# Patient Record
Sex: Male | Born: 1980 | Race: White | Hispanic: No | Marital: Single | Smoking: Never smoker
Health system: Southern US, Community
[De-identification: ages and names within clinical notes are randomized; demographics above are authoritative.]

## PROBLEM LIST (undated history)

## (undated) DIAGNOSIS — Z87442 Personal history of urinary calculi: Secondary | ICD-10-CM

## (undated) DIAGNOSIS — D649 Anemia, unspecified: Secondary | ICD-10-CM

## (undated) DIAGNOSIS — Z8719 Personal history of other diseases of the digestive system: Secondary | ICD-10-CM

## (undated) DIAGNOSIS — Z803 Family history of malignant neoplasm of breast: Secondary | ICD-10-CM

## (undated) DIAGNOSIS — T7840XA Allergy, unspecified, initial encounter: Secondary | ICD-10-CM

## (undated) DIAGNOSIS — Z800 Family history of malignant neoplasm of digestive organs: Secondary | ICD-10-CM

## (undated) DIAGNOSIS — N200 Calculus of kidney: Secondary | ICD-10-CM

## (undated) DIAGNOSIS — Z8049 Family history of malignant neoplasm of other genital organs: Secondary | ICD-10-CM

## (undated) DIAGNOSIS — I10 Essential (primary) hypertension: Secondary | ICD-10-CM

## (undated) DIAGNOSIS — M109 Gout, unspecified: Secondary | ICD-10-CM

## (undated) DIAGNOSIS — D509 Iron deficiency anemia, unspecified: Secondary | ICD-10-CM

## (undated) DIAGNOSIS — Z808 Family history of malignant neoplasm of other organs or systems: Secondary | ICD-10-CM

## (undated) DIAGNOSIS — K219 Gastro-esophageal reflux disease without esophagitis: Secondary | ICD-10-CM

## (undated) DIAGNOSIS — K2270 Barrett's esophagus without dysplasia: Secondary | ICD-10-CM

## (undated) DIAGNOSIS — F419 Anxiety disorder, unspecified: Secondary | ICD-10-CM

## (undated) DIAGNOSIS — E669 Obesity, unspecified: Secondary | ICD-10-CM

## (undated) DIAGNOSIS — K5790 Diverticulosis of intestine, part unspecified, without perforation or abscess without bleeding: Secondary | ICD-10-CM

## (undated) HISTORY — DX: Calculus of kidney: N20.0

## (undated) HISTORY — DX: Iron deficiency anemia, unspecified: D50.9

## (undated) HISTORY — PX: CERVICAL FUSION: SHX112

## (undated) HISTORY — PX: BARIATRIC SURGERY: SHX1103

## (undated) HISTORY — DX: Family history of malignant neoplasm of other genital organs: Z80.49

## (undated) HISTORY — PX: UPPER GASTROINTESTINAL ENDOSCOPY: SHX188

## (undated) HISTORY — DX: Family history of malignant neoplasm of breast: Z80.3

## (undated) HISTORY — DX: Family history of malignant neoplasm of other organs or systems: Z80.8

## (undated) HISTORY — DX: Anemia, unspecified: D64.9

## (undated) HISTORY — DX: Barrett's esophagus without dysplasia: K22.70

## (undated) HISTORY — DX: Allergy, unspecified, initial encounter: T78.40XA

## (undated) HISTORY — DX: Family history of malignant neoplasm of digestive organs: Z80.0

## (undated) HISTORY — PX: CHOLECYSTECTOMY: SHX55

## (undated) HISTORY — PX: LASIK: SHX215

---
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
## 2005-06-02 ENCOUNTER — Encounter (HOSPITAL_COMMUNITY): Admission: RE | Admit: 2005-06-02 | Discharge: 2005-07-02 | Payer: Self-pay | Admitting: Internal Medicine

## 2006-06-15 ENCOUNTER — Ambulatory Visit (HOSPITAL_COMMUNITY): Admission: RE | Admit: 2006-06-15 | Discharge: 2006-06-15 | Payer: Self-pay | Admitting: Internal Medicine

## 2007-08-29 ENCOUNTER — Emergency Department: Payer: Self-pay | Admitting: Emergency Medicine

## 2007-12-27 ENCOUNTER — Emergency Department: Payer: Self-pay | Admitting: Emergency Medicine

## 2008-01-07 ENCOUNTER — Ambulatory Visit: Payer: Self-pay | Admitting: Urology

## 2008-01-13 ENCOUNTER — Ambulatory Visit: Payer: Self-pay | Admitting: Urology

## 2008-01-25 ENCOUNTER — Ambulatory Visit: Payer: Self-pay | Admitting: Urology

## 2008-03-20 ENCOUNTER — Ambulatory Visit: Payer: Self-pay | Admitting: Internal Medicine

## 2008-08-14 ENCOUNTER — Inpatient Hospital Stay: Payer: Self-pay | Admitting: Surgery

## 2009-01-30 ENCOUNTER — Ambulatory Visit: Payer: Self-pay | Admitting: Urology

## 2009-02-03 ENCOUNTER — Emergency Department: Payer: Self-pay | Admitting: Emergency Medicine

## 2009-02-11 ENCOUNTER — Observation Stay: Payer: Self-pay | Admitting: Internal Medicine

## 2009-02-27 ENCOUNTER — Ambulatory Visit: Payer: Self-pay | Admitting: Cardiology

## 2009-06-14 ENCOUNTER — Ambulatory Visit: Payer: Self-pay | Admitting: Urology

## 2009-10-20 DIAGNOSIS — Z22322 Carrier or suspected carrier of Methicillin resistant Staphylococcus aureus: Secondary | ICD-10-CM

## 2009-10-20 HISTORY — DX: Carrier or suspected carrier of methicillin resistant Staphylococcus aureus: Z22.322

## 2010-03-26 ENCOUNTER — Ambulatory Visit: Payer: Self-pay | Admitting: Internal Medicine

## 2010-04-19 ENCOUNTER — Ambulatory Visit: Payer: Self-pay | Admitting: Internal Medicine

## 2010-05-07 ENCOUNTER — Ambulatory Visit: Payer: Self-pay | Admitting: General Practice

## 2010-05-14 ENCOUNTER — Ambulatory Visit (HOSPITAL_COMMUNITY): Admission: RE | Admit: 2010-05-14 | Discharge: 2010-05-15 | Payer: Self-pay | Admitting: Neurosurgery

## 2010-05-20 ENCOUNTER — Ambulatory Visit: Payer: Self-pay | Admitting: Internal Medicine

## 2011-01-04 LAB — URINALYSIS, ROUTINE W REFLEX MICROSCOPIC
Bilirubin Urine: NEGATIVE
Hgb urine dipstick: NEGATIVE
Urobilinogen, UA: 0.2 mg/dL (ref 0.0–1.0)
pH: 8 (ref 5.0–8.0)

## 2011-01-04 LAB — URINE MICROSCOPIC-ADD ON

## 2011-01-04 LAB — BASIC METABOLIC PANEL
BUN: 7 mg/dL (ref 6–23)
CO2: 25 mEq/L (ref 19–32)
Creatinine, Ser: 0.74 mg/dL (ref 0.4–1.5)
GFR calc non Af Amer: >60 mL/min (ref >60)
Potassium: 4.1 mEq/L (ref 3.5–5.1)
Sodium: 140 mEq/L (ref 135–145)

## 2011-01-04 LAB — PROTIME-INR
INR: 1.03 (ref 0.00–1.49)
Prothrombin Time: 13.4 seconds (ref 11.6–15.2)

## 2011-01-04 LAB — CBC
MCHC: 33.5 g/dL (ref 30.0–36.0)
RBC: 5.2 MIL/uL (ref 4.22–5.81)

## 2011-09-01 ENCOUNTER — Emergency Department: Payer: Self-pay | Admitting: Emergency Medicine

## 2011-12-15 ENCOUNTER — Ambulatory Visit: Payer: Self-pay

## 2011-12-24 ENCOUNTER — Ambulatory Visit: Payer: Self-pay

## 2012-12-09 ENCOUNTER — Ambulatory Visit: Payer: Self-pay | Admitting: Specialist

## 2012-12-09 LAB — CBC WITH DIFFERENTIAL/PLATELET
HCT: 46 % (ref 40.0–52.0)
HGB: 15 g/dL (ref 13.0–18.0)
Lymphocyte #: 3.8 10*3/uL — ABNORMAL HIGH (ref 1.0–3.6)
Lymphocyte %: 28.6 %
MCH: 27.5 pg (ref 26.0–34.0)
MCHC: 32.6 g/dL (ref 32.0–36.0)

## 2012-12-09 LAB — COMPREHENSIVE METABOLIC PANEL
Albumin: 4.2 g/dL (ref 3.4–5.0)
Calcium, Total: 9.4 mg/dL (ref 8.5–10.1)
Co2: 25 mmol/L (ref 21–32)
Creatinine: 0.93 mg/dL (ref 0.60–1.30)
EGFR (African American): >60
EGFR (Non-African Amer.): >60
Osmolality: 280 (ref 275–301)
Potassium: 4.2 mmol/L (ref 3.5–5.1)
SGPT (ALT): 50 U/L (ref 12–78)
Total Protein: 8.8 g/dL — ABNORMAL HIGH (ref 6.4–8.2)

## 2012-12-09 LAB — HEMOGLOBIN A1C: Hemoglobin A1C: 5.8 % (ref 4.2–6.3)

## 2012-12-09 LAB — TSH: Thyroid Stimulating Horm: 3.13 u[IU]/mL

## 2012-12-09 LAB — FOLATE: Folic Acid: 10.7 ng/mL (ref 3.1–100.0)

## 2012-12-09 LAB — MAGNESIUM: Magnesium: 1.9 mg/dL

## 2012-12-09 LAB — BILIRUBIN, DIRECT: Bilirubin, Direct: 0.1 mg/dL (ref 0.00–0.20)

## 2012-12-09 LAB — IRON AND TIBC
Iron Bind.Cap.(Total): 473 ug/dL — ABNORMAL HIGH (ref 250–450)
Iron: 59 ug/dL — ABNORMAL LOW (ref 65–175)

## 2012-12-09 LAB — FERRITIN: Ferritin (ARMC): 51 ng/mL (ref 8–388)

## 2012-12-09 LAB — LIPASE, BLOOD: Lipase: 96 U/L (ref 73–393)

## 2012-12-09 LAB — PROTIME-INR: INR: 1

## 2012-12-18 ENCOUNTER — Ambulatory Visit: Payer: Self-pay | Admitting: Specialist

## 2013-01-18 ENCOUNTER — Ambulatory Visit: Payer: Self-pay | Admitting: Specialist

## 2013-01-31 ENCOUNTER — Ambulatory Visit: Payer: Self-pay | Admitting: Gastroenterology

## 2013-02-02 LAB — PATHOLOGY REPORT

## 2013-02-17 ENCOUNTER — Ambulatory Visit: Payer: Self-pay | Admitting: Specialist

## 2013-03-23 ENCOUNTER — Ambulatory Visit: Payer: Self-pay | Admitting: Gastroenterology

## 2013-05-02 DIAGNOSIS — I10 Essential (primary) hypertension: Secondary | ICD-10-CM | POA: Insufficient documentation

## 2013-08-10 ENCOUNTER — Other Ambulatory Visit: Payer: Self-pay | Admitting: Specialist

## 2013-09-29 ENCOUNTER — Ambulatory Visit: Payer: Self-pay | Admitting: Urology

## 2013-10-18 ENCOUNTER — Other Ambulatory Visit: Payer: Self-pay | Admitting: Specialist

## 2013-10-18 ENCOUNTER — Ambulatory Visit: Payer: Self-pay | Admitting: General Practice

## 2013-10-18 LAB — COMPREHENSIVE METABOLIC PANEL
Albumin: 3.9 g/dL (ref 3.4–5.0)
EGFR (African American): >60
EGFR (Non-African Amer.): >60
Glucose: 96 mg/dL (ref 65–99)
Osmolality: 282 (ref 275–301)
Potassium: 3.9 mmol/L (ref 3.5–5.1)
SGOT(AST): 20 U/L (ref 15–37)
Sodium: 141 mmol/L (ref 136–145)

## 2013-10-18 LAB — MAGNESIUM: Magnesium: 2 mg/dL

## 2013-10-18 LAB — CBC WITH DIFFERENTIAL/PLATELET
Basophil #: 0.1 10*3/uL (ref 0.0–0.1)
Lymphocyte #: 3.6 10*3/uL (ref 1.0–3.6)
Monocyte #: 0.9 x10 3/mm (ref 0.2–1.0)
Neutrophil %: 61.9 %
RDW: 13.2 % (ref 11.5–14.5)

## 2013-10-18 LAB — FOLATE: Folic Acid: 14.7 ng/mL (ref 3.1–100.0)

## 2013-10-18 LAB — TSH: Thyroid Stimulating Horm: 3.37 u[IU]/mL

## 2013-10-18 LAB — IRON: Iron: 67 ug/dL (ref 65–175)

## 2013-10-18 LAB — PHOSPHORUS: Phosphorus: 3.7 mg/dL (ref 2.5–4.9)

## 2013-12-26 ENCOUNTER — Emergency Department: Payer: Self-pay | Admitting: Emergency Medicine

## 2013-12-26 LAB — COMPREHENSIVE METABOLIC PANEL
ALBUMIN: 3.7 g/dL (ref 3.4–5.0)
ALT: 34 U/L (ref 12–78)
AST: 37 U/L (ref 15–37)
Alkaline Phosphatase: 62 U/L
Anion Gap: 4 — ABNORMAL LOW (ref 7–16)
BUN: 8 mg/dL (ref 7–18)
Bilirubin,Total: 0.9 mg/dL (ref 0.2–1.0)
CREATININE: 1 mg/dL (ref 0.60–1.30)
Calcium, Total: 8.6 mg/dL (ref 8.5–10.1)
Chloride: 109 mmol/L — ABNORMAL HIGH (ref 98–107)
Co2: 24 mmol/L (ref 21–32)
EGFR (African American): >60
Glucose: 91 mg/dL (ref 65–99)
Osmolality: 272 (ref 275–301)
Potassium: 4.1 mmol/L (ref 3.5–5.1)
Sodium: 137 mmol/L (ref 136–145)
Total Protein: 7.5 g/dL (ref 6.4–8.2)

## 2013-12-26 LAB — CBC
HCT: 48.7 % (ref 40.0–52.0)
HGB: 16.1 g/dL (ref 13.0–18.0)
MCH: 28.1 pg (ref 26.0–34.0)
MCHC: 33 g/dL (ref 32.0–36.0)
MCV: 85 fL (ref 80–100)
Platelet: 203 10*3/uL (ref 150–440)
RBC: 5.72 10*6/uL (ref 4.40–5.90)
RDW: 13.5 % (ref 11.5–14.5)
WBC: 13.3 10*3/uL — ABNORMAL HIGH (ref 3.8–10.6)

## 2013-12-26 LAB — CLOSTRIDIUM DIFFICILE(ARMC)

## 2014-01-04 ENCOUNTER — Ambulatory Visit: Payer: Self-pay | Admitting: Gastroenterology

## 2014-01-04 LAB — CREATININE, SERUM
Creatinine: 1.06 mg/dL (ref 0.60–1.30)
EGFR (Non-African Amer.): >60

## 2014-02-08 DIAGNOSIS — N200 Calculus of kidney: Secondary | ICD-10-CM

## 2014-02-08 HISTORY — DX: Calculus of kidney: N20.0

## 2014-05-02 ENCOUNTER — Other Ambulatory Visit: Payer: Self-pay | Admitting: Specialist

## 2014-05-02 LAB — PHOSPHORUS: Phosphorus: 3.4 mg/dL (ref 2.5–4.9)

## 2014-05-02 LAB — IRON AND TIBC
IRON BIND. CAP.(TOTAL): 531 ug/dL — AB (ref 250–450)
IRON SATURATION: 11 %
Iron: 61 ug/dL — ABNORMAL LOW (ref 65–175)
Unbound Iron-Bind.Cap.: 470 ug/dL

## 2014-05-02 LAB — COMPREHENSIVE METABOLIC PANEL
ANION GAP: 8 (ref 7–16)
Albumin: 3.9 g/dL (ref 3.4–5.0)
Alkaline Phosphatase: 61 U/L
BUN: 14 mg/dL (ref 7–18)
Bilirubin,Total: 0.6 mg/dL (ref 0.2–1.0)
CALCIUM: 9.1 mg/dL (ref 8.5–10.1)
CHLORIDE: 103 mmol/L (ref 98–107)
CO2: 27 mmol/L (ref 21–32)
Creatinine: 0.86 mg/dL (ref 0.60–1.30)
EGFR (African American): >60
Glucose: 97 mg/dL (ref 65–99)
Osmolality: 276 (ref 275–301)
Potassium: 3.7 mmol/L (ref 3.5–5.1)
SGOT(AST): 29 U/L (ref 15–37)
SGPT (ALT): 40 U/L (ref 12–78)
Sodium: 138 mmol/L (ref 136–145)
TOTAL PROTEIN: 8.2 g/dL (ref 6.4–8.2)

## 2014-05-02 LAB — CBC WITH DIFFERENTIAL/PLATELET
BASOS ABS: 0.1 10*3/uL (ref 0.0–0.1)
BASOS PCT: 0.7 %
EOS PCT: 0.9 %
Eosinophil #: 0.1 10*3/uL (ref 0.0–0.7)
HCT: 48.3 % (ref 40.0–52.0)
HGB: 15.7 g/dL (ref 13.0–18.0)
LYMPHS ABS: 4.3 10*3/uL — AB (ref 1.0–3.6)
LYMPHS PCT: 30.9 %
MCH: 26.7 pg (ref 26.0–34.0)
MCHC: 32.5 g/dL (ref 32.0–36.0)
MCV: 82 fL (ref 80–100)
MONO ABS: 0.9 x10 3/mm (ref 0.2–1.0)
MONOS PCT: 6.7 %
NEUTROS ABS: 8.5 10*3/uL — AB (ref 1.4–6.5)
Neutrophil %: 60.8 %
Platelet: 339 10*3/uL (ref 150–440)
RBC: 5.86 10*6/uL (ref 4.40–5.90)
RDW: 13.5 % (ref 11.5–14.5)
WBC: 14 10*3/uL — AB (ref 3.8–10.6)

## 2014-05-02 LAB — AMYLASE: AMYLASE: 63 U/L (ref 25–115)

## 2014-05-02 LAB — MAGNESIUM: Magnesium: 1.9 mg/dL

## 2014-05-02 LAB — FOLATE: Folic Acid: 15.6 ng/mL (ref 3.1–100.0)

## 2014-05-02 LAB — FERRITIN: Ferritin (ARMC): 10 ng/mL (ref 8–388)

## 2014-09-18 ENCOUNTER — Ambulatory Visit: Payer: Self-pay | Admitting: Specialist

## 2015-01-23 ENCOUNTER — Encounter: Payer: Self-pay | Admitting: *Deleted

## 2015-03-29 ENCOUNTER — Other Ambulatory Visit
Admission: RE | Admit: 2015-03-29 | Discharge: 2015-03-29 | Disposition: A | Discharge status: Home / Self Care | Payer: 59 | Source: Ambulatory Visit | Attending: Urology | Admitting: Urology

## 2015-03-29 ENCOUNTER — Other Ambulatory Visit: Payer: Self-pay | Admitting: Urology

## 2015-03-29 ENCOUNTER — Ambulatory Visit
Admission: RE | Admit: 2015-03-29 | Discharge: 2015-03-29 | Disposition: A | Discharge status: Home / Self Care | Payer: 59 | Source: Ambulatory Visit | Attending: Urology | Admitting: Urology

## 2015-03-29 DIAGNOSIS — N200 Calculus of kidney: Secondary | ICD-10-CM

## 2015-03-29 LAB — BASIC METABOLIC PANEL
ANION GAP: 9 (ref 5–15)
BUN: 13 mg/dL (ref 6–20)
CALCIUM: 9.3 mg/dL (ref 8.9–10.3)
CO2: 28 mmol/L (ref 22–32)
Chloride: 103 mmol/L (ref 101–111)
Creatinine, Ser: 1.16 mg/dL (ref 0.61–1.24)
GFR calc non Af Amer: >60 mL/min (ref >60)
GLUCOSE: 108 mg/dL — AB (ref 65–99)
Potassium: 3.5 mmol/L (ref 3.5–5.1)
SODIUM: 140 mmol/L (ref 135–145)

## 2015-03-29 LAB — PSA: PSA: 0.76 ng/mL (ref 0.00–4.00)

## 2015-03-29 LAB — HEMATOCRIT: HCT: 43.7 % (ref 40.0–52.0)

## 2015-03-30 LAB — TESTOSTERONE: Testosterone: 863 ng/dL (ref 348–1197)

## 2015-08-30 ENCOUNTER — Other Ambulatory Visit
Admission: RE | Admit: 2015-08-30 | Discharge: 2015-08-30 | Disposition: A | Discharge status: Home / Self Care | Payer: 59 | Source: Ambulatory Visit | Attending: Family Medicine | Admitting: Family Medicine

## 2015-08-30 DIAGNOSIS — Z139 Encounter for screening, unspecified: Secondary | ICD-10-CM | POA: Diagnosis not present

## 2015-08-30 LAB — CBC
HCT: 36.5 % — ABNORMAL LOW (ref 40.0–52.0)
Hemoglobin: 11.5 g/dL — ABNORMAL LOW (ref 13.0–18.0)
MCH: 22.4 pg — AB (ref 26.0–34.0)
MCHC: 31.5 g/dL — ABNORMAL LOW (ref 32.0–36.0)
MCV: 71.1 fL — ABNORMAL LOW (ref 80.0–100.0)
Platelets: 411 10*3/uL (ref 150–440)
RBC: 5.12 MIL/uL (ref 4.40–5.90)
RDW: 17.6 % — ABNORMAL HIGH (ref 11.5–14.5)
WBC: 10.4 10*3/uL (ref 3.8–10.6)

## 2015-10-24 ENCOUNTER — Encounter: Payer: Self-pay | Admitting: Physician Assistant

## 2015-10-24 ENCOUNTER — Ambulatory Visit: Payer: Self-pay | Admitting: Physician Assistant

## 2015-10-24 VITALS — BP 180/100 | HR 74 | Temp 99.2°F

## 2015-10-24 DIAGNOSIS — K219 Gastro-esophageal reflux disease without esophagitis: Secondary | ICD-10-CM

## 2015-10-24 DIAGNOSIS — Z0000 Encounter for general adult medical examination without abnormal findings: Secondary | ICD-10-CM

## 2015-10-24 DIAGNOSIS — R300 Dysuria: Secondary | ICD-10-CM

## 2015-10-24 LAB — POCT URINALYSIS DIPSTICK
Bilirubin, UA: NEGATIVE
Glucose, UA: NEGATIVE
Ketones, UA: NEGATIVE
Leukocytes, UA: NEGATIVE
Nitrite, UA: NEGATIVE
PH UA: 6
PROTEIN UA: NEGATIVE
SPEC GRAV UA: 1.025
Urobilinogen, UA: 0.2

## 2015-10-24 MED ORDER — PANTOPRAZOLE SODIUM 40 MG PO TBEC: 40.0000 mg | DELAYED_RELEASE_TABLET | Freq: Every day | ORAL | Status: DC

## 2015-10-24 NOTE — Progress Notes (Signed)
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

## 2015-11-17 ENCOUNTER — Other Ambulatory Visit
Admission: AD | Admit: 2015-11-17 | Discharge: 2015-11-17 | Disposition: A | Discharge status: Home / Self Care | Payer: 59 | Source: Ambulatory Visit | Attending: Physician Assistant | Admitting: Physician Assistant

## 2015-11-17 DIAGNOSIS — E291 Testicular hypofunction: Secondary | ICD-10-CM | POA: Diagnosis not present

## 2015-11-17 DIAGNOSIS — Z0000 Encounter for general adult medical examination without abnormal findings: Secondary | ICD-10-CM | POA: Diagnosis not present

## 2015-11-17 LAB — CBC WITH DIFFERENTIAL/PLATELET
BASOS PCT: 1 %
Basophils Absolute: 0.1 10*3/uL (ref 0–0.1)
Eosinophils Absolute: 0.3 10*3/uL (ref 0–0.7)
Eosinophils Relative: 2 %
HCT: 36.9 % — ABNORMAL LOW (ref 40.0–52.0)
HEMOGLOBIN: 11.3 g/dL — AB (ref 13.0–18.0)
LYMPHS ABS: 3.3 10*3/uL (ref 1.0–3.6)
Lymphocytes Relative: 28 %
MCH: 21.2 pg — AB (ref 26.0–34.0)
MCHC: 30.8 g/dL — AB (ref 32.0–36.0)
MCV: 68.7 fL — AB (ref 80.0–100.0)
MONOS PCT: 9 %
Monocytes Absolute: 1 10*3/uL (ref 0.2–1.0)
NEUTROS ABS: 6.9 10*3/uL — AB (ref 1.4–6.5)
NEUTROS PCT: 60 %
Platelets: 424 10*3/uL (ref 150–440)
RBC: 5.36 MIL/uL (ref 4.40–5.90)
RDW: 16.6 % — AB (ref 11.5–14.5)
WBC: 11.6 10*3/uL — ABNORMAL HIGH (ref 3.8–10.6)

## 2015-11-21 DIAGNOSIS — K2270 Barrett's esophagus without dysplasia: Secondary | ICD-10-CM | POA: Diagnosis not present

## 2015-11-21 DIAGNOSIS — D509 Iron deficiency anemia, unspecified: Secondary | ICD-10-CM | POA: Diagnosis not present

## 2015-11-21 DIAGNOSIS — R1013 Epigastric pain: Secondary | ICD-10-CM | POA: Diagnosis not present

## 2016-01-10 ENCOUNTER — Encounter: Payer: Self-pay | Admitting: *Deleted

## 2016-01-11 ENCOUNTER — Ambulatory Visit
Admission: RE | Admit: 2016-01-11 | Discharge: 2016-01-11 | Disposition: A | Discharge status: Home / Self Care | Payer: 59 | Source: Ambulatory Visit | Attending: Gastroenterology | Admitting: Gastroenterology

## 2016-01-11 ENCOUNTER — Ambulatory Visit: Payer: 59 | Admitting: Anesthesiology

## 2016-01-11 ENCOUNTER — Encounter
Admission: RE | Disposition: A | Discharge status: Home / Self Care | Payer: Self-pay | Source: Ambulatory Visit | Attending: Gastroenterology

## 2016-01-11 ENCOUNTER — Other Ambulatory Visit: Payer: Self-pay | Admitting: Gastroenterology

## 2016-01-11 DIAGNOSIS — K5730 Diverticulosis of large intestine without perforation or abscess without bleeding: Secondary | ICD-10-CM | POA: Diagnosis not present

## 2016-01-11 DIAGNOSIS — Z9884 Bariatric surgery status: Secondary | ICD-10-CM | POA: Diagnosis not present

## 2016-01-11 DIAGNOSIS — K2210 Ulcer of esophagus without bleeding: Secondary | ICD-10-CM | POA: Diagnosis not present

## 2016-01-11 DIAGNOSIS — Z8719 Personal history of other diseases of the digestive system: Secondary | ICD-10-CM | POA: Insufficient documentation

## 2016-01-11 DIAGNOSIS — Z87442 Personal history of urinary calculi: Secondary | ICD-10-CM | POA: Diagnosis not present

## 2016-01-11 DIAGNOSIS — K5710 Diverticulosis of small intestine without perforation or abscess without bleeding: Secondary | ICD-10-CM

## 2016-01-11 DIAGNOSIS — K2950 Unspecified chronic gastritis without bleeding: Secondary | ICD-10-CM | POA: Diagnosis not present

## 2016-01-11 DIAGNOSIS — K2960 Other gastritis without bleeding: Secondary | ICD-10-CM | POA: Insufficient documentation

## 2016-01-11 DIAGNOSIS — E669 Obesity, unspecified: Secondary | ICD-10-CM | POA: Insufficient documentation

## 2016-01-11 DIAGNOSIS — Z79899 Other long term (current) drug therapy: Secondary | ICD-10-CM | POA: Diagnosis not present

## 2016-01-11 DIAGNOSIS — D509 Iron deficiency anemia, unspecified: Secondary | ICD-10-CM | POA: Insufficient documentation

## 2016-01-11 DIAGNOSIS — K210 Gastro-esophageal reflux disease with esophagitis: Secondary | ICD-10-CM | POA: Insufficient documentation

## 2016-01-11 DIAGNOSIS — M109 Gout, unspecified: Secondary | ICD-10-CM | POA: Diagnosis not present

## 2016-01-11 DIAGNOSIS — K224 Dyskinesia of esophagus: Secondary | ICD-10-CM | POA: Insufficient documentation

## 2016-01-11 DIAGNOSIS — Z9049 Acquired absence of other specified parts of digestive tract: Secondary | ICD-10-CM | POA: Diagnosis not present

## 2016-01-11 DIAGNOSIS — K5790 Diverticulosis of intestine, part unspecified, without perforation or abscess without bleeding: Secondary | ICD-10-CM

## 2016-01-11 DIAGNOSIS — K449 Diaphragmatic hernia without obstruction or gangrene: Secondary | ICD-10-CM | POA: Diagnosis not present

## 2016-01-11 DIAGNOSIS — Z01818 Encounter for other preprocedural examination: Secondary | ICD-10-CM | POA: Diagnosis not present

## 2016-01-11 DIAGNOSIS — Z6841 Body Mass Index (BMI) 40.0 and over, adult: Secondary | ICD-10-CM | POA: Insufficient documentation

## 2016-01-11 DIAGNOSIS — K209 Esophagitis, unspecified: Secondary | ICD-10-CM | POA: Diagnosis not present

## 2016-01-11 DIAGNOSIS — K2900 Acute gastritis without bleeding: Secondary | ICD-10-CM | POA: Diagnosis not present

## 2016-01-11 DIAGNOSIS — K2270 Barrett's esophagus without dysplasia: Secondary | ICD-10-CM | POA: Diagnosis not present

## 2016-01-11 DIAGNOSIS — K2211 Ulcer of esophagus with bleeding: Secondary | ICD-10-CM | POA: Diagnosis not present

## 2016-01-11 DIAGNOSIS — K2970 Gastritis, unspecified, without bleeding: Secondary | ICD-10-CM | POA: Diagnosis not present

## 2016-01-11 HISTORY — DX: Obesity, unspecified: E66.9

## 2016-01-11 HISTORY — DX: Gout, unspecified: M10.9

## 2016-01-11 HISTORY — PX: COLONOSCOPY WITH PROPOFOL: SHX5780

## 2016-01-11 HISTORY — PX: ESOPHAGOGASTRODUODENOSCOPY: SHX5428

## 2016-01-11 HISTORY — DX: Gastro-esophageal reflux disease without esophagitis: K21.9

## 2016-01-11 HISTORY — DX: Diverticulosis of intestine, part unspecified, without perforation or abscess without bleeding: K57.90

## 2016-01-11 SURGERY — COLONOSCOPY WITH PROPOFOL
Anesthesia: General

## 2016-01-11 MED ORDER — SODIUM CHLORIDE 0.9 % IV SOLN
INTRAVENOUS | Status: DC
Start: 2016-01-11 — End: 2016-01-11
  Administered 2016-01-11: 1000 mL via INTRAVENOUS

## 2016-01-11 MED ORDER — PROPOFOL 500 MG/50ML IV EMUL
INTRAVENOUS | Status: DC | PRN
  Administered 2016-01-11: 150 ug/kg/min via INTRAVENOUS

## 2016-01-11 MED ORDER — SODIUM CHLORIDE 0.9 % IV SOLN: INTRAVENOUS | Status: DC

## 2016-01-11 MED ORDER — EPHEDRINE SULFATE 50 MG/ML IJ SOLN
INTRAMUSCULAR | Status: DC | PRN
  Administered 2016-01-11 (×2): 10 mg via INTRAVENOUS

## 2016-01-11 MED ORDER — MIDAZOLAM HCL 2 MG/2ML IJ SOLN
INTRAMUSCULAR | Status: DC | PRN
  Administered 2016-01-11: 1 mg via INTRAVENOUS

## 2016-01-11 MED ORDER — FENTANYL CITRATE (PF) 100 MCG/2ML IJ SOLN
INTRAMUSCULAR | Status: DC | PRN
  Administered 2016-01-11 (×2): 50 ug via INTRAVENOUS

## 2016-01-11 MED ORDER — PROPOFOL 10 MG/ML IV BOLUS
INTRAVENOUS | Status: DC | PRN
  Administered 2016-01-11: 110 mg via INTRAVENOUS

## 2016-01-11 NOTE — Anesthesia Preprocedure Evaluation (Signed)
Anesthesia Evaluation  Patient identified by MRN, date of birth, ID band Patient awake    Reviewed: Allergy & Precautions, H&P , NPO status , Patient's Chart, lab work & pertinent test results, reviewed documented beta blocker date and time   Airway Mallampati: II   Neck ROM: full    Dental  (+) Teeth Intact   Pulmonary neg pulmonary ROS,    Pulmonary exam normal        Cardiovascular negative cardio ROS Normal cardiovascular exam     Neuro/Psych negative neurological ROS  negative psych ROS   GI/Hepatic negative GI ROS, Neg liver ROS, GERD  ,  Endo/Other  negative endocrine ROS  Renal/GU Renal diseasenegative Renal ROS  negative genitourinary   Musculoskeletal   Abdominal   Peds  Hematology negative hematology ROS (+)   Anesthesia Other Findings Past Medical History:   GERD (gastroesophageal reflux disease)                       Gonadotropin deficiency syndrome, male                       Gout                                                         Kidney stones                                                Obesity                                                    Past Surgical History:   COLONOSCOPY                                                   UPPER GASTROINTESTINAL ENDOSCOPY                              BARIATRIC SURGERY                                           BMI    Body Mass Index   43.34 kg/m 2     Reproductive/Obstetrics                             Anesthesia Physical Anesthesia Plan  ASA: III  Anesthesia Plan: General   Post-op Pain Management:    Induction:   Airway Management Planned:   Additional Equipment:   Intra-op Plan:   Post-operative Plan:   Informed Consent: I have reviewed the patients History and Physical, chart, labs and discussed the procedure including the risks, benefits and alternatives for the proposed  anesthesia with the patient or  authorized representative who has indicated his/her understanding and acceptance.   Dental Advisory Given  Plan Discussed with: CRNA  Anesthesia Plan Comments:         Anesthesia Quick Evaluation

## 2016-01-11 NOTE — Op Note (Signed)
[facility] Gastroenterology Patient Name: Nathaniel Curry Procedure Date: 01/11/2016 7:46 AM MRN: [id number] Account #: [id number] Date of Birth: 10-28-1980 Admit Type: Outpatient Age: 35 Room: [facility] ENDO ROOM 3 Gender: Male Note Status: Finalized Procedure:            Colonoscopy Indications:          Iron deficiency anemia Providers:            Lollie Sails, MD Referring MD:         Lavera Guise, MD (Referring MD) Medicines:            Monitored Anesthesia Care Complications:        No immediate complications. Procedure:            Pre-Anesthesia Assessment:                       - ASA Grade Assessment: III - A patient with severe                        systemic disease.                       After obtaining informed consent, the colonoscope was                        passed under direct vision. Throughout the procedure,                        the patient's blood pressure, pulse, and oxygen                        saturations were monitored continuously. The                        Colonoscope was introduced through the anus and                        advanced to the the cecum, identified by appendiceal                        orifice and ileocecal valve. The colonoscopy was                        performed without difficulty. The patient tolerated the                        procedure well. The quality of the bowel preparation                        was good. Findings:      A few small-mouthed diverticula were found in the sigmoid colon and       distal descending colon.      The exam was otherwise normal throughout the examined colon.      The retroflexed view of the distal rectum and anal verge was normal and       showed no anal or rectal abnormalities.      The digital rectal exam was normal. Impression:           - Diverticulosis in the sigmoid colon and in the distal  descending colon.                       - The distal rectum and  anal verge are normal on                        retroflexion view.                       - No specimens collected. Recommendation:       - Discharge patient to home.                       - Perform an upper GI series and small bowel follow                        through at appointment to be scheduled.                       - Return to GI clinic in 4 weeks. Procedure Code(s):    --- Professional ---                       [id number], Colonoscopy, flexible; diagnostic, including                        collection of specimen(s) by brushing or washing, when                        performed (separate procedure) Diagnosis Code(s):    --- Professional ---                       D50.9, Iron deficiency anemia, unspecified                       K57.30, Diverticulosis of large intestine without                        perforation or abscess without bleeding CPT copyright 2016 American Medical Association. All rights reserved. The codes documented in this report are preliminary and upon coder review may  be revised to meet current compliance requirements. Lollie Sails, MD 01/11/2016 8:47:18 AM This report has been signed electronically. Number of Addenda: 0 Note Initiated On: 01/11/2016 7:46 AM Scope Withdrawal Time: 0 hours 5 minutes 52 seconds  Total Procedure Duration: 0 hours 13 minutes 14 seconds       [facility]

## 2016-01-11 NOTE — Op Note (Signed)
[facility] Gastroenterology Patient Name: Nathaniel Curry Procedure Date: 01/11/2016 7:59 AM MRN: [id number] Account #: [id number] Date of Birth: 10-11-1981 Admit Type: Outpatient Age: 35 Room: [facility] ENDO ROOM 3 Gender: Male Note Status: Finalized Procedure:            Upper GI endoscopy Indications:          Gastro-esophageal reflux disease, Failure to respond to                        medical treatment Providers:            Lollie Sails, MD Referring MD:         Lavera Guise, MD (Referring MD) Medicines:            Monitored Anesthesia Care Complications:        No immediate complications. Procedure:            Pre-Anesthesia Assessment:                       - ASA Grade Assessment: III - A patient with severe                        systemic disease.                       After obtaining informed consent, the endoscope was                        passed under direct vision. Throughout the procedure,                        the patient's blood pressure, pulse, and oxygen                        saturations were monitored continuously. The Endoscope                        was introduced through the mouth, and advanced to the                        third part of duodenum. The upper GI endoscopy was                        accomplished without difficulty. The patient tolerated                        the procedure well. Findings:      LA Grade D (one or more mucosal breaks involving at least 75% of       esophageal circumference) esophagitis with stigmata of       bleeding/friability was found. Biopsies were taken with a cold forceps       for histology.      A small hiatal hernia was present.      Abnormal motility was noted at the gastroesophageal junction. The       cricopharyngeus was normal. The distal esophagus/lower esophageal       sphincter is open. Secondary peristaltic waves are noted.      Evidence of a sleeve gastrectomy was found in the gastric body.  This was       characterized by healthy appearing mucosa. Biopsies were  taken with a       cold forceps for histology.      Patchy/linear mild inflammation characterized by congestion (edema) and       erythema was found in the gastric antrum. Biopsies were taken with a       cold forceps for histology.      The examined duodenum was normal. Biopsies were taken with a cold       forceps for histology.      Retroflexion was not done due to the narrowing of the gastric vault. Impression:           - LA Grade D erosive esophagitis. Biopsied.                       - Small hiatal hernia.                       - Abnormal esophageal motility.                       - A sleeve gastrectomy was found, characterized by                        healthy appearing mucosa. Biopsied.                       - Erosive gastritis. Biopsied.                       - Normal examined duodenum. Biopsied. Recommendation:       - Use Aciphex (rabeprazole) 20 mg PO BID daily.                       - Use sucralfate tablets 1 gram PO QID daily.                       - Await pathology results.                       - Return to GI clinic in 4 weeks. Procedure Code(s):    --- Professional ---                       [phone], Esophagogastroduodenoscopy, flexible, transoral;                        with biopsy, single or multiple Diagnosis Code(s):    --- Professional ---                       K20.8, Other esophagitis                       K44.9, Diaphragmatic hernia without obstruction or                        gangrene                       K22.4, Dyskinesia of esophagus                       Z98.84, Bariatric surgery status  K29.60, Other gastritis without bleeding                       K21.9, Gastro-esophageal reflux disease without                        esophagitis CPT copyright 2016 American Medical Association. All rights reserved. The codes documented in this report are preliminary and upon coder  review may  be revised to meet current compliance requirements. Lollie Sails, MD 01/11/2016 8:27:58 AM This report has been signed electronically. Number of Addenda: 0 Note Initiated On: 01/11/2016 7:59 AM      [facility]

## 2016-01-11 NOTE — Transfer of Care (Signed)
Immediate Anesthesia Transfer of Care Note  Patient: Nathaniel Curry  Procedure(s) Performed: Procedure(s): COLONOSCOPY WITH PROPOFOL (N/A) ESOPHAGOGASTRODUODENOSCOPY (EGD)  Patient Location: PACU and Endoscopy Unit  Anesthesia Type:General  Level of Consciousness: awake, alert  and oriented  Airway & Oxygen Therapy: Patient Spontanous Breathing and Patient connected to nasal cannula oxygen  Post-op Assessment: Report given to RN and Post -op Vital signs reviewed and stable  Post vital signs: Reviewed and stable  Last Vitals:  Filed Vitals:   01/11/16 0850 01/11/16 0851  BP: 99/54 99/54  Pulse: 79 79  Temp: 35.8 C 36.5 C  Resp: 18 20    Complications: No apparent anesthesia complications

## 2016-01-11 NOTE — Anesthesia Postprocedure Evaluation (Signed)
Anesthesia Post Note  Patient: EDEN MANETTA  Procedure(s) Performed: Procedure(s) (LRB): COLONOSCOPY WITH PROPOFOL (N/A) ESOPHAGOGASTRODUODENOSCOPY (EGD)  Patient location during evaluation: PACU Anesthesia Type: General Level of consciousness: awake and alert Pain management: pain level controlled Vital Signs Assessment: post-procedure vital signs reviewed and stable Respiratory status: spontaneous breathing, nonlabored ventilation, respiratory function stable and patient connected to nasal cannula oxygen Cardiovascular status: blood pressure returned to baseline and stable Postop Assessment: no signs of nausea or vomiting Anesthetic complications: no    Last Vitals:  Filed Vitals:   01/11/16 0910 01/11/16 0920  BP: 136/79 139/81  Pulse: 76 73  Temp:    Resp: 16 15    Last Pain: There were no vitals filed for this visit.               Molli Barrows

## 2016-01-11 NOTE — H&P (Signed)
Outpatient short stay form Pre-procedure 01/11/2016 7:45 AM Nathaniel Sails MD  Primary Physician: Dr. Bernardo Heater  Reason for visit:  EGD and colonoscopy  History of present illness:  Patient is a 35 year old male presenting today for EGD and colonoscopy. He has a recent history of development of iron deficiency anemia. He does have a history of a gastric sleeve. He is also had a lot of increased reflux symptoms. He does have a personal history of cholecystectomy as well. He has undertaken measures such as elevating head of the bed etc. which have not been beneficial. He is currently taking combination PPI and H2 receptor antagonists. He takes no aspirin or blood thinning agents.  He tolerated his prep well.    Current facility-administered medications:  .  0.9 %  sodium chloride infusion, , Intravenous, Continuous, Nathaniel Sails, MD, Last Rate: 20 mL/hr at 01/11/16 0720, 1,000 mL at 01/11/16 0720 .  0.9 %  sodium chloride infusion, , Intravenous, Continuous, Nathaniel Sails, MD  Prescriptions prior to admission  Medication Sig Dispense Refill Last Dose  . allopurinol (ZYLOPRIM) 100 MG tablet Take 100 mg by mouth daily.   Past Week at Unknown time  . clomiPHENE (CLOMID) 50 MG tablet Take 25 mg by mouth daily.   Past Week at Unknown time  . cyanocobalamin 2000 MCG tablet Take 2,000 mcg by mouth daily.   Past Week at Unknown time  . dexlansoprazole (DEXILANT) 60 MG capsule Take 60 mg by mouth daily.   Past Week at Unknown time  . ferrous gluconate (FERGON) 324 MG tablet Take 324 mg by mouth daily with breakfast.   Past Week at Unknown time  . indapamide (LOZOL) 2.5 MG tablet Take 2.5 mg by mouth daily.   Past Week at Unknown time  . Multiple Vitamins-Minerals (MULTIVITAMIN ADULT PO) Take by mouth.   Past Week at Unknown time  . pantoprazole (PROTONIX) 40 MG tablet Take 1 tablet (40 mg total) by mouth daily. 30 tablet 3 Past Week at Unknown time     No Known Allergies   Past  Medical History  Diagnosis Date  . GERD (gastroesophageal reflux disease)   . Gonadotropin deficiency syndrome, male   . Gout   . Kidney stones   . Obesity     Review of systems:      Physical Exam    Heart and lungs: Regular rate and rhythm without rub or gallop, lungs are bilaterally clear.    HEENT: Normal cephalic atraumatic eyes are anicteric    Other:     Pertinant exam for procedure: Soft nontender nondistended bowel sounds positive normoactive    Planned proceedures: EGD and colonoscopy with indicated procedures. I have discussed the risks benefits and complications of procedures to include not limited to bleeding, infection, perforation and the risk of sedation and the patient wishes to proceed.    Nathaniel Sails, MD Gastroenterology 01/11/2016  7:45 AM

## 2016-01-12 ENCOUNTER — Encounter: Payer: Self-pay | Admitting: Gastroenterology

## 2016-01-14 LAB — SURGICAL PATHOLOGY

## 2016-01-16 ENCOUNTER — Ambulatory Visit
Admission: RE | Admit: 2016-01-16 | Discharge: 2016-01-16 | Disposition: A | Discharge status: Home / Self Care | Payer: 59 | Source: Ambulatory Visit | Attending: Gastroenterology | Admitting: Gastroenterology

## 2016-01-16 DIAGNOSIS — K449 Diaphragmatic hernia without obstruction or gangrene: Secondary | ICD-10-CM | POA: Diagnosis not present

## 2016-01-16 DIAGNOSIS — K2270 Barrett's esophagus without dysplasia: Secondary | ICD-10-CM | POA: Diagnosis not present

## 2016-01-16 DIAGNOSIS — K5710 Diverticulosis of small intestine without perforation or abscess without bleeding: Secondary | ICD-10-CM | POA: Diagnosis not present

## 2016-01-16 DIAGNOSIS — K219 Gastro-esophageal reflux disease without esophagitis: Secondary | ICD-10-CM | POA: Diagnosis not present

## 2016-02-07 DIAGNOSIS — D500 Iron deficiency anemia secondary to blood loss (chronic): Secondary | ICD-10-CM | POA: Diagnosis not present

## 2016-02-07 DIAGNOSIS — K210 Gastro-esophageal reflux disease with esophagitis: Secondary | ICD-10-CM | POA: Diagnosis not present

## 2016-02-07 DIAGNOSIS — R1013 Epigastric pain: Secondary | ICD-10-CM | POA: Diagnosis not present

## 2016-02-07 DIAGNOSIS — K224 Dyskinesia of esophagus: Secondary | ICD-10-CM | POA: Diagnosis not present

## 2016-03-13 DIAGNOSIS — D509 Iron deficiency anemia, unspecified: Secondary | ICD-10-CM | POA: Diagnosis not present

## 2016-07-09 DIAGNOSIS — J3501 Chronic tonsillitis: Secondary | ICD-10-CM | POA: Diagnosis not present

## 2016-07-09 DIAGNOSIS — J358 Other chronic diseases of tonsils and adenoids: Secondary | ICD-10-CM | POA: Diagnosis not present

## 2016-07-09 DIAGNOSIS — R070 Pain in throat: Secondary | ICD-10-CM | POA: Diagnosis not present

## 2016-07-31 ENCOUNTER — Encounter
Admission: RE | Admit: 2016-07-31 | Discharge: 2016-07-31 | Disposition: A | Discharge status: Home / Self Care | Payer: 59 | Source: Ambulatory Visit | Attending: Otolaryngology | Admitting: Otolaryngology

## 2016-07-31 HISTORY — DX: Personal history of urinary calculi: Z87.442

## 2016-07-31 HISTORY — DX: Essential (primary) hypertension: I10

## 2016-07-31 NOTE — Patient Instructions (Signed)
  Your procedure is scheduled on:08/06/16 Report to Day Surgery. [facility]OND FLOOR To find out your arrival time please call [phone] between 1PM - 3PM on 08/05/16  Remember: Instructions that are not followed completely may result in serious medical risk, up to and including death, or upon the discretion of your surgeon and anesthesiologist your surgery may need to be rescheduled.    _X___ 1. Do not eat food or drink liquids after midnight. No gum chewing or hard candies.     __X__ 2. No Alcohol for 24 hours before or after surgery.   __X__ 3. Do Not Smoke For 24 Hours Prior to Your Surgery.   ____ 4. Bring all medications with you on the day of surgery if instructed.    X____ 5. Notify your doctor if there is any change in your medical condition     (cold, fever, infections).       Do not wear jewelry, make-up, hairpins, clips or nail polish.  Do not wear lotions, powders, or perfumes. You may wear deodorant.  Do not shave 48 hours prior to surgery. Men may shave face and neck.  Do not bring valuables to the hospital.    [facility] is not responsible for any belongings or valuables.               Contacts, dentures or bridgework may not be worn into surgery.  Leave your suitcase in the car. After surgery it may be brought to your room.  For patients admitted to the hospital, discharge time is determined by your                treatment team.   Patients discharged the day of surgery will not be allowed to drive home.   ____ Take these medicines the morning of surgery with A SIP OF WATER:    1. NONE  2.   3.   4.  5.  6.  ____ Fleet Enema (as directed)   ____ Use CHG Soap as directed  ____ Use inhalers on the day of surgery  ____ Stop metformin 2 days prior to surgery    ____ Take 1/2 of usual insulin dose the night before surgery and none on the morning of surgery.   ____ Stop Coumadin/Plavix/aspirin on   ____ Stop Anti-inflammatories on    ____  Stop supplements until after surgery.    ____ Bring C-Pap to the hospital.

## 2016-08-06 ENCOUNTER — Ambulatory Visit
Admission: RE | Admit: 2016-08-06 | Discharge: 2016-08-06 | Disposition: A | Discharge status: Home / Self Care | Payer: 59 | Source: Ambulatory Visit | Attending: Otolaryngology | Admitting: Otolaryngology

## 2016-08-06 ENCOUNTER — Encounter: Payer: Self-pay | Admitting: *Deleted

## 2016-08-06 ENCOUNTER — Ambulatory Visit: Payer: 59 | Admitting: Anesthesiology

## 2016-08-06 ENCOUNTER — Encounter
Admission: RE | Disposition: A | Discharge status: Home / Self Care | Payer: Self-pay | Source: Ambulatory Visit | Attending: Otolaryngology

## 2016-08-06 DIAGNOSIS — Z9884 Bariatric surgery status: Secondary | ICD-10-CM | POA: Insufficient documentation

## 2016-08-06 DIAGNOSIS — Z9049 Acquired absence of other specified parts of digestive tract: Secondary | ICD-10-CM | POA: Diagnosis not present

## 2016-08-06 DIAGNOSIS — E669 Obesity, unspecified: Secondary | ICD-10-CM | POA: Insufficient documentation

## 2016-08-06 DIAGNOSIS — Z8249 Family history of ischemic heart disease and other diseases of the circulatory system: Secondary | ICD-10-CM | POA: Diagnosis not present

## 2016-08-06 DIAGNOSIS — I10 Essential (primary) hypertension: Secondary | ICD-10-CM | POA: Insufficient documentation

## 2016-08-06 DIAGNOSIS — J358 Other chronic diseases of tonsils and adenoids: Secondary | ICD-10-CM | POA: Insufficient documentation

## 2016-08-06 DIAGNOSIS — J3501 Chronic tonsillitis: Secondary | ICD-10-CM | POA: Diagnosis not present

## 2016-08-06 DIAGNOSIS — Z809 Family history of malignant neoplasm, unspecified: Secondary | ICD-10-CM | POA: Diagnosis not present

## 2016-08-06 DIAGNOSIS — K219 Gastro-esophageal reflux disease without esophagitis: Secondary | ICD-10-CM | POA: Diagnosis not present

## 2016-08-06 DIAGNOSIS — Z833 Family history of diabetes mellitus: Secondary | ICD-10-CM | POA: Diagnosis not present

## 2016-08-06 HISTORY — PX: TONSILLECTOMY: SHX5217

## 2016-08-06 SURGERY — TONSILLECTOMY
Anesthesia: General

## 2016-08-06 MED ORDER — ACETAMINOPHEN 10 MG/ML IV SOLN
INTRAVENOUS | Status: AC
  Filled 2016-08-06: qty 100

## 2016-08-06 MED ORDER — BUPIVACAINE-EPINEPHRINE 0.25% -1:200000 IJ SOLN
INTRAMUSCULAR | Status: DC | PRN
  Administered 2016-08-06: 5 mL

## 2016-08-06 MED ORDER — FENTANYL CITRATE (PF) 100 MCG/2ML IJ SOLN
INTRAMUSCULAR | Status: DC | PRN
  Administered 2016-08-06: 100 ug via INTRAVENOUS

## 2016-08-06 MED ORDER — DEXAMETHASONE SODIUM PHOSPHATE 10 MG/ML IJ SOLN
INTRAMUSCULAR | Status: DC | PRN
  Administered 2016-08-06: 10 mg via INTRAVENOUS

## 2016-08-06 MED ORDER — ONDANSETRON HCL 4 MG/2ML IJ SOLN: 4.0000 mg | Freq: Once | INTRAMUSCULAR | Status: DC | PRN

## 2016-08-06 MED ORDER — FENTANYL CITRATE (PF) 100 MCG/2ML IJ SOLN: 25.0000 ug | INTRAMUSCULAR | Status: DC | PRN

## 2016-08-06 MED ORDER — LACTATED RINGERS IV SOLN
INTRAVENOUS | Status: DC
  Administered 2016-08-06 (×2): via INTRAVENOUS

## 2016-08-06 MED ORDER — LIDOCAINE HCL (CARDIAC) 20 MG/ML IV SOLN
INTRAVENOUS | Status: DC | PRN
  Administered 2016-08-06: 100 mg via INTRAVENOUS

## 2016-08-06 MED ORDER — AMOXICILLIN 400 MG/5ML PO SUSR
ORAL | 0 refills | Status: DC
Start: 2016-08-06 — End: 2017-05-22

## 2016-08-06 MED ORDER — ONDANSETRON HCL 4 MG/2ML IJ SOLN
INTRAMUSCULAR | Status: DC | PRN
  Administered 2016-08-06: 4 mg via INTRAVENOUS

## 2016-08-06 MED ORDER — OXYMETAZOLINE HCL 0.05 % NA SOLN
NASAL | Status: AC
  Filled 2016-08-06: qty 15

## 2016-08-06 MED ORDER — ROCURONIUM BROMIDE 100 MG/10ML IV SOLN
INTRAVENOUS | Status: DC | PRN
  Administered 2016-08-06: 10 mg via INTRAVENOUS

## 2016-08-06 MED ORDER — GLYCOPYRROLATE 0.2 MG/ML IJ SOLN
INTRAMUSCULAR | Status: DC | PRN
  Administered 2016-08-06: 0.2 mg via INTRAVENOUS

## 2016-08-06 MED ORDER — BUPIVACAINE-EPINEPHRINE (PF) 0.25% -1:200000 IJ SOLN
INTRAMUSCULAR | Status: AC
  Filled 2016-08-06: qty 30

## 2016-08-06 MED ORDER — PREDNISOLONE SODIUM PHOSPHATE 15 MG/5ML PO SOLN: ORAL | 0 refills | Status: DC

## 2016-08-06 MED ORDER — PROPOFOL 10 MG/ML IV BOLUS
INTRAVENOUS | Status: DC | PRN
  Administered 2016-08-06: 200 mg via INTRAVENOUS

## 2016-08-06 MED ORDER — HYDRALAZINE HCL 20 MG/ML IJ SOLN
10.0000 mg | Freq: Once | INTRAMUSCULAR | Status: AC
  Administered 2016-08-06: 10 mg via INTRAVENOUS

## 2016-08-06 MED ORDER — LIDOCAINE HCL 2 % EX GEL
CUTANEOUS | Status: DC | PRN
  Administered 2016-08-06: 1 via TOPICAL

## 2016-08-06 MED ORDER — MIDAZOLAM HCL 2 MG/2ML IJ SOLN
INTRAMUSCULAR | Status: DC | PRN
  Administered 2016-08-06: 2 mg via INTRAVENOUS

## 2016-08-06 MED ORDER — HYDRALAZINE HCL 20 MG/ML IJ SOLN
INTRAMUSCULAR | Status: AC
  Administered 2016-08-06: 10 mg via INTRAVENOUS
  Filled 2016-08-06: qty 1

## 2016-08-06 MED ORDER — ACETAMINOPHEN 10 MG/ML IV SOLN
INTRAVENOUS | Status: DC | PRN
  Administered 2016-08-06: 1000 mg via INTRAVENOUS

## 2016-08-06 MED ORDER — HYDROCODONE-ACETAMINOPHEN 7.5-325 MG/15ML PO SOLN: ORAL | 0 refills | Status: DC

## 2016-08-06 MED ORDER — SUCCINYLCHOLINE CHLORIDE 20 MG/ML IJ SOLN
INTRAMUSCULAR | Status: DC | PRN
  Administered 2016-08-06: 140 mg via INTRAVENOUS

## 2016-08-06 SURGICAL SUPPLY — 16 items
CANISTER SUCT 1200ML W/VALVE (MISCELLANEOUS) ×2 IMPLANT
CATH ROBINSON RED A/P 10FR (CATHETERS) ×2 IMPLANT
COAG SUCT 10F 3.5MM HAND CTRL (MISCELLANEOUS) ×2 IMPLANT
ELECT REM PT RETURN 9FT ADLT (ELECTROSURGICAL) ×2
ELECTRODE REM PT RTRN 9FT ADLT (ELECTROSURGICAL) ×1 IMPLANT
GLOVE BIO SURGEON STRL SZ7.5 (GLOVE) ×2 IMPLANT
GOWN STRL REUS W/ TWL LRG LVL3 (GOWN DISPOSABLE) ×2 IMPLANT
GOWN STRL REUS W/TWL LRG LVL3 (GOWN DISPOSABLE) ×4
KIT RM TURNOVER STRD PROC AR (KITS) ×2 IMPLANT
LABEL OR SOLS (LABEL) ×1 IMPLANT
NS IRRIG 500ML POUR BTL (IV SOLUTION) ×2 IMPLANT
PACK HEAD/NECK (MISCELLANEOUS) ×2 IMPLANT
PENCIL ELECTRO HAND CTR (MISCELLANEOUS) ×2 IMPLANT
SOL ANTI-FOG 6CC FOG-OUT (MISCELLANEOUS) ×1 IMPLANT
SOL FOG-OUT ANTI-FOG 6CC (MISCELLANEOUS) ×1
SPONGE TONSIL 1 RF SGL (DISPOSABLE) ×2 IMPLANT

## 2016-08-06 NOTE — Discharge Instructions (Signed)

## 2016-08-06 NOTE — Op Note (Signed)
08/06/2016  8:02 AM    Nathaniel Curry  [id number]   Pre-Op Diagnosis:  Tonsillolithiasis, chronic tonsillitis  Post-op Diagnosis: tonsillolithiasis, chronic tonsillitis  Procedure: Tonsillectomy  Surgeon:  Riley Nearing., MD  Anesthesia:  General endotracheal  EBL:  Less than 25 cc  Complications:  None  Findings: 2+ cryptic tonsils  Procedure: The patient was taken to the Operating Room and placed in the supine position.  After induction of general endotracheal anesthesia, the table was turned 90 degrees and the patient was draped in the usual fashion  with the eyes protected.  A mouth gag was inserted into the oral cavity to open the mouth, and examination of the oropharynx showed the uvula was non-bifid. The palate was palpated, and there was no evidence of submucous cleft. Examination of the nasopharynx showed no obstructing adenoids. The right tonsil was grasped with an Allis clamp and resected from the tonsillar fossa in the usual fashion with the Bovie. The left tonsil was resected in the same fashion. The Bovie was used to obtain hemostasis. Each tonsillar fossa was then carefully injected with 0.25% marcaine with epinephrine, 1:200,000, avoiding intravascular injection. The nose and throat were irrigated and suctioned to remove any  blood clot. The mouth gag was  removed with no evidence of active bleeding.  The patient was then returned to the anesthesiologist for awakening, and was taken to the Recovery Room in stable condition.  Cultures:  None.  Specimens:  Tonsils.  Disposition:   PACU to home  Plan: Soft, bland diet and push fluids. Take pain medications and antibiotics as prescribed. No strenuous activity for 2 weeks. Follow-up in 3 weeks.  Riley Nearing 08/06/2016 8:02 AM

## 2016-08-06 NOTE — Anesthesia Postprocedure Evaluation (Signed)
Anesthesia Post Note  Patient: Nathaniel Curry  Procedure(s) Performed: Procedure(s) (LRB): TONSILLECTOMY (N/A)  Patient location during evaluation: PACU Anesthesia Type: General Level of consciousness: awake Pain management: pain level controlled Vital Signs Assessment: post-procedure vital signs reviewed and stable Respiratory status: nonlabored ventilation Cardiovascular status: stable Anesthetic complications: no    Last Vitals:  Vitals:   08/06/16 0632 08/06/16 0816  BP: (!) 173/102 (!) 149/92  Pulse: 66 80  Resp: (!) 22 16  Temp: 36.3 C 36.8 C    Last Pain:  Vitals:   08/06/16 0632  TempSrc: Oral                 VAN STAVEREN,Symone Cornman

## 2016-08-06 NOTE — Anesthesia Procedure Notes (Signed)
Procedure Name: Intubation Date/Time: 08/06/2016 7:31 AM Performed by: Doreen Salvage Pre-anesthesia Checklist: Patient identified, Patient being monitored, Timeout performed, Emergency Drugs available and Suction available Patient Re-evaluated:Patient Re-evaluated prior to inductionOxygen Delivery Method: Circle system utilized Preoxygenation: Pre-oxygenation with 100% oxygen Intubation Type: IV induction Ventilation: Mask ventilation without difficulty Laryngoscope Size: Mac and 3 Grade View: Grade I Tube type: Oral Rae Tube size: 7.0 mm Number of attempts: 1 Airway Equipment and Method: Stylet Placement Confirmation: ETT inserted through vocal cords under direct vision,  positive ETCO2 and breath sounds checked- equal and bilateral Secured at: 21 cm Tube secured with: Tape Dental Injury: Teeth and Oropharynx as per pre-operative assessment

## 2016-08-06 NOTE — Transfer of Care (Signed)
Immediate Anesthesia Transfer of Care Note  Patient: Nathaniel Curry  Procedure(s) Performed: Procedure(s): TONSILLECTOMY (N/A)  Patient Location: PACU  Anesthesia Type:General  Level of Consciousness: sedated  Airway & Oxygen Therapy: Patient Spontanous Breathing and Patient connected to face mask oxygen  Post-op Assessment: Report given to RN and Post -op Vital signs reviewed and stable  Post vital signs: Reviewed and stable  Last Vitals:  Vitals:   08/06/16 0632 08/06/16 0816  BP: (!) 173/102 (!) 149/92  Pulse: 66 80  Resp: (!) 22 16  Temp: 36.3 C [id number] C    Complications: No apparent anesthesia complications Immediate Anesthesia Transfer of Care Note  Patient: Nathaniel Curry  Procedure(s) Performed: Procedure(s): TONSILLECTOMY (N/A)  Patient Location: PACU  Anesthesia Type:General  Level of Consciousness: sedated  Airway & Oxygen Therapy: Patient Spontanous Breathing and Patient connected to face mask oxygen  Post-op Assessment: Report given to RN and Post -op Vital signs reviewed and stable  Post vital signs: Reviewed and stable  Last Vitals:  Vitals:   08/06/16 0632 08/06/16 0816  BP: (!) 173/102 (!) 149/92  Pulse: 66 80  Resp: (!) 22 16  Temp: 36.3 C [id number] C    Complications: No apparent anesthesia complications

## 2016-08-06 NOTE — H&P (Signed)
History and physical reviewed and will be scanned in later. No change in medical status reported by the patient or family, appears stable for surgery. All questions regarding the procedure answered, and patient (or family if a child) expressed understanding of the procedure.  Nathaniel Curry @TODAY@ 

## 2016-08-06 NOTE — Anesthesia Preprocedure Evaluation (Signed)
Anesthesia Evaluation  Patient identified by MRN, date of birth, ID band Patient awake    Reviewed: Allergy & Precautions, NPO status , Patient's Chart, lab work & pertinent test results  Airway Mallampati: II       Dental  (+) Teeth Intact   Pulmonary neg pulmonary ROS,    breath sounds clear to auscultation       Cardiovascular hypertension,  Rhythm:Regular     Neuro/Psych negative neurological ROS  negative psych ROS   GI/Hepatic Neg liver ROS, GERD  Medicated,  Endo/Other  negative endocrine ROS  Renal/GU negative Renal ROS     Musculoskeletal negative musculoskeletal ROS (+)   Abdominal (+) + obese,   Peds  Hematology negative hematology ROS (+)   Anesthesia Other Findings   Reproductive/Obstetrics                             Anesthesia Physical Anesthesia Plan  ASA: II  Anesthesia Plan: General   Post-op Pain Management:    Induction: Intravenous  Airway Management Planned: Oral ETT  Additional Equipment:   Intra-op Plan:   Post-operative Plan: Extubation in OR  Informed Consent: I have reviewed the patients History and Physical, chart, labs and discussed the procedure including the risks, benefits and alternatives for the proposed anesthesia with the patient or authorized representative who has indicated his/her understanding and acceptance.     Plan Discussed with: CRNA  Anesthesia Plan Comments:         Anesthesia Quick Evaluation

## 2016-08-07 LAB — SURGICAL PATHOLOGY

## 2016-11-19 DIAGNOSIS — L570 Actinic keratosis: Secondary | ICD-10-CM | POA: Diagnosis not present

## 2016-11-19 DIAGNOSIS — D492 Neoplasm of unspecified behavior of bone, soft tissue, and skin: Secondary | ICD-10-CM | POA: Diagnosis not present

## 2016-11-24 DIAGNOSIS — D509 Iron deficiency anemia, unspecified: Secondary | ICD-10-CM | POA: Diagnosis not present

## 2016-11-24 DIAGNOSIS — E039 Hypothyroidism, unspecified: Secondary | ICD-10-CM | POA: Diagnosis not present

## 2016-11-24 DIAGNOSIS — R7301 Impaired fasting glucose: Secondary | ICD-10-CM | POA: Diagnosis not present

## 2016-11-24 DIAGNOSIS — E291 Testicular hypofunction: Secondary | ICD-10-CM | POA: Diagnosis not present

## 2016-11-24 DIAGNOSIS — M064 Inflammatory polyarthropathy: Secondary | ICD-10-CM | POA: Diagnosis not present

## 2017-05-21 DIAGNOSIS — H1089 Other conjunctivitis: Secondary | ICD-10-CM | POA: Diagnosis not present

## 2017-05-21 DIAGNOSIS — H52223 Regular astigmatism, bilateral: Secondary | ICD-10-CM | POA: Diagnosis not present

## 2017-05-21 DIAGNOSIS — I10 Essential (primary) hypertension: Secondary | ICD-10-CM | POA: Diagnosis not present

## 2017-05-22 ENCOUNTER — Encounter: Payer: Self-pay | Admitting: Family Medicine

## 2017-05-22 ENCOUNTER — Ambulatory Visit (INDEPENDENT_AMBULATORY_CARE_PROVIDER_SITE_OTHER): Payer: 59 | Admitting: Family Medicine

## 2017-05-22 VITALS — BP 158/100 | HR 83 | Temp 99.1°F | Wt 340.0 lb

## 2017-05-22 DIAGNOSIS — E559 Vitamin D deficiency, unspecified: Secondary | ICD-10-CM | POA: Diagnosis not present

## 2017-05-22 DIAGNOSIS — R5382 Chronic fatigue, unspecified: Secondary | ICD-10-CM | POA: Diagnosis not present

## 2017-05-22 DIAGNOSIS — K2270 Barrett's esophagus without dysplasia: Secondary | ICD-10-CM | POA: Insufficient documentation

## 2017-05-22 DIAGNOSIS — Z6841 Body Mass Index (BMI) 40.0 and over, adult: Secondary | ICD-10-CM

## 2017-05-22 DIAGNOSIS — D649 Anemia, unspecified: Secondary | ICD-10-CM | POA: Insufficient documentation

## 2017-05-22 DIAGNOSIS — Z8739 Personal history of other diseases of the musculoskeletal system and connective tissue: Secondary | ICD-10-CM | POA: Insufficient documentation

## 2017-05-22 DIAGNOSIS — K219 Gastro-esophageal reflux disease without esophagitis: Secondary | ICD-10-CM | POA: Insufficient documentation

## 2017-05-22 DIAGNOSIS — D508 Other iron deficiency anemias: Secondary | ICD-10-CM

## 2017-05-22 DIAGNOSIS — I10 Essential (primary) hypertension: Secondary | ICD-10-CM | POA: Diagnosis not present

## 2017-05-22 LAB — COMPREHENSIVE METABOLIC PANEL
ALT: 17 U/L (ref 0–53)
AST: 18 U/L (ref 0–37)
Albumin: 4.2 g/dL (ref 3.5–5.2)
Alkaline Phosphatase: 54 U/L (ref 39–117)
BUN: 12 mg/dL (ref 6–23)
CHLORIDE: 105 meq/L (ref 96–112)
CO2: 28 mEq/L (ref 19–32)
CREATININE: 1.02 mg/dL (ref 0.40–1.50)
Calcium: 9.2 mg/dL (ref 8.4–10.5)
GFR: 88.25 mL/min (ref >60.00)
GLUCOSE: 105 mg/dL — AB (ref 70–99)
POTASSIUM: 4.1 meq/L (ref 3.5–5.1)
SODIUM: 140 meq/L (ref 135–145)
TOTAL PROTEIN: 7.1 g/dL (ref 6.0–8.3)
Total Bilirubin: 0.6 mg/dL (ref 0.2–1.2)

## 2017-05-22 LAB — TSH: TSH: 1.36 u[IU]/mL (ref 0.35–4.50)

## 2017-05-22 LAB — CBC
HCT: 38.8 % — ABNORMAL LOW (ref 39.0–52.0)
Hemoglobin: 12 g/dL — ABNORMAL LOW (ref 13.0–17.0)
MCHC: 31 g/dL (ref 30.0–36.0)
MCV: 71.9 fl — AB (ref 78.0–100.0)
PLATELETS: 410 10*3/uL — AB (ref 150.0–400.0)
RBC: 5.4 Mil/uL (ref 4.22–5.81)
RDW: 16.3 % — ABNORMAL HIGH (ref 11.5–15.5)
WBC: 7.2 10*3/uL (ref 4.0–10.5)

## 2017-05-22 LAB — FOLATE: FOLATE: 7.2 ng/mL (ref >5.9)

## 2017-05-22 LAB — HEMOGLOBIN A1C: HEMOGLOBIN A1C: 5.9 % (ref 4.6–6.5)

## 2017-05-22 LAB — VITAMIN B12: Vitamin B-12: 444 pg/mL (ref 211–911)

## 2017-05-22 LAB — LIPID PANEL
Cholesterol: 179 mg/dL (ref 0–200)
HDL: 48.2 mg/dL (ref >39.00)
LDL CALC: 112 mg/dL — AB (ref 0–99)
NONHDL: 130.39
Total CHOL/HDL Ratio: 4
Triglycerides: 90 mg/dL (ref 0.0–149.0)
VLDL: 18 mg/dL (ref 0.0–40.0)

## 2017-05-22 LAB — VITAMIN D 25 HYDROXY (VIT D DEFICIENCY, FRACTURES): VITD: 24.3 ng/mL — ABNORMAL LOW (ref 30.00–100.00)

## 2017-05-22 MED ORDER — INDAPAMIDE 2.5 MG PO TABS: 5.0000 mg | ORAL_TABLET | Freq: Every day | ORAL | 1 refills | Status: DC

## 2017-05-22 MED ORDER — FERROUS SULFATE 325 (65 FE) MG PO TABS: 325.0000 mg | ORAL_TABLET | Freq: Two times a day (BID) | ORAL | Status: DC

## 2017-05-22 MED ORDER — LISINOPRIL 20 MG PO TABS: 20.0000 mg | ORAL_TABLET | Freq: Every day | ORAL | 1 refills | Status: DC

## 2017-05-22 NOTE — Assessment & Plan Note (Signed)
Uncontrolled. Increasing indapamide and adding lisinopril. Of note, he has been taking some leftover lisinopril at home (20 mg daily).

## 2017-05-22 NOTE — Assessment & Plan Note (Signed)
New problem. Uncertain etiology but likely multifactorial: Morbid obesity, weight gain, anemia, sedentary lifestyle. Laboratory studies today for further eval.

## 2017-05-22 NOTE — Patient Instructions (Signed)
I have increased the indapamide. I have added lisinopril.  We will call with the lab results.  Follow up to be scheduled pending labs.  Take care  Dr. Lacinda Axon

## 2017-05-22 NOTE — Progress Notes (Signed)
Subjective:  Patient ID: Nathaniel Curry, male    DOB: June 03, 1981  Age: 36 y.o. MRN: [id number]  CC:  Fatigue, hypertension  HPI Nathaniel Curry is a 36 y.o. male presents to the clinic today as a new patient with the above complaints.  Fatigue  Patient reports worsening fatigue over the past 8 months.  He reports he's had increasing stress.  He's also gained a considerable amount of weight. He is morbidly obese.  He states that he feels that he has little to no energy. He states that it feels like he has the flu.  He reports associated shortness of breath, particularly with exertion. He is not exercising at this time.  He states that he's had prior workup regarding obstructive sleep apnea and it was negative. He states he sleeps well, 6-8 hours a night.  He is concerned that his underlying iron deficiency anemia is intruding. He's requesting labs today.  No known exacerbating factors.  No other associated symptoms.  Hypertension  Blood pressure elevated.  He states that his blood pressure is uncontrolled.  He's currently on indapamide for hypertension and nephrolithiasis.  PMH, Surgical Hx, Family Hx, Social History reviewed and updated as below.  Past Medical History:  Diagnosis Date  . Allergy   . Anemia   . Barrett's esophagus   . GERD (gastroesophageal reflux disease)   . Gout   . Gout   . History of kidney stones   . Hypertension   . Nephrolithiasis 02/08/2014  . Obesity    Past Surgical History:  Procedure Laterality Date  . BARIATRIC SURGERY     Gastric sleeve  . CERVICAL FUSION    . COLONOSCOPY WITH PROPOFOL N/A 01/11/2016   Procedure: COLONOSCOPY WITH PROPOFOL;  Surgeon: Lollie Sails, MD;  Location: [facility] ENDOSCOPY;  Service: Endoscopy;  Laterality: N/A;  . ESOPHAGOGASTRODUODENOSCOPY  01/11/2016   Procedure: ESOPHAGOGASTRODUODENOSCOPY (EGD);  Surgeon: Lollie Sails, MD;  Location: [facility] ENDOSCOPY;  Service: Endoscopy;;  . LASIK    . TONSILLECTOMY  N/A 08/06/2016   Procedure: TONSILLECTOMY;  Surgeon: Clyde Canterbury, MD;  Location: [facility] ORS;  Service: ENT;  Laterality: N/A;  . UPPER GASTROINTESTINAL ENDOSCOPY     Family History  Problem Relation Age of Onset  . Alcohol abuse Mother   . Cancer Mother        colon  . Hypertension Mother   . Hyperlipidemia Father   . Hypertension Father   . Diabetes Father   . Hypertension Maternal Grandmother   . Alcohol abuse Maternal Grandmother   . Hypertension Maternal Grandfather   . Alcohol abuse Maternal Grandfather   . Hypertension Paternal Grandmother   . Hypertension Paternal Grandfather    Social History  Substance Use Topics  . Smoking status: Never Smoker  . Smokeless tobacco: Never Used  . Alcohol use No    Review of Systems  Constitutional: Positive for fatigue.  Respiratory: Positive for shortness of breath.   Psychiatric/Behavioral:       Stress.  All other systems reviewed and are negative.   Objective:   Today's Vitals: BP (!) 158/100   Pulse 83   Temp 99.1 F (37.3 C) (Oral)   Wt (!) 340 lb (154.2 kg)   SpO2 97%   BMI 51.70 kg/m   Physical Exam  Constitutional: He is oriented to person, place, and time. He appears well-developed and well-nourished. No distress.  Morbidly obese male in no acute distress.  HENT:  Head: Normocephalic and atraumatic.  Mouth/Throat:  Oropharynx is clear and moist.  Eyes: Conjunctivae are normal. No scleral icterus.  Neck: Neck supple. No thyromegaly present.  Cardiovascular: Normal rate and regular rhythm.   No murmur heard. Pulmonary/Chest: Effort normal. He has no wheezes. He has no rales.  Abdominal: Soft. He exhibits no distension. There is no tenderness. There is no rebound and no guarding.  Musculoskeletal: Normal range of motion. He exhibits no edema.  Lymphadenopathy:    He has no cervical adenopathy.  Neurological: He is alert and oriented to person, place, and time.  No apparent focal neurological deficits.    Psychiatric: He has a normal mood and affect. His behavior is normal. Thought content normal.  Vitals reviewed.  Assessment & Plan:   Problem List Items Addressed This Visit    Absolute anemia   Relevant Medications   ferrous sulfate 325 (65 FE) MG tablet   Other Relevant Orders   CBC   Iron, TIBC and Ferritin Panel   Vitamin B12   Folate   Chronic fatigue - Primary    New problem. Uncertain etiology but likely multifactorial: Morbid obesity, weight gain, anemia, sedentary lifestyle. Laboratory studies today for further eval.       Relevant Orders   TSH   Essential (primary) hypertension    Uncontrolled. Increasing indapamide and adding lisinopril. Of note, he has been taking some leftover lisinopril at home (20 mg daily).      Relevant Medications   indapamide (LOZOL) 2.5 MG tablet   lisinopril (PRINIVIL,ZESTRIL) 20 MG tablet   Vitamin D deficiency   Relevant Orders   Vitamin D (25 hydroxy)    Other Visit Diagnoses    Morbid obesity with BMI of 50.0-59.9, adult ([place])       Relevant Orders   Hemoglobin A1c   Comprehensive metabolic panel   Lipid panel      Meds ordered this encounter  Medications  . pantoprazole (PROTONIX) 40 MG tablet    Sig: Take 40 mg by mouth 2 (two) times daily.   Marland Kitchen DISCONTD: ferrous sulfate 325 (65 FE) MG tablet    Sig: Take 325 mg by mouth daily with breakfast.  . vitamin C (ASCORBIC ACID) 250 MG tablet    Sig: Take 250 mg by mouth 2 (two) times daily.   Marland Kitchen DISCONTD: Calcium Carbonate-Vitamin D (CALCIUM PLUS VITAMIN D) 300-100 MG-UNIT CAPS    Sig: Take 1,000 Units by mouth daily.   Marland Kitchen DISCONTD: indapamide (LOZOL) 2.5 MG tablet    Sig: Take 2.5 mg by mouth daily.  . sucralfate (CARAFATE) 1 g tablet    Sig: Take 1 g by mouth 4 (four) times daily -  with meals and at bedtime.  . indapamide (LOZOL) 2.5 MG tablet    Sig: Take 2 tablets (5 mg total) by mouth daily.    Dispense:  180 tablet    Refill:  1  . lisinopril (PRINIVIL,ZESTRIL) 20  MG tablet    Sig: Take 1 tablet (20 mg total) by mouth daily.    Dispense:  90 tablet    Refill:  1  . ferrous sulfate 325 (65 FE) MG tablet    Sig: Take 1 tablet (325 mg total) by mouth 2 (two) times daily with a meal.   Follow-up: Pending labs.  [place]

## 2017-05-23 LAB — IRON,TIBC AND FERRITIN PANEL
%SAT: 8 % — ABNORMAL LOW (ref 15–60)
FERRITIN: 3 ng/mL — AB (ref 20–345)
Iron: 42 ug/dL — ABNORMAL LOW (ref 50–180)
TIBC: 513 ug/dL — ABNORMAL HIGH (ref 250–425)

## 2017-05-25 ENCOUNTER — Telehealth: Payer: Self-pay

## 2017-05-25 ENCOUNTER — Other Ambulatory Visit: Payer: Self-pay | Admitting: Family Medicine

## 2017-05-25 DIAGNOSIS — D508 Other iron deficiency anemias: Secondary | ICD-10-CM

## 2017-05-25 MED ORDER — VITAMIN D (ERGOCALCIFEROL) 1.25 MG (50000 UNIT) PO CAPS: 50000.0000 [IU] | ORAL_CAPSULE | ORAL | 0 refills | Status: DC

## 2017-05-25 NOTE — Telephone Encounter (Signed)
Patient returned your call.

## 2017-05-25 NOTE — Telephone Encounter (Signed)
Needs to be  re-evaluated. ?

## 2017-05-25 NOTE — Telephone Encounter (Signed)
Patient states started the lisinopril and increase the indapamide dosage.   He states had dizziness , epsiodes of pouring sweat, exertional shortness of breath, denies chest pain, blood pressure 162/116 Heart rate standing 128 , resting heart rate 110.  Fluid intake has been adequate.

## 2017-05-25 NOTE — Telephone Encounter (Signed)
Left voice mail to call back 

## 2017-05-26 NOTE — Telephone Encounter (Signed)
Please call pt at [phone]

## 2017-05-26 NOTE — Telephone Encounter (Addendum)
Left voice mail advising needs to schedule appointment Sent my chart message.

## 2017-05-26 NOTE — Telephone Encounter (Signed)
Left voice mail to call back 

## 2017-05-28 ENCOUNTER — Inpatient Hospital Stay: Payer: 59

## 2017-05-28 ENCOUNTER — Inpatient Hospital Stay: Payer: 59 | Attending: Oncology | Admitting: Oncology

## 2017-05-28 ENCOUNTER — Other Ambulatory Visit: Payer: Self-pay | Admitting: *Deleted

## 2017-05-28 ENCOUNTER — Encounter: Payer: Self-pay | Admitting: Oncology

## 2017-05-28 VITALS — BP 144/92 | HR 111 | Temp 99.9°F

## 2017-05-28 VITALS — BP 103/70 | HR 97 | Temp 96.3°F

## 2017-05-28 DIAGNOSIS — D509 Iron deficiency anemia, unspecified: Secondary | ICD-10-CM

## 2017-05-28 DIAGNOSIS — E669 Obesity, unspecified: Secondary | ICD-10-CM | POA: Insufficient documentation

## 2017-05-28 DIAGNOSIS — R000 Tachycardia, unspecified: Secondary | ICD-10-CM | POA: Diagnosis not present

## 2017-05-28 DIAGNOSIS — M109 Gout, unspecified: Secondary | ICD-10-CM | POA: Insufficient documentation

## 2017-05-28 DIAGNOSIS — Z87442 Personal history of urinary calculi: Secondary | ICD-10-CM | POA: Diagnosis not present

## 2017-05-28 DIAGNOSIS — Z79899 Other long term (current) drug therapy: Secondary | ICD-10-CM

## 2017-05-28 DIAGNOSIS — D508 Other iron deficiency anemias: Secondary | ICD-10-CM | POA: Diagnosis not present

## 2017-05-28 DIAGNOSIS — K2270 Barrett's esophagus without dysplasia: Secondary | ICD-10-CM | POA: Diagnosis not present

## 2017-05-28 DIAGNOSIS — I10 Essential (primary) hypertension: Secondary | ICD-10-CM | POA: Diagnosis not present

## 2017-05-28 DIAGNOSIS — E559 Vitamin D deficiency, unspecified: Secondary | ICD-10-CM | POA: Diagnosis not present

## 2017-05-28 DIAGNOSIS — K219 Gastro-esophageal reflux disease without esophagitis: Secondary | ICD-10-CM | POA: Diagnosis not present

## 2017-05-28 HISTORY — DX: Iron deficiency anemia, unspecified: D50.9

## 2017-05-28 LAB — CBC WITH DIFFERENTIAL/PLATELET
BASOS ABS: 0.1 10*3/uL (ref 0–0.1)
Basophils Relative: 1 %
EOS PCT: 1 %
Eosinophils Absolute: 0.2 10*3/uL (ref 0–0.7)
HEMATOCRIT: 40.3 % (ref 40.0–52.0)
Hemoglobin: 13.2 g/dL (ref 13.0–18.0)
LYMPHS ABS: 3.4 10*3/uL (ref 1.0–3.6)
LYMPHS PCT: 21 %
MCH: 22.5 pg — AB (ref 26.0–34.0)
MCHC: 32.6 g/dL (ref 32.0–36.0)
MCV: 68.8 fL — AB (ref 80.0–100.0)
Monocytes Absolute: 1.6 10*3/uL — ABNORMAL HIGH (ref 0.2–1.0)
Monocytes Relative: 10 %
NEUTROS ABS: 10.6 10*3/uL — AB (ref 1.4–6.5)
Neutrophils Relative %: 67 %
Platelets: 548 10*3/uL — ABNORMAL HIGH (ref 150–440)
RBC: 5.86 MIL/uL (ref 4.40–5.90)
RDW: 16.1 % — ABNORMAL HIGH (ref 11.5–14.5)
WBC: 15.9 10*3/uL — AB (ref 3.8–10.6)

## 2017-05-28 LAB — RETICULOCYTES
RBC.: 5.84 MIL/uL (ref 4.40–5.90)
RETIC COUNT ABSOLUTE: 87.6 10*3/uL (ref 19.0–183.0)
Retic Ct Pct: 1.5 % (ref 0.4–3.1)

## 2017-05-28 LAB — MAGNESIUM: MAGNESIUM: 2 mg/dL (ref 1.7–2.4)

## 2017-05-28 MED ORDER — FERUMOXYTOL INJECTION 510 MG/17 ML
510.0000 mg | Freq: Once | INTRAVENOUS | Status: AC
  Administered 2017-05-28: 510 mg via INTRAVENOUS
  Filled 2017-05-28: qty 17

## 2017-05-28 MED ORDER — SODIUM CHLORIDE 0.9 % IV SOLN
Freq: Once | INTRAVENOUS | Status: AC
Start: 2017-05-28 — End: 2017-05-28
  Administered 2017-05-28: 14:00:00 via INTRAVENOUS
  Filled 2017-05-28: qty 1000

## 2017-05-28 NOTE — Progress Notes (Signed)
[facility] Initial Visit:  Patient Care Team: Weldon Inches as PCP - General (Physician Assistant)  CHIEF COMPLAINTS/PURPOSE OF CONSULTATION: I have low iron store.  HISTORY OF PRESENTING ILLNESS: Nathaniel Curry 36 y.o. male with PMH listed as below presents with complaints of profound fatigue for the past 8 months. He follows up with primary care provider and has had lab work up done recently. Lab finding is consistent with iron deficiency anemia. Patient has taken oral ferrous supplements for about a year however iron store did not improve. Patient has long standing GERD and is on PPI. Also history of gastric sleeve.  He had EGD and Colonoscopy done in 2017 which revealed Barrett Esophagitis (with stigmata of bleeding/friability was found), and diverticulosis sigmoid and descending colon. Today patient reports feeling fatigue. He has gained weight lately due to not able to keep up his exercise due to lack of energy. He also had hypertension and kidney stone and has started on Lozol.    Review of Systems  Constitutional: Positive for fatigue.  HENT:  Negative.   Eyes: Negative.   Respiratory: Negative.   Cardiovascular: Negative.   Gastrointestinal: Negative.   Endocrine: Negative.   Genitourinary: Negative.    Musculoskeletal: Negative.   Skin: Negative.   Neurological: Negative.   Hematological: Negative.   Psychiatric/Behavioral: Negative.     MEDICAL HISTORY: Past Medical History:  Diagnosis Date  . Allergy   . Anemia   . Barrett's esophagus   . GERD (gastroesophageal reflux disease)   . Gout   . Gout   . History of kidney stones   . Hypertension   . Iron deficiency anemia 05/28/2017  . Nephrolithiasis 02/08/2014  . Obesity     SURGICAL HISTORY: Past Surgical History:  Procedure Laterality Date  . BARIATRIC SURGERY     Gastric sleeve  . CERVICAL FUSION    . COLONOSCOPY WITH PROPOFOL N/A 01/11/2016   Procedure: COLONOSCOPY WITH PROPOFOL;   Surgeon: Lollie Sails, MD;  Location: [facility] ENDOSCOPY;  Service: Endoscopy;  Laterality: N/A;  . ESOPHAGOGASTRODUODENOSCOPY  01/11/2016   Procedure: ESOPHAGOGASTRODUODENOSCOPY (EGD);  Surgeon: Lollie Sails, MD;  Location: [facility] ENDOSCOPY;  Service: Endoscopy;;  . LASIK    . TONSILLECTOMY N/A 08/06/2016   Procedure: TONSILLECTOMY;  Surgeon: Clyde Canterbury, MD;  Location: [facility] ORS;  Service: ENT;  Laterality: N/A;  . UPPER GASTROINTESTINAL ENDOSCOPY      SOCIAL HISTORY: Social History   Social History  . Marital status: Single    Spouse name: N/A  . Number of children: N/A  . Years of education: N/A   Occupational History  .  [employer]   Social History Main Topics  . Smoking status: Never Smoker  . Smokeless tobacco: Never Used  . Alcohol use No  . Drug use: No  . Sexual activity: No   Other Topics Concern  . Not on file   Social History Narrative  . No narrative on file    FAMILY HISTORY Family History  Problem Relation Age of Onset  . Alcohol abuse Mother   . Cancer Mother        colon  . Hypertension Mother   . Hyperlipidemia Father   . Hypertension Father   . Diabetes Father   . Hypertension Maternal Grandmother   . Alcohol abuse Maternal Grandmother   . Hypertension Maternal Grandfather   . Alcohol abuse Maternal Grandfather   . Hypertension Paternal Grandmother   . Hypertension Paternal  Grandfather     ALLERGIES:  has No Known Allergies.  MEDICATIONS:  Current Outpatient Prescriptions  Medication Sig Dispense Refill  . ferrous sulfate 325 (65 FE) MG tablet Take 1 tablet (325 mg total) by mouth 2 (two) times daily with a meal.    . indapamide (LOZOL) 2.5 MG tablet Take 2 tablets (5 mg total) by mouth daily. 180 tablet 1  . lisinopril (PRINIVIL,ZESTRIL) 20 MG tablet Take 1 tablet (20 mg total) by mouth daily. 90 tablet 1  . pantoprazole (PROTONIX) 40 MG tablet Take 40 mg by mouth 2 (two) times daily.     . sucralfate (CARAFATE)  1 g tablet Take 1 g by mouth 4 (four) times daily -  with meals and at bedtime.    . vitamin C (ASCORBIC ACID) 250 MG tablet Take 250 mg by mouth 2 (two) times daily.     . Vitamin D, Ergocalciferol, (DRISDOL) 50000 units CAPS capsule Take 1 capsule (50,000 Units total) by mouth every 7 (seven) days. 12 capsule 0   No current facility-administered medications for this visit.     PHYSICAL EXAMINATION:  ECOG PERFORMANCE STATUS: 1 - Symptomatic but completely ambulatory   Vitals:   05/28/17 0827  BP: (!) 144/92  Pulse: (!) 111  Temp: 99.9 F (37.7 C)    There were no vitals filed for this visit.   Physical Exam GENERAL: No distress, well nourished. Morbid obesity. Pallor SKIN:  No rashes or significant lesions  HEAD: Normocephalic, No masses, lesions, tenderness or abnormalities  EYES: Conjunctiva are pale, non icteric ENT: External ears normal ,lips , buccal mucosa, and tongue normal and mucous membranes are moist  LYMPH: No palpable cervical and axillary lymphadenopathy  LUNGS: Clear to auscultation, no crackles or wheezes HEART: Regular rate & rhythm, no murmurs, no gallops, S1 normal and S2 normal  ABDOMEN: Abdomen soft, non-tender, normal bowel sounds, I did not appreciate any  masses or organomegaly  MUSCULOSKELETAL: No CVA tenderness and no tenderness on percussion of the back or rib cage.  EXTREMITIES: No edema, no skin discoloration or tenderness NEURO: Alert & oriented, no focal motor/sensory deficits.   LABORATORY DATA: I have personally reviewed the data as listed:  Office Visit on 05/22/2017  Component Date Value Ref Range Status  . WBC 05/22/2017 7.2  4.0 - 10.5 K/uL Final  . RBC 05/22/2017 5.40  4.22 - 5.81 Mil/uL Final  . Platelets 05/22/2017 410.0* 150.0 - 400.0 K/uL Final  . Hemoglobin 05/22/2017 12.0* 13.0 - 17.0 g/dL Final  . HCT 05/22/2017 38.8* 39.0 - 52.0 % Final  . MCV 05/22/2017 71.9* 78.0 - 100.0 fl Final  . MCHC 05/22/2017 31.0  30.0 - 36.0  g/dL Final  . RDW 05/22/2017 16.3* 11.5 - 15.5 % Final  . Hgb A1c MFr Bld 05/22/2017 5.9  4.6 - 6.5 % Final   Glycemic Control Guidelines for People with Diabetes:Non Diabetic:  <6%Goal of Therapy: <7%Additional Action Suggested:  >8%   . Sodium 05/22/2017 140  135 - 145 mEq/L Final  . Potassium 05/22/2017 4.1  3.5 - 5.1 mEq/L Final  . Chloride 05/22/2017 105  96 - 112 mEq/L Final  . CO2 05/22/2017 28  19 - 32 mEq/L Final  . Glucose, Bld 05/22/2017 105* 70 - 99 mg/dL Final  . BUN 05/22/2017 12  6 - 23 mg/dL Final  . Creatinine, Ser 05/22/2017 1.02  0.40 - 1.50 mg/dL Final  . Total Bilirubin 05/22/2017 0.6  0.2 - 1.2 mg/dL Final  .  Alkaline Phosphatase 05/22/2017 54  39 - 117 U/L Final  . AST 05/22/2017 18  0 - 37 U/L Final  . ALT 05/22/2017 17  0 - 53 U/L Final  . Total Protein 05/22/2017 7.1  6.0 - 8.3 g/dL Final  . Albumin 05/22/2017 4.2  3.5 - 5.2 g/dL Final  . Calcium 05/22/2017 9.2  8.4 - 10.5 mg/dL Final  . GFR 05/22/2017 88.25  >60.00 mL/min Final  . Cholesterol 05/22/2017 179  0 - 200 mg/dL Final   ATP III Classification       Desirable:  < 200 mg/dL               Borderline High:  200 - 239 mg/dL          High:  > = 240 mg/dL  . Triglycerides 05/22/2017 90.0  0.0 - 149.0 mg/dL Final   Normal:  <150 mg/dLBorderline High:  150 - 199 mg/dL  . HDL 05/22/2017 48.20  >39.00 mg/dL Final  . VLDL 05/22/2017 18.0  0.0 - 40.0 mg/dL Final  . LDL Cholesterol 05/22/2017 112* 0 - 99 mg/dL Final  . Total CHOL/HDL Ratio 05/22/2017 4   Final                  Men          Women1/2 Average Risk     3.4          3.3Average Risk          5.0          4.42X Average Risk          9.6          7.13X Average Risk          15.0          11.0                      . NonHDL 05/22/2017 130.39   Final   NOTE:  Non-HDL goal should be 30 mg/dL higher than patient's LDL goal (i.e. LDL goal of < 70 mg/dL, would have non-HDL goal of < 100 mg/dL)  . Ferritin 05/22/2017 3* 20 - 345 ng/mL Final  . Iron 05/22/2017  42* 50 - 180 ug/dL Final  . TIBC 05/22/2017 513* 250 - 425 ug/dL Final  . %SAT 05/22/2017 8* 15 - 60 % Final  . Vitamin B-12 05/22/2017 444  211 - 911 pg/mL Final  . Folate 05/22/2017 7.2  >5.9 ng/mL Final  . TSH 05/22/2017 1.36  0.35 - 4.50 uIU/mL Final  . VITD 05/22/2017 24.30* 30.00 - 100.00 ng/mL Final    RADIOGRAPHIC STUDIES: I have personally reviewed the radiological images as listed and agree with the findings in the report  ASSESSMENT/PLAN 1. Other iron deficiency anemia   2. Barrett's esophagus without dysplasia   3. Gastroesophageal reflux disease without esophagitis   4. Essential (primary) hypertension   5. Vitamin D deficiency   6. Tachycardia   7. Current use of proton pump inhibitor    # Lab results was reviewed with patient. He has severe iron deficiency anemia. Etiology THE iron deficiency likely due to chronic blood loss from Barrett's esophagitis he has failed oral iron supplementation. Long-term PPI use Also Cause Iron Deficiency. I will check celiac panel, magnesium level today.  # I discussed with patient that I'll plan to treat him with IV iron with Feraheme 510 mg weekly 2 doses. Rare severe allergic reactions to [facility]  were discussed with patient.  # Tachycardia can be due to anemia or underlying cardiac etiology. I encourage patient to follow up closely with primary care provider and also cardiology if needed for his HTN and tachycardia.  #  Continue Vitamin D supplements. # Follow up with GI for management of esophagitis.  # Will repeat CBC CMP iron TIBC ferratin in 3 months and a follow-up with me.  Orders Placed This Encounter  Procedures  . CBC with Differential/Platelet    Standing Status:   Future    Standing Expiration Date:   05/28/2018  . Comprehensive metabolic panel    Standing Status:   Future    Standing Expiration Date:   05/28/2018  . Iron and TIBC    Standing Status:   Future    Standing Expiration Date:   05/28/2018  . Ferritin     Standing Status:   Future    Standing Expiration Date:   05/28/2018    All questions were answered. The patient knows to call the clinic with any problems, questions or concerns.   Earlie Server, MD  05/28/2017 8:07 AM

## 2017-05-28 NOTE — Telephone Encounter (Signed)
I have a 4:00 appointment blocked on hold for patient .  Please let me know . Thanks.

## 2017-05-28 NOTE — Telephone Encounter (Signed)
If he feels clinically stable, I am happy to see him tomorrow. If he feels that he is worsening, recommend urgent eval.

## 2017-05-28 NOTE — Progress Notes (Signed)
Patient here today as a new patient  

## 2017-05-29 ENCOUNTER — Ambulatory Visit (INDEPENDENT_AMBULATORY_CARE_PROVIDER_SITE_OTHER): Payer: 59

## 2017-05-29 ENCOUNTER — Encounter: Payer: Self-pay | Admitting: Family Medicine

## 2017-05-29 ENCOUNTER — Ambulatory Visit (INDEPENDENT_AMBULATORY_CARE_PROVIDER_SITE_OTHER): Payer: 59 | Admitting: Family Medicine

## 2017-05-29 VITALS — BP 149/96 | HR 129 | Temp 99.0°F | Wt 336.5 lb

## 2017-05-29 DIAGNOSIS — R61 Generalized hyperhidrosis: Secondary | ICD-10-CM

## 2017-05-29 DIAGNOSIS — R0602 Shortness of breath: Secondary | ICD-10-CM | POA: Diagnosis not present

## 2017-05-29 DIAGNOSIS — R000 Tachycardia, unspecified: Secondary | ICD-10-CM

## 2017-05-29 MED ORDER — CARVEDILOL 3.125 MG PO TABS: 3.1250 mg | ORAL_TABLET | Freq: Two times a day (BID) | ORAL | 3 refills | Status: DC

## 2017-05-29 NOTE — Patient Instructions (Signed)
Stop the lisinopril.  Start the [place].  We will call or send a message with the xray results.  If you worsen, go to ER.  Send me a message next week.  Take care  Dr. Lacinda Axon

## 2017-05-31 LAB — CELIAC PANEL 10
ENDOMYSIAL ANTIBODY IGA: NEGATIVE
GLIADIN IGG: 3 U (ref 0–19)
Gliadin IgA: 10 units (ref 0–19)
IGA: 131 mg/dL (ref 90–386)

## 2017-06-01 DIAGNOSIS — R61 Generalized hyperhidrosis: Secondary | ICD-10-CM | POA: Insufficient documentation

## 2017-06-01 DIAGNOSIS — R0602 Shortness of breath: Secondary | ICD-10-CM | POA: Insufficient documentation

## 2017-06-01 DIAGNOSIS — R000 Tachycardia, unspecified: Secondary | ICD-10-CM | POA: Insufficient documentation

## 2017-06-01 NOTE — Assessment & Plan Note (Signed)
New problem. Known (but uncommon side effect of Lisinopril). Stopping lisinopril today.

## 2017-06-01 NOTE — Assessment & Plan Note (Signed)
EKG today revealed sinus rhythm.  BP elevated. Adding Coreg.

## 2017-06-01 NOTE — Assessment & Plan Note (Signed)
Chest xray today.

## 2017-06-01 NOTE — Progress Notes (Signed)
Subjective:  Patient ID: Nathaniel Curry, male    DOB: 04-14-1981  Age: 36 y.o. MRN: [id number]  CC: Tachycardia, Diaphoresis, Elevated BP, continued fatigue  HPI:  36 year old male with increasing anemia, hypertension, GERD presents with the above complaints.  Patient was recently seen. Lisinopril was added for hypertension.  Patient reports that since his last visit, he's had intermittent diaphoresis, increase in his heart rate, and elevated BP. He continues to be fatigued. No reports of chest pain. Shortness of breath with exertion but states that this is at baseline. No fevers or chills. No known inciting factor other than recent addition of lisinopril. Symptoms are severe. He reports that he has seen hematology and is going for iron infusions. No other associated symptoms. No other complaints or concerns.  Social Hx   Social History   Social History  . Marital status: Single    Spouse name: N/A  . Number of children: N/A  . Years of education: N/A   Occupational History  .  [employer]   Social History Main Topics  . Smoking status: Current Every Day Smoker    Types: E-cigarettes  . Smokeless tobacco: Never Used  . Alcohol use No  . Drug use: No  . Sexual activity: No   Other Topics Concern  . None   Social History Narrative  . None    Review of Systems  Constitutional: Positive for diaphoresis and fatigue.  Cardiovascular: Negative for chest pain.       Tachycardia.     Objective:  BP (!) 149/96 (BP Location: Left Arm, Patient Position: Standing, Cuff Size: Large)   Pulse (!) 129   Temp 99 F (37.2 C) (Oral)   Wt (!) 336 lb 8 oz (152.6 kg)   SpO2 95%   BMI 51.16 kg/m   BP/Weight 05/29/2017 01/19/7061 12/24/6281  Systolic BP 151 761 607  Diastolic BP 96 70 92  Wt. (Lbs) 336.5 - -  BMI 51.16 - -    Physical Exam  Constitutional: He is oriented to person, place, and time. He appears well-developed. No distress.  HENT:  Head:  Normocephalic and atraumatic.  Eyes: Conjunctivae are normal. No scleral icterus.  Cardiovascular: Regular rhythm.   Tachycardia.  Pulmonary/Chest: Effort normal. No respiratory distress. He has no wheezes. He has no rales.  Neurological: He is alert and oriented to person, place, and time.  Psychiatric: He has a normal mood and affect.  Vitals reviewed.   Lab Results  Component Value Date   WBC 15.9 (H) 05/28/2017   HGB 13.2 05/28/2017   HCT 40.3 05/28/2017   PLT 548 (H) 05/28/2017   GLUCOSE 105 (H) 05/22/2017   CHOL 179 05/22/2017   TRIG 90.0 05/22/2017   HDL 48.20 05/22/2017   LDLCALC 112 (H) 05/22/2017   ALT 17 05/22/2017   AST 18 05/22/2017   NA 140 05/22/2017   K 4.1 05/22/2017   CL 105 05/22/2017   CREATININE 1.02 05/22/2017   BUN 12 05/22/2017   CO2 28 05/22/2017   TSH 1.36 05/22/2017   PSA 0.76 03/29/2015   INR 1.0 12/09/2012   HGBA1C 5.9 05/22/2017    Assessment & Plan:   Problem List Items Addressed This Visit    Tachycardia - Primary    EKG today revealed sinus rhythm.  BP elevated. Adding Coreg.      Relevant Orders   EKG 12-Lead (Completed)   SOB (shortness of breath) on exertion    Chest xray today.  Relevant Orders   DG Chest 2 View (Completed)   Diaphoresis    New problem. Known (but uncommon side effect of Lisinopril). Stopping lisinopril today.         Meds ordered this encounter  Medications  . carvedilol (COREG) 3.125 MG tablet    Sig: Take 1 tablet (3.125 mg total) by mouth 2 (two) times daily with a meal.    Dispense:  60 tablet    Refill:  3     Follow-up: PRN  [place]

## 2017-06-02 MED ORDER — SODIUM CHLORIDE 0.9 % IV SOLN
Freq: Once | INTRAVENOUS | Status: AC
  Administered 2017-06-03: 13:00:00 via INTRAVENOUS
  Filled 2017-06-02: qty 1000

## 2017-06-02 MED ORDER — SODIUM CHLORIDE 0.9% FLUSH
3.0000 mL | Freq: Once | INTRAVENOUS | Status: DC | PRN
Start: 2017-06-02 — End: 2017-06-03
  Filled 2017-06-02: qty 3

## 2017-06-02 MED ORDER — HEPARIN SOD (PORK) LOCK FLUSH 100 UNIT/ML IV SOLN: 250.0000 [IU] | Freq: Once | INTRAVENOUS | Status: DC | PRN

## 2017-06-02 MED ORDER — ALTEPLASE 2 MG IJ SOLR
2.0000 mg | Freq: Once | INTRAMUSCULAR | Status: DC | PRN
Start: 2017-06-02 — End: 2017-06-03

## 2017-06-02 MED ORDER — HEPARIN SOD (PORK) LOCK FLUSH 100 UNIT/ML IV SOLN: 500.0000 [IU] | Freq: Once | INTRAVENOUS | Status: DC | PRN

## 2017-06-02 MED ORDER — SODIUM CHLORIDE 0.9 % IV SOLN
510.0000 mg | Freq: Once | INTRAVENOUS | Status: AC
  Administered 2017-06-03: 510 mg via INTRAVENOUS
  Filled 2017-06-02: qty 17

## 2017-06-02 MED ORDER — SODIUM CHLORIDE 0.9% FLUSH
10.0000 mL | INTRAVENOUS | Status: DC | PRN
Start: 2017-06-02 — End: 2017-06-03
  Filled 2017-06-02: qty 10

## 2017-06-03 ENCOUNTER — Inpatient Hospital Stay: Payer: 59 | Attending: Oncology

## 2017-06-03 VITALS — BP 153/110 | HR 82 | Temp 96.6°F | Resp 20

## 2017-06-03 DIAGNOSIS — D508 Other iron deficiency anemias: Secondary | ICD-10-CM

## 2017-06-03 MED ORDER — FERUMOXYTOL INJECTION 510 MG/17 ML
INTRAVENOUS | Status: AC
  Filled 2017-06-03: qty 17

## 2017-06-03 NOTE — Patient Instructions (Signed)

## 2017-06-04 ENCOUNTER — Ambulatory Visit: Payer: Self-pay | Admitting: Oncology

## 2017-06-04 ENCOUNTER — Encounter: Payer: Self-pay | Admitting: Family Medicine

## 2017-06-08 ENCOUNTER — Other Ambulatory Visit: Payer: Self-pay | Admitting: Family Medicine

## 2017-06-08 MED ORDER — SERTRALINE HCL 50 MG PO TABS: 50.0000 mg | ORAL_TABLET | Freq: Every day | ORAL | 1 refills | Status: DC

## 2017-06-08 MED ORDER — CARVEDILOL 6.25 MG PO TABS: 3.1250 mg | ORAL_TABLET | Freq: Two times a day (BID) | ORAL | 1 refills | Status: DC

## 2017-06-26 ENCOUNTER — Encounter: Payer: Self-pay | Admitting: Family Medicine

## 2017-07-03 NOTE — Addendum Note (Signed)
Addended by: Earlie Server on: 07/03/2017 08:25 PM   Modules accepted: Orders

## 2017-07-06 ENCOUNTER — Inpatient Hospital Stay: Payer: 59 | Attending: Oncology

## 2017-07-06 DIAGNOSIS — E559 Vitamin D deficiency, unspecified: Secondary | ICD-10-CM | POA: Insufficient documentation

## 2017-07-06 DIAGNOSIS — D508 Other iron deficiency anemias: Secondary | ICD-10-CM | POA: Insufficient documentation

## 2017-07-06 DIAGNOSIS — M109 Gout, unspecified: Secondary | ICD-10-CM | POA: Insufficient documentation

## 2017-07-06 DIAGNOSIS — Z79899 Other long term (current) drug therapy: Secondary | ICD-10-CM | POA: Insufficient documentation

## 2017-07-06 DIAGNOSIS — E669 Obesity, unspecified: Secondary | ICD-10-CM | POA: Insufficient documentation

## 2017-07-06 DIAGNOSIS — Z87442 Personal history of urinary calculi: Secondary | ICD-10-CM | POA: Insufficient documentation

## 2017-07-06 DIAGNOSIS — I10 Essential (primary) hypertension: Secondary | ICD-10-CM | POA: Insufficient documentation

## 2017-07-06 DIAGNOSIS — R000 Tachycardia, unspecified: Secondary | ICD-10-CM | POA: Insufficient documentation

## 2017-07-06 DIAGNOSIS — K219 Gastro-esophageal reflux disease without esophagitis: Secondary | ICD-10-CM | POA: Insufficient documentation

## 2017-07-06 DIAGNOSIS — K2270 Barrett's esophagus without dysplasia: Secondary | ICD-10-CM | POA: Insufficient documentation

## 2017-07-08 ENCOUNTER — Inpatient Hospital Stay: Payer: 59

## 2017-07-08 DIAGNOSIS — E669 Obesity, unspecified: Secondary | ICD-10-CM | POA: Diagnosis not present

## 2017-07-08 DIAGNOSIS — Z79899 Other long term (current) drug therapy: Secondary | ICD-10-CM | POA: Diagnosis not present

## 2017-07-08 DIAGNOSIS — R000 Tachycardia, unspecified: Secondary | ICD-10-CM | POA: Diagnosis not present

## 2017-07-08 DIAGNOSIS — I10 Essential (primary) hypertension: Secondary | ICD-10-CM | POA: Diagnosis not present

## 2017-07-08 DIAGNOSIS — K219 Gastro-esophageal reflux disease without esophagitis: Secondary | ICD-10-CM | POA: Diagnosis not present

## 2017-07-08 DIAGNOSIS — D508 Other iron deficiency anemias: Secondary | ICD-10-CM | POA: Diagnosis not present

## 2017-07-08 DIAGNOSIS — K2270 Barrett's esophagus without dysplasia: Secondary | ICD-10-CM | POA: Diagnosis not present

## 2017-07-08 DIAGNOSIS — M109 Gout, unspecified: Secondary | ICD-10-CM | POA: Diagnosis not present

## 2017-07-08 DIAGNOSIS — Z87442 Personal history of urinary calculi: Secondary | ICD-10-CM | POA: Diagnosis not present

## 2017-07-08 DIAGNOSIS — E559 Vitamin D deficiency, unspecified: Secondary | ICD-10-CM | POA: Diagnosis not present

## 2017-07-08 LAB — FERRITIN: Ferritin: 41 ng/mL (ref 24–336)

## 2017-07-08 LAB — CBC WITH DIFFERENTIAL/PLATELET
Basophils Absolute: 0.1 10*3/uL (ref 0–0.1)
Basophils Relative: 1 %
EOS ABS: 0.4 10*3/uL (ref 0–0.7)
EOS PCT: 4 %
HCT: 45.8 % (ref 40.0–52.0)
Hemoglobin: 15 g/dL (ref 13.0–18.0)
LYMPHS ABS: 2.2 10*3/uL (ref 1.0–3.6)
LYMPHS PCT: 23 %
MCH: 25.3 pg — AB (ref 26.0–34.0)
MCHC: 32.7 g/dL (ref 32.0–36.0)
MCV: 77.5 fL — AB (ref 80.0–100.0)
MONO ABS: 0.8 10*3/uL (ref 0.2–1.0)
Monocytes Relative: 8 %
Neutro Abs: 6.1 10*3/uL (ref 1.4–6.5)
Neutrophils Relative %: 64 %
PLATELETS: 381 10*3/uL (ref 150–440)
RBC: 5.9 MIL/uL (ref 4.40–5.90)
RDW: 23.5 % — ABNORMAL HIGH (ref 11.5–14.5)
WBC: 9.5 10*3/uL (ref 3.8–10.6)

## 2017-07-08 LAB — IRON AND TIBC
Iron: 63 ug/dL (ref 45–182)
Saturation Ratios: 16 % — ABNORMAL LOW (ref 17.9–39.5)
TIBC: 401 ug/dL (ref 250–450)
UIBC: 338 ug/dL

## 2017-07-15 DIAGNOSIS — R1013 Epigastric pain: Secondary | ICD-10-CM | POA: Diagnosis not present

## 2017-07-15 DIAGNOSIS — R1310 Dysphagia, unspecified: Secondary | ICD-10-CM | POA: Diagnosis not present

## 2017-07-15 DIAGNOSIS — K2270 Barrett's esophagus without dysplasia: Secondary | ICD-10-CM | POA: Diagnosis not present

## 2017-07-23 ENCOUNTER — Encounter: Payer: Self-pay | Admitting: Family Medicine

## 2017-07-24 MED ORDER — CARVEDILOL 6.25 MG PO TABS: 6.2500 mg | ORAL_TABLET | Freq: Two times a day (BID) | ORAL | 1 refills | Status: DC

## 2017-07-24 MED ORDER — CARVEDILOL 6.25 MG PO TABS: 3.1250 mg | ORAL_TABLET | Freq: Two times a day (BID) | ORAL | 1 refills | Status: DC

## 2017-07-24 MED ORDER — SERTRALINE HCL 50 MG PO TABS: 50.0000 mg | ORAL_TABLET | Freq: Every day | ORAL | 1 refills | Status: DC

## 2017-07-24 MED ORDER — SERTRALINE HCL 100 MG PO TABS: 100.0000 mg | ORAL_TABLET | Freq: Every day | ORAL | 1 refills | Status: DC

## 2017-08-06 ENCOUNTER — Encounter: Payer: Self-pay | Admitting: *Deleted

## 2017-08-07 ENCOUNTER — Ambulatory Visit: Payer: 59 | Admitting: Anesthesiology

## 2017-08-07 ENCOUNTER — Encounter
Admission: RE | Disposition: A | Discharge status: Home / Self Care | Payer: Self-pay | Source: Ambulatory Visit | Attending: Internal Medicine

## 2017-08-07 ENCOUNTER — Ambulatory Visit
Admission: RE | Admit: 2017-08-07 | Discharge: 2017-08-07 | Disposition: A | Discharge status: Home / Self Care | Payer: 59 | Source: Ambulatory Visit | Attending: Internal Medicine | Admitting: Internal Medicine

## 2017-08-07 ENCOUNTER — Encounter: Payer: Self-pay | Admitting: *Deleted

## 2017-08-07 DIAGNOSIS — Z79899 Other long term (current) drug therapy: Secondary | ICD-10-CM | POA: Diagnosis not present

## 2017-08-07 DIAGNOSIS — Z87442 Personal history of urinary calculi: Secondary | ICD-10-CM | POA: Diagnosis not present

## 2017-08-07 DIAGNOSIS — I10 Essential (primary) hypertension: Secondary | ICD-10-CM | POA: Insufficient documentation

## 2017-08-07 DIAGNOSIS — R1314 Dysphagia, pharyngoesophageal phase: Secondary | ICD-10-CM | POA: Diagnosis not present

## 2017-08-07 DIAGNOSIS — E669 Obesity, unspecified: Secondary | ICD-10-CM | POA: Diagnosis not present

## 2017-08-07 DIAGNOSIS — Z6841 Body Mass Index (BMI) 40.0 and over, adult: Secondary | ICD-10-CM | POA: Diagnosis not present

## 2017-08-07 DIAGNOSIS — K2210 Ulcer of esophagus without bleeding: Secondary | ICD-10-CM | POA: Diagnosis not present

## 2017-08-07 DIAGNOSIS — K210 Gastro-esophageal reflux disease with esophagitis: Secondary | ICD-10-CM | POA: Insufficient documentation

## 2017-08-07 DIAGNOSIS — M109 Gout, unspecified: Secondary | ICD-10-CM | POA: Insufficient documentation

## 2017-08-07 DIAGNOSIS — Z9884 Bariatric surgery status: Secondary | ICD-10-CM | POA: Diagnosis not present

## 2017-08-07 DIAGNOSIS — K449 Diaphragmatic hernia without obstruction or gangrene: Secondary | ICD-10-CM | POA: Diagnosis not present

## 2017-08-07 DIAGNOSIS — K2270 Barrett's esophagus without dysplasia: Secondary | ICD-10-CM | POA: Insufficient documentation

## 2017-08-07 DIAGNOSIS — K222 Esophageal obstruction: Secondary | ICD-10-CM | POA: Diagnosis not present

## 2017-08-07 HISTORY — PX: ESOPHAGOGASTRODUODENOSCOPY (EGD) WITH PROPOFOL: SHX5813

## 2017-08-07 SURGERY — ESOPHAGOGASTRODUODENOSCOPY (EGD) WITH PROPOFOL
Anesthesia: General

## 2017-08-07 MED ORDER — PROPOFOL 500 MG/50ML IV EMUL
INTRAVENOUS | Status: DC | PRN
  Administered 2017-08-07: 150 ug/kg/min via INTRAVENOUS

## 2017-08-07 MED ORDER — SODIUM CHLORIDE 0.9 % IV SOLN
INTRAVENOUS | Status: DC
  Administered 2017-08-07: 13:00:00 via INTRAVENOUS

## 2017-08-07 MED ORDER — SODIUM CHLORIDE 0.9 % IV SOLN
INTRAVENOUS | Status: DC | PRN
  Administered 2017-08-07: 13:00:00 via INTRAVENOUS

## 2017-08-07 MED ORDER — PROPOFOL 500 MG/50ML IV EMUL
INTRAVENOUS | Status: AC
  Filled 2017-08-07: qty 50

## 2017-08-07 MED ORDER — LIDOCAINE HCL (PF) 2 % IJ SOLN
INTRAMUSCULAR | Status: DC | PRN
  Administered 2017-08-07: 50 mg via INTRADERMAL

## 2017-08-07 MED ORDER — PROPOFOL 10 MG/ML IV BOLUS
INTRAVENOUS | Status: DC | PRN
  Administered 2017-08-07: 100 mg via INTRAVENOUS

## 2017-08-07 NOTE — Transfer of Care (Signed)
Immediate Anesthesia Transfer of Care Note  Patient: Nathaniel Curry  Procedure(s) Performed: ESOPHAGOGASTRODUODENOSCOPY (EGD) WITH PROPOFOL (N/A )  Patient Location: Endoscopy Unit  Anesthesia Type:General  Level of Consciousness: awake, alert  and oriented  Airway & Oxygen Therapy: Patient Spontanous Breathing  Post-op Assessment: Report given to RN and Post -op Vital signs reviewed and stable  Post vital signs: Reviewed and stable  Last Vitals:  Vitals:   08/07/17 1251  BP: (!) 161/113  Pulse: 80  Resp: 18  Temp: (!) 36.4 C  SpO2: 100%    Last Pain:  Vitals:   08/07/17 1251  TempSrc: Tympanic         Complications: No apparent anesthesia complications

## 2017-08-07 NOTE — H&P (Signed)
Outpatient short stay form Pre-procedure 08/07/2017 12:58 PM Nathaniel Curry, M.D.  Primary Physician: Stephanie Coup   Reason for visit:  Epigastric Pain, GERD  History of present illness: Patient with epigastric pain and GERD. Has some  dysphagia. No abdominal pain currently. Has abdominal distension. Has hx of IDA.    Current Facility-Administered Medications:  .  0.9 %  sodium chloride infusion, , Intravenous, Continuous, [place], Benay Pike, MD  Prescriptions Prior to Admission  Medication Sig Dispense Refill Last Dose  . carvedilol (COREG) 6.25 MG tablet Take 1 tablet (6.25 mg total) by mouth 2 (two) times daily with a meal. 180 tablet 1 08/07/2017 at 0600  . indapamide (LOZOL) 2.5 MG tablet Take 2 tablets (5 mg total) by mouth daily. 180 tablet 1 08/06/2017 at Unknown time  . pantoprazole (PROTONIX) 40 MG tablet Take 40 mg by mouth 2 (two) times daily.    08/07/2017 at 0600  . sertraline (ZOLOFT) 100 MG tablet Take 1 tablet (100 mg total) by mouth daily. 90 tablet 1 08/06/2017 at Unknown time  . Vitamin D, Ergocalciferol, (DRISDOL) 50000 units CAPS capsule Take 1 capsule (50,000 Units total) by mouth every 7 (seven) days. 12 capsule 0 08/06/2017 at Unknown time  . ferrous sulfate 325 (65 FE) MG tablet Take 1 tablet (325 mg total) by mouth 2 (two) times daily with a meal. (Patient not taking: Reported on 08/07/2017)   Not Taking at Unknown time  . sucralfate (CARAFATE) 1 g tablet Take 1 g by mouth 4 (four) times daily -  with meals and at bedtime.   Not Taking at Unknown time  . vitamin C (ASCORBIC ACID) 250 MG tablet Take 250 mg by mouth 2 (two) times daily.    Not Taking at Unknown time     Not on File   Past Medical History:  Diagnosis Date  . Allergy   . Anemia   . Barrett's esophagus   . GERD (gastroesophageal reflux disease)   . Gout   . Gout   . History of kidney stones   . Hypertension   . Iron deficiency anemia 05/28/2017  . Nephrolithiasis 02/08/2014  . Obesity      Review of systems:      Physical Exam  General appearance: alert, cooperative and appears stated age Resp: clear to auscultation bilaterally Cardio: regular rate and rhythm, S1, S2 normal, no murmur, click, rub or gallop GI: soft, non-tender; bowel sounds normal; no masses,  no organomegaly     Planned procedures: EGD. The patient understands the nature of the planned procedure, indications, risks, alternatives and potential complications including but not limited to bleeding, infection, perforation, damage to internal organs and possible oversedation/side effects from anesthesia. The patient agrees and gives consent to proceed.  Please refer to procedure notes for findings, recommendations and patient disposition/instructions.    Nathaniel Curry, M.D. Gastroenterology 08/07/2017  12:58 PM

## 2017-08-07 NOTE — Op Note (Signed)
[facility] Gastroenterology Patient Name: Nathaniel Curry Procedure Date: 08/07/2017 1:11 PM MRN: [id number] Account #: [id number] Date of Birth: [birth year] Admit Type: Outpatient Age: 36 Room: [facility] ENDO ROOM 4 Gender: Male Note Status: Finalized Procedure:            Upper GI endoscopy Indications:          Esophageal dysphagia, Esophageal reflux symptoms that                        persist despite appropriate therapy, Barrett's esophagus Providers:            Benay Pike. Alice Reichert MD, MD Referring MD:         Angela Adam. Caryl Bis (Referring MD) Medicines:            Propofol per Anesthesia Complications:        No immediate complications. Procedure:            Pre-Anesthesia Assessment:                       - The risks and benefits of the procedure and the                        sedation options and risks were discussed with the                        patient. All questions were answered and informed                        consent was obtained.                       - Patient identification and proposed procedure were                        verified prior to the procedure by the nurse. The                        procedure was verified in the endoscopy suite.                       - ASA Grade Assessment: III - A patient with severe                        systemic disease.                       - After reviewing the risks and benefits, the patient                        was deemed in satisfactory condition to undergo the                        procedure.                       After obtaining informed consent, the endoscope was                        passed under direct vision. Throughout the procedure,  the patient's blood pressure, pulse, and oxygen                        saturations were monitored continuously. The Endoscope                        was introduced through the mouth, and advanced to the                        third part of duodenum.  The upper GI endoscopy was                        accomplished without difficulty. The patient tolerated                        the procedure well. Findings:      The esophagus and gastroesophageal junction were examined with white       light. There were esophageal mucosal changes secondary to established       short-segment Barrett's disease. These changes involved the mucosa along       an irregular Z-line (38 cm from the incisors). Two tongues of       salmon-colored mucosa were present at 38 cm. The maximum longitudinal       extent of these esophageal mucosal changes was 0.6 cm in length. Mucosa       was biopsied with a cold forceps for histology in 4 quadrants at       intervals of 0.5 cm at the gastroesophageal junction. One specimen       bottle was sent to pathology.      Minimal inflammation characterized by erythema was found in the stomach.       No biopsies or other specimens were collected for this exam.      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 This was       characterized by an intact appearance.      The examined duodenum was normal.      Non-occlusive esophageal "B" ring Impression:           - Esophageal mucosal changes secondary to established                        short-segment Barrett's disease. Biopsied.                       - Gastritis. No specimens collected.                       - A sleeve gastrectomy was found, characterized by an                        intact appearance.                       - Normal examined duodenum. Recommendation:       - Patient has a contact number available for                        emergencies. The signs and symptoms of potential                        delayed complications were  discussed with the patient.                        Return to normal activities tomorrow. Written discharge                        instructions were provided to the patient.                       - Resume previous diet.                        - Await pathology results.                       - No repeat upper endoscopy.                       - Return to GI office as previously scheduled.                       - The findings and recommendations were discussed with                        the patient. Procedure Code(s):    --- Professional ---                       [phone], Esophagogastroduodenoscopy, flexible, transoral;                        with biopsy, single or multiple Diagnosis Code(s):    --- Professional ---                       K22.70, Barrett's esophagus without dysplasia                       K29.70, Gastritis, unspecified, without bleeding                       Z98.84, Bariatric surgery status                       R13.14, Dysphagia, pharyngoesophageal phase                       K21.9, Gastro-esophageal reflux disease without                        esophagitis CPT copyright 2016 American Medical Association. All rights reserved. The codes documented in this report are preliminary and upon coder review may  be revised to meet current compliance requirements. Efrain Sella MD, MD 08/07/2017 1:31:58 PM This report has been signed electronically. Number of Addenda: 0 Note Initiated On: 08/07/2017 1:11 PM      [facility]

## 2017-08-07 NOTE — Anesthesia Postprocedure Evaluation (Signed)
Anesthesia Post Note  Patient: Nathaniel Curry  Procedure(s) Performed: ESOPHAGOGASTRODUODENOSCOPY (EGD) WITH PROPOFOL (N/A )  Patient location during evaluation: Endoscopy Anesthesia Type: General Level of consciousness: awake and alert and oriented Pain management: pain level controlled Vital Signs Assessment: post-procedure vital signs reviewed and stable Respiratory status: spontaneous breathing, nonlabored ventilation and respiratory function stable Cardiovascular status: blood pressure returned to baseline and stable Postop Assessment: no signs of nausea or vomiting Anesthetic complications: no     Last Vitals:  Vitals:   08/07/17 1328 08/07/17 1338  BP: 135/89 (!) 154/99  Pulse: 77 70  Resp: 14 19  Temp: 36.4 C   SpO2: 92% 97%    Last Pain:  Vitals:   08/07/17 1328  TempSrc: Tympanic                 Brown Dunlap

## 2017-08-07 NOTE — Anesthesia Post-op Follow-up Note (Signed)
Anesthesia QCDR form completed.        

## 2017-08-07 NOTE — Addendum Note (Signed)
Addendum  created 08/07/17 1358 by Emmie Niemann, MD   Anesthesia Intra Flowsheets edited

## 2017-08-07 NOTE — Anesthesia Preprocedure Evaluation (Signed)
Anesthesia Evaluation  Patient identified by MRN, date of birth, ID band Patient awake    Reviewed: Allergy & Precautions, NPO status , Patient's Chart, lab work & pertinent test results  History of Anesthesia Complications Negative for: history of anesthetic complications  Airway Mallampati: I  TM Distance: >3 FB Neck ROM: Full    Dental no notable dental hx.    Pulmonary neg pulmonary ROS, neg sleep apnea, neg COPD,    breath sounds clear to auscultation- rhonchi (-) wheezing      Cardiovascular Exercise Tolerance: Good hypertension, Pt. on medications (-) CAD, (-) Past MI and (-) Cardiac Stents  Rhythm:Regular Rate:Normal - Systolic murmurs and - Diastolic murmurs    Neuro/Psych negative neurological ROS  negative psych ROS   GI/Hepatic Neg liver ROS, GERD  ,  Endo/Other  negative endocrine ROSneg diabetes  Renal/GU Renal disease: hx of nephrolithiasis.     Musculoskeletal negative musculoskeletal ROS (+)   Abdominal (+) + obese,   Peds  Hematology  (+) anemia ,   Anesthesia Other Findings Past Medical History: No date: Allergy No date: Anemia No date: Barrett's esophagus No date: GERD (gastroesophageal reflux disease) No date: Gout No date: Gout No date: History of kidney stones No date: Hypertension 05/28/2017: Iron deficiency anemia 02/08/2014: Nephrolithiasis No date: Obesity   Reproductive/Obstetrics                             Anesthesia Physical Anesthesia Plan  ASA: II  Anesthesia Plan: General   Post-op Pain Management:    Induction: Intravenous  PONV Risk Score and Plan: 1 and Propofol infusion  Airway Management Planned: Natural Airway  Additional Equipment:   Intra-op Plan:   Post-operative Plan:   Informed Consent: I have reviewed the patients History and Physical, chart, labs and discussed the procedure including the risks, benefits and alternatives  for the proposed anesthesia with the patient or authorized representative who has indicated his/her understanding and acceptance.   Dental advisory given  Plan Discussed with: CRNA and Anesthesiologist  Anesthesia Plan Comments:         Anesthesia Quick Evaluation

## 2017-08-11 ENCOUNTER — Encounter: Payer: Self-pay | Admitting: Internal Medicine

## 2017-08-11 LAB — SURGICAL PATHOLOGY

## 2017-09-19 DIAGNOSIS — Z79899 Other long term (current) drug therapy: Secondary | ICD-10-CM | POA: Insufficient documentation

## 2017-09-19 DIAGNOSIS — K2270 Barrett's esophagus without dysplasia: Secondary | ICD-10-CM | POA: Insufficient documentation

## 2017-09-19 DIAGNOSIS — E876 Hypokalemia: Secondary | ICD-10-CM | POA: Insufficient documentation

## 2017-09-19 DIAGNOSIS — D500 Iron deficiency anemia secondary to blood loss (chronic): Secondary | ICD-10-CM | POA: Insufficient documentation

## 2017-09-19 DIAGNOSIS — M109 Gout, unspecified: Secondary | ICD-10-CM | POA: Insufficient documentation

## 2017-09-19 DIAGNOSIS — I10 Essential (primary) hypertension: Secondary | ICD-10-CM | POA: Insufficient documentation

## 2017-09-19 DIAGNOSIS — K219 Gastro-esophageal reflux disease without esophagitis: Secondary | ICD-10-CM | POA: Insufficient documentation

## 2017-09-30 ENCOUNTER — Inpatient Hospital Stay: Payer: 59 | Attending: Oncology

## 2017-09-30 DIAGNOSIS — M109 Gout, unspecified: Secondary | ICD-10-CM | POA: Diagnosis not present

## 2017-09-30 DIAGNOSIS — E876 Hypokalemia: Secondary | ICD-10-CM | POA: Diagnosis not present

## 2017-09-30 DIAGNOSIS — D508 Other iron deficiency anemias: Secondary | ICD-10-CM

## 2017-09-30 DIAGNOSIS — D500 Iron deficiency anemia secondary to blood loss (chronic): Secondary | ICD-10-CM | POA: Diagnosis not present

## 2017-09-30 DIAGNOSIS — K2270 Barrett's esophagus without dysplasia: Secondary | ICD-10-CM | POA: Diagnosis not present

## 2017-09-30 DIAGNOSIS — Z79899 Other long term (current) drug therapy: Secondary | ICD-10-CM | POA: Diagnosis not present

## 2017-09-30 DIAGNOSIS — K219 Gastro-esophageal reflux disease without esophagitis: Secondary | ICD-10-CM | POA: Diagnosis not present

## 2017-09-30 DIAGNOSIS — I10 Essential (primary) hypertension: Secondary | ICD-10-CM | POA: Diagnosis not present

## 2017-09-30 LAB — COMPREHENSIVE METABOLIC PANEL
ALBUMIN: 4.2 g/dL (ref 3.5–5.0)
ALK PHOS: 54 U/L (ref 38–126)
ALT: 22 U/L (ref 17–63)
AST: 25 U/L (ref 15–41)
Anion gap: 8 (ref 5–15)
BUN: 12 mg/dL (ref 6–20)
CALCIUM: 9.1 mg/dL (ref 8.9–10.3)
CHLORIDE: 102 mmol/L (ref 101–111)
CO2: 29 mmol/L (ref 22–32)
CREATININE: 1.09 mg/dL (ref 0.61–1.24)
GFR calc Af Amer: >60 mL/min (ref >60)
GFR calc non Af Amer: >60 mL/min (ref >60)
GLUCOSE: 99 mg/dL (ref 65–99)
Potassium: 3.2 mmol/L — ABNORMAL LOW (ref 3.5–5.1)
SODIUM: 139 mmol/L (ref 135–145)
Total Bilirubin: 0.5 mg/dL (ref 0.3–1.2)
Total Protein: 8.5 g/dL — ABNORMAL HIGH (ref 6.5–8.1)

## 2017-09-30 LAB — CBC WITH DIFFERENTIAL/PLATELET
Basophils Absolute: 0 10*3/uL (ref 0–0.1)
Basophils Relative: 0 %
EOS ABS: 0.6 10*3/uL (ref 0–0.7)
EOS PCT: 4 %
HCT: 46 % (ref 40.0–52.0)
HEMOGLOBIN: 15.3 g/dL (ref 13.0–18.0)
LYMPHS ABS: 3.8 10*3/uL — AB (ref 1.0–3.6)
Lymphocytes Relative: 29 %
MCH: 27.1 pg (ref 26.0–34.0)
MCHC: 33.2 g/dL (ref 32.0–36.0)
MCV: 81.6 fL (ref 80.0–100.0)
MONO ABS: 1.2 10*3/uL — AB (ref 0.2–1.0)
MONOS PCT: 9 %
NEUTROS PCT: 58 %
Neutro Abs: 7.8 10*3/uL — ABNORMAL HIGH (ref 1.4–6.5)
Platelets: 362 10*3/uL (ref 150–440)
RBC: 5.64 MIL/uL (ref 4.40–5.90)
RDW: 14.2 % (ref 11.5–14.5)
WBC: 13.4 10*3/uL — ABNORMAL HIGH (ref 3.8–10.6)

## 2017-09-30 LAB — IRON AND TIBC
Iron: 62 ug/dL (ref 45–182)
Saturation Ratios: 13 % — ABNORMAL LOW (ref 17.9–39.5)
TIBC: 481 ug/dL — ABNORMAL HIGH (ref 250–450)
UIBC: 419 ug/dL

## 2017-09-30 LAB — FERRITIN: Ferritin: 13 ng/mL — ABNORMAL LOW (ref 24–336)

## 2017-10-01 NOTE — Progress Notes (Addendum)
[facility] Follow up Visit   Patient Care Team: Leone Haven, MD as PCP - General (Family Medicine)  REASON FOR VISIT Follow up for treatment of iron deficiency anemia.   HISTORY OF PRESENTING ILLNESS: Nathaniel Curry 36 y.o. male with PMH listed as below presents with complaints of profound fatigue for the past 8 months. He follows up with primary care provider and has had lab work up done recently. Lab finding is consistent with iron deficiency anemia. Patient has taken oral ferrous supplements for about a year however iron store did not improve. Patient has long standing GERD and is on PPI. Also history of gastric sleeve.   INTERVAL HISTORY Nathaniel Curry is a 36 y.o. male who has above history reviewed by me today presents for follow up visit for management of iron deficiency anemia.  Patient reports feeling better initially after he got IV iron infusion, however, over time, feeling of fatigue comes back. Denies chest pain, SOB, abdominal pain.  Patient is [employer] and today his working schedule was tight.   He had upper endoscopy in Oct 2018 which showed esophageal mucosal changes secondary to established short-segment Barrett's disease. Biopsied. - Gastritis and intact sleeve gastrectomy. Normal exam of duodenum.   EGD and Colonoscopy done in 2017 which revealed Barrett Esophagitis (with stigmata of bleeding/friability was found), and diverticulosis sigmoid and descending colon.   Review of Systems  Constitutional: Positive for fatigue.  HENT:   Negative for hearing loss.   Eyes: Negative for eye problems.  Cardiovascular: Negative for chest pain.  Gastrointestinal: Negative for abdominal distention.  Endocrine: Negative for hot flashes.  Genitourinary: Negative for difficulty urinating and dysuria.   Musculoskeletal: Negative for arthralgias.  Skin: Negative for itching.  Neurological: Negative for dizziness.  Hematological: Negative for  adenopathy.  Psychiatric/Behavioral: The patient is not nervous/anxious.     MEDICAL HISTORY: Past Medical History:  Diagnosis Date  . Allergy   . Anemia   . Barrett's esophagus   . GERD (gastroesophageal reflux disease)   . Gout   . Gout   . History of kidney stones   . Hypertension   . Iron deficiency anemia 05/28/2017  . Nephrolithiasis 02/08/2014  . Obesity     SURGICAL HISTORY: Past Surgical History:  Procedure Laterality Date  . BARIATRIC SURGERY     Gastric sleeve  . CERVICAL FUSION    . COLONOSCOPY WITH PROPOFOL N/A 01/11/2016   Procedure: COLONOSCOPY WITH PROPOFOL;  Surgeon: Lollie Sails, MD;  Location: [facility] ENDOSCOPY;  Service: Endoscopy;  Laterality: N/A;  . ESOPHAGOGASTRODUODENOSCOPY  01/11/2016   Procedure: ESOPHAGOGASTRODUODENOSCOPY (EGD);  Surgeon: Lollie Sails, MD;  Location: [facility] ENDOSCOPY;  Service: Endoscopy;;  . ESOPHAGOGASTRODUODENOSCOPY (EGD) WITH PROPOFOL N/A 08/07/2017   Procedure: ESOPHAGOGASTRODUODENOSCOPY (EGD) WITH PROPOFOL;  Surgeon: Toledo, Benay Pike, MD;  Location: [facility] ENDOSCOPY;  Service: Gastroenterology;  Laterality: N/A;  . LASIK    . TONSILLECTOMY N/A 08/06/2016   Procedure: TONSILLECTOMY;  Surgeon: Clyde Canterbury, MD;  Location: [facility] ORS;  Service: ENT;  Laterality: N/A;  . UPPER GASTROINTESTINAL ENDOSCOPY      SOCIAL HISTORY: Social History   Socioeconomic History  . Marital status: Single    Spouse name: Not on file  . Number of children: Not on file  . Years of education: Not on file  . Highest education level: Not on file  Social Needs  . Financial resource strain: Not on file  . Food insecurity - worry: Not on  file  . Food insecurity - inability: Not on file  . Transportation needs - medical: Not on file  . Transportation needs - non-medical: Not on file  Occupational History    Employer: [employer]  Tobacco Use  . Smoking status: Never Smoker  . Smokeless tobacco: Never Used  Substance and  Sexual Activity  . Alcohol use: No    Alcohol/week: 0.0 oz  . Drug use: No  . Sexual activity: No  Other Topics Concern  . Not on file  Social History Narrative  . Not on file    FAMILY HISTORY Family History  Problem Relation Age of Onset  . Alcohol abuse Mother   . Cancer Mother        colon  . Hypertension Mother   . Hyperlipidemia Father   . Hypertension Father   . Diabetes Father   . Hypertension Maternal Grandmother   . Alcohol abuse Maternal Grandmother   . Hypertension Maternal Grandfather   . Alcohol abuse Maternal Grandfather   . Hypertension Paternal Grandmother   . Hypertension Paternal Grandfather     ALLERGIES:  has no allergies on file.  MEDICATIONS:  Current Outpatient Medications  Medication Sig Dispense Refill  . carvedilol (COREG) 6.25 MG tablet Take 1 tablet (6.25 mg total) by mouth 2 (two) times daily with a meal. 180 tablet 1  . ferrous sulfate 325 (65 FE) MG tablet Take 1 tablet (325 mg total) by mouth 2 (two) times daily with a meal. (Patient not taking: Reported on 08/07/2017)    . indapamide (LOZOL) 2.5 MG tablet Take 2 tablets (5 mg total) by mouth daily. 180 tablet 1  . pantoprazole (PROTONIX) 40 MG tablet Take 40 mg by mouth 2 (two) times daily.     . sertraline (ZOLOFT) 100 MG tablet Take 1 tablet (100 mg total) by mouth daily. 90 tablet 1  . sucralfate (CARAFATE) 1 g tablet Take 1 g by mouth 4 (four) times daily -  with meals and at bedtime.    . vitamin C (ASCORBIC ACID) 250 MG tablet Take 250 mg by mouth 2 (two) times daily.     . Vitamin D, Ergocalciferol, (DRISDOL) 50000 units CAPS capsule Take 1 capsule (50,000 Units total) by mouth every 7 (seven) days. 12 capsule 0   No current facility-administered medications for this visit.     PHYSICAL EXAMINATION:  ECOG PERFORMANCE STATUS: 1 - Symptomatic but completely ambulatory   Vitals:   10/02/17 0842  BP: (!) 140/94  Pulse: 60  Temp: (!) 96 F (35.6 C)    There were no vitals  filed for this visit.   Physical Exam  Constitutional: He is oriented to person, place, and time and well-developed, well-nourished, and in no distress. No distress.  HENT:  Head: Normocephalic and atraumatic.  Mouth/Throat: No oropharyngeal exudate.  Eyes: Conjunctivae and EOM are normal. Pupils are equal, round, and reactive to light.  Neck: Normal range of motion. Neck supple.  Cardiovascular: Normal rate and regular rhythm.  No murmur heard. Pulmonary/Chest: Effort normal and breath sounds normal. He has no wheezes.  Abdominal: Soft. Bowel sounds are normal. He exhibits no mass.  Musculoskeletal: Normal range of motion. He exhibits no edema.  Lymphadenopathy:    He has no cervical adenopathy.  Neurological: He is alert and oriented to person, place, and time.  Skin: Skin is warm and dry.  Psychiatric: Affect and judgment normal.      LABORATORY DATA: I have personally reviewed the  data as listed:  Appointment on 09/30/2017  Component Date Value Ref Range Status  . Ferritin 09/30/2017 13* 24 - 336 ng/mL Final  . Iron 09/30/2017 62  45 - 182 ug/dL Final  . TIBC 09/30/2017 481* 250 - 450 ug/dL Final  . Saturation Ratios 09/30/2017 13* 17.9 - 39.5 % Final  . UIBC 09/30/2017 419  ug/dL Final  . Sodium 09/30/2017 139  135 - 145 mmol/L Final  . Potassium 09/30/2017 3.2* 3.5 - 5.1 mmol/L Final  . Chloride 09/30/2017 102  101 - 111 mmol/L Final  . CO2 09/30/2017 29  22 - 32 mmol/L Final  . Glucose, Bld 09/30/2017 99  65 - 99 mg/dL Final  . BUN 09/30/2017 12  6 - 20 mg/dL Final  . Creatinine, Ser 09/30/2017 1.09  0.61 - 1.24 mg/dL Final  . Calcium 09/30/2017 9.1  8.9 - 10.3 mg/dL Final  . Total Protein 09/30/2017 8.5* 6.5 - 8.1 g/dL Final  . Albumin 09/30/2017 4.2  3.5 - 5.0 g/dL Final  . AST 09/30/2017 25  15 - 41 U/L Final  . ALT 09/30/2017 22  17 - 63 U/L Final  . Alkaline Phosphatase 09/30/2017 54  38 - 126 U/L Final  . Total Bilirubin 09/30/2017 0.5  0.3 - 1.2 mg/dL Final   . GFR calc non Af Amer 09/30/2017 >60  >60 mL/min Final  . GFR calc Af Amer 09/30/2017 >60  >60 mL/min Final   Comment: (NOTE) The eGFR has been calculated using the CKD EPI equation. This calculation has not been validated in all clinical situations. eGFR's persistently <60 mL/min signify possible Chronic Kidney Disease.   . Anion gap 09/30/2017 8  5 - 15 Final  . WBC 09/30/2017 13.4* 3.8 - 10.6 K/uL Final  . RBC 09/30/2017 5.64  4.40 - 5.90 MIL/uL Final  . Hemoglobin 09/30/2017 15.3  13.0 - 18.0 g/dL Final  . HCT 09/30/2017 46.0  40.0 - 52.0 % Final  . MCV 09/30/2017 81.6  80.0 - 100.0 fL Final  . MCH 09/30/2017 27.1  26.0 - 34.0 pg Final  . MCHC 09/30/2017 33.2  32.0 - 36.0 g/dL Final  . RDW 09/30/2017 14.2  11.5 - 14.5 % Final  . Platelets 09/30/2017 362  150 - 440 K/uL Final  . Neutrophils Relative % 09/30/2017 58  % Final  . Neutro Abs 09/30/2017 7.8* 1.4 - 6.5 K/uL Final  . Lymphocytes Relative 09/30/2017 29  % Final  . Lymphs Abs 09/30/2017 3.8* 1.0 - 3.6 K/uL Final  . Monocytes Relative 09/30/2017 9  % Final  . Monocytes Absolute 09/30/2017 1.2* 0.2 - 1.0 K/uL Final  . Eosinophils Relative 09/30/2017 4  % Final  . Eosinophils Absolute 09/30/2017 0.6  0 - 0.7 K/uL Final  . Basophils Relative 09/30/2017 0  % Final  . Basophils Absolute 09/30/2017 0.0  0 - 0.1 K/uL Final    ASSESSMENT/PLAN 1. Barrett's esophagus without dysplasia   2. Gastroesophageal reflux disease without esophagitis   3. Iron deficiency anemia due to chronic blood loss    # Lab results was reviewed with patient. His iron panel initially improved after 2 dose of Feraheme, however ferritin trended down again and he becomes more symptomatic.  Discussed with patient about additional IV iron infusion with Feraheme 559m weekly x 2. Patient agrees with plan.  # Check UA to rule out blood loss from GU tract.  # Source of blood loss, recent EGD and colonoscopy last year normal, no active bleeding. Recommend  patient to follow up with GI for small bowel capsule study.  Hypokalemia: start potassium supplements. Kcl 59mq daily, rx sent to pharmacy.  # Will repeat CBC CMP iron TIBC ferratin in 5 weeks done prior to MD visit . All questions were answered. The patient knows to call the clinic with any problems, questions or concerns.   ZEarlie Server MD, PhD Hematology Oncology CTristar Ash[facility] City [facility] Centerat AStaten Island University Hospital - SouthPager- [phone]/14/2018

## 2017-10-02 ENCOUNTER — Other Ambulatory Visit: Payer: Self-pay

## 2017-10-02 ENCOUNTER — Inpatient Hospital Stay (HOSPITAL_BASED_OUTPATIENT_CLINIC_OR_DEPARTMENT_OTHER): Payer: 59 | Admitting: Oncology

## 2017-10-02 ENCOUNTER — Encounter: Payer: Self-pay | Admitting: Oncology

## 2017-10-02 VITALS — BP 140/94 | HR 60 | Temp 96.0°F

## 2017-10-02 DIAGNOSIS — K219 Gastro-esophageal reflux disease without esophagitis: Secondary | ICD-10-CM

## 2017-10-02 DIAGNOSIS — M109 Gout, unspecified: Secondary | ICD-10-CM | POA: Diagnosis not present

## 2017-10-02 DIAGNOSIS — Z79899 Other long term (current) drug therapy: Secondary | ICD-10-CM

## 2017-10-02 DIAGNOSIS — D500 Iron deficiency anemia secondary to blood loss (chronic): Secondary | ICD-10-CM

## 2017-10-02 DIAGNOSIS — K2270 Barrett's esophagus without dysplasia: Secondary | ICD-10-CM

## 2017-10-02 DIAGNOSIS — E876 Hypokalemia: Secondary | ICD-10-CM | POA: Diagnosis not present

## 2017-10-02 DIAGNOSIS — I10 Essential (primary) hypertension: Secondary | ICD-10-CM | POA: Diagnosis not present

## 2017-10-02 NOTE — Progress Notes (Signed)
Patient here today for follow up.   

## 2017-10-05 MED ORDER — POTASSIUM CHLORIDE ER 10 MEQ PO TBCR: 10.0000 meq | EXTENDED_RELEASE_TABLET | Freq: Every day | ORAL | 0 refills | Status: DC

## 2017-10-05 NOTE — Addendum Note (Signed)
Addended by: Earlie Server on: 10/05/2017 10:13 AM   Modules accepted: Orders

## 2017-10-07 ENCOUNTER — Inpatient Hospital Stay: Payer: 59

## 2017-10-07 VITALS — BP 128/82 | HR 75 | Temp 97.4°F | Resp 18

## 2017-10-07 DIAGNOSIS — D508 Other iron deficiency anemias: Secondary | ICD-10-CM

## 2017-10-07 DIAGNOSIS — K219 Gastro-esophageal reflux disease without esophagitis: Secondary | ICD-10-CM | POA: Diagnosis not present

## 2017-10-07 DIAGNOSIS — D500 Iron deficiency anemia secondary to blood loss (chronic): Secondary | ICD-10-CM

## 2017-10-07 DIAGNOSIS — Z79899 Other long term (current) drug therapy: Secondary | ICD-10-CM | POA: Diagnosis not present

## 2017-10-07 DIAGNOSIS — I10 Essential (primary) hypertension: Secondary | ICD-10-CM | POA: Diagnosis not present

## 2017-10-07 DIAGNOSIS — E876 Hypokalemia: Secondary | ICD-10-CM | POA: Diagnosis not present

## 2017-10-07 DIAGNOSIS — K2270 Barrett's esophagus without dysplasia: Secondary | ICD-10-CM | POA: Diagnosis not present

## 2017-10-07 DIAGNOSIS — M109 Gout, unspecified: Secondary | ICD-10-CM | POA: Diagnosis not present

## 2017-10-07 LAB — URINALYSIS, COMPLETE (UACMP) WITH MICROSCOPIC
BACTERIA UA: NONE SEEN
BILIRUBIN URINE: NEGATIVE
Glucose, UA: NEGATIVE mg/dL
Hgb urine dipstick: NEGATIVE
Ketones, ur: NEGATIVE mg/dL
Leukocytes, UA: NEGATIVE
Nitrite: NEGATIVE
PH: 6 (ref 5.0–8.0)
SPECIFIC GRAVITY, URINE: 1.02 (ref 1.005–1.030)
Squamous Epithelial / LPF: NONE SEEN

## 2017-10-07 MED ORDER — SODIUM CHLORIDE 0.9 % IV SOLN
Freq: Once | INTRAVENOUS | Status: AC
  Administered 2017-10-07: 09:00:00 via INTRAVENOUS
  Filled 2017-10-07: qty 1000

## 2017-10-07 MED ORDER — SODIUM CHLORIDE 0.9 % IV SOLN
510.0000 mg | Freq: Once | INTRAVENOUS | Status: AC
  Administered 2017-10-07: 510 mg via INTRAVENOUS
  Filled 2017-10-07: qty 17

## 2017-10-14 ENCOUNTER — Other Ambulatory Visit: Payer: Self-pay

## 2017-10-14 ENCOUNTER — Inpatient Hospital Stay: Payer: 59

## 2017-10-14 VITALS — BP 143/98 | HR 73 | Temp 97.4°F | Resp 18

## 2017-10-14 DIAGNOSIS — K2270 Barrett's esophagus without dysplasia: Secondary | ICD-10-CM | POA: Diagnosis not present

## 2017-10-14 DIAGNOSIS — E876 Hypokalemia: Secondary | ICD-10-CM | POA: Diagnosis not present

## 2017-10-14 DIAGNOSIS — D508 Other iron deficiency anemias: Secondary | ICD-10-CM

## 2017-10-14 DIAGNOSIS — M109 Gout, unspecified: Secondary | ICD-10-CM | POA: Diagnosis not present

## 2017-10-14 DIAGNOSIS — D500 Iron deficiency anemia secondary to blood loss (chronic): Secondary | ICD-10-CM | POA: Diagnosis not present

## 2017-10-14 DIAGNOSIS — Z79899 Other long term (current) drug therapy: Secondary | ICD-10-CM | POA: Diagnosis not present

## 2017-10-14 DIAGNOSIS — I10 Essential (primary) hypertension: Secondary | ICD-10-CM | POA: Diagnosis not present

## 2017-10-14 DIAGNOSIS — K219 Gastro-esophageal reflux disease without esophagitis: Secondary | ICD-10-CM | POA: Diagnosis not present

## 2017-10-14 MED ORDER — SODIUM CHLORIDE 0.9 % IV SOLN
510.0000 mg | Freq: Once | INTRAVENOUS | Status: AC
  Administered 2017-10-14: 510 mg via INTRAVENOUS
  Filled 2017-10-14: qty 17

## 2017-10-14 MED ORDER — SODIUM CHLORIDE 0.9 % IV SOLN
Freq: Once | INTRAVENOUS | Status: AC
  Administered 2017-10-14: 11:00:00 via INTRAVENOUS
  Filled 2017-10-14: qty 1000

## 2017-10-14 NOTE — Patient Instructions (Signed)

## 2017-10-20 DIAGNOSIS — D508 Other iron deficiency anemias: Secondary | ICD-10-CM | POA: Insufficient documentation

## 2017-10-21 ENCOUNTER — Inpatient Hospital Stay: Payer: 59

## 2017-10-28 ENCOUNTER — Encounter: Payer: Self-pay | Admitting: Family Medicine

## 2017-10-28 ENCOUNTER — Ambulatory Visit (INDEPENDENT_AMBULATORY_CARE_PROVIDER_SITE_OTHER): Payer: 59 | Admitting: Family Medicine

## 2017-10-28 VITALS — BP 156/102 | HR 61 | Temp 99.0°F | Ht 68.0 in | Wt 336.2 lb

## 2017-10-28 DIAGNOSIS — D509 Iron deficiency anemia, unspecified: Secondary | ICD-10-CM | POA: Diagnosis not present

## 2017-10-28 DIAGNOSIS — I10 Essential (primary) hypertension: Secondary | ICD-10-CM

## 2017-10-28 MED ORDER — INDAPAMIDE 2.5 MG PO TABS: 2.5000 mg | ORAL_TABLET | Freq: Every day | ORAL | 1 refills | Status: DC

## 2017-10-28 MED ORDER — AMLODIPINE BESYLATE 5 MG PO TABS: 5.0000 mg | ORAL_TABLET | Freq: Every day | ORAL | 3 refills | Status: DC

## 2017-10-28 NOTE — Assessment & Plan Note (Signed)
Encourage diet and exercise. 

## 2017-10-28 NOTE — Patient Instructions (Signed)
Nice to meet you. We will check lab work today and contact you with the results. Please decrease your indapamide dose to 2.5 mg daily. We will start you on amlodipine. Please contact us in about 2 weeks with your blood pressures

## 2017-10-28 NOTE — Progress Notes (Signed)
Nathaniel Rumps, MD Phone: [phone]  Nathaniel Curry is a 37 y.o. male who presents today for follow-up.  Hypertension: Typically 140s over 80s.  Taking carvedilol and indapamide.  Currently taking 5 mg of indapamide.  Most recent potassium slightly low.  He is currently on potassium supplementation prescribed by his hematologist.  Does report prior workup for pheochromocytoma that was negative.  Previously on significant amounts of blood pressure medications.  No chest pain, shortness of breath, or edema.  Patient has been iron deficient.  He has had evaluation through hematology as well as endoscopy and colonoscopy.  Reports no obvious cause found yet.  He is getting infusions through hematology.  He continues to follow with them.  Patient had not been working on diet and exercise for some time.  He is going to try the keto diet.  He is currently going to the gym 2 times a week.  Social History   Tobacco Use  Smoking Status Never Smoker  Smokeless Tobacco Never Used     ROS see history of present illness  Objective  Physical Exam Vitals:   10/28/17 1606  BP: (!) 156/102  Pulse: 61  Temp: 99 F (37.2 C)  SpO2: 94%    BP Readings from Last 3 Encounters:  10/28/17 (!) 156/102  10/14/17 (!) 143/98  10/07/17 128/82   Wt Readings from Last 3 Encounters:  10/28/17 (!) 336 lb 3.2 oz (152.5 kg)  08/07/17 (!) 320 lb (145.2 kg)  05/29/17 (!) 336 lb 8 oz (152.6 kg)    Physical Exam  Constitutional: No distress.  Cardiovascular: Normal rate, regular rhythm and normal heart sounds.  Pulmonary/Chest: Effort normal and breath sounds normal.  Musculoskeletal: He exhibits no edema.  Neurological: He is alert. Gait normal.  Skin: Skin is warm and dry. He is not diaphoretic.     Assessment/Plan: Please see individual problem list.  Essential (primary) hypertension Better controlled at home.  Still uncontrolled though.  Has become hypokalemic on the higher dose of indapamide.   Will decrease back to 2.5 mg of indapamide.  Start on amlodipine.  Continue carvedilol.  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  He will contact us in 2 weeks to let us know what it is running.  Iron deficiency anemia Patient will continue to follow with hematology.  Morbid obesity ([place]) Encourage diet and exercise.   Maxtyn was seen today for follow-up.  Diagnoses and all orders for this visit:  Essential hypertension -     Basic Metabolic Panel (BMET)  Essential (primary) hypertension  Iron deficiency anemia, unspecified iron deficiency anemia type  Morbid obesity ([place])  Other orders -     amLODipine (NORVASC) 5 MG tablet; Take 1 tablet (5 mg total) by mouth daily. -     indapamide (LOZOL) 2.5 MG tablet; Take 1 tablet (2.5 mg total) by mouth daily.    Orders Placed This Encounter  Procedures  . Basic Metabolic Panel (BMET)    Meds ordered this encounter  Medications  . amLODipine (NORVASC) 5 MG tablet    Sig: Take 1 tablet (5 mg total) by mouth daily.    Dispense:  90 tablet    Refill:  3  . indapamide (LOZOL) 2.5 MG tablet    Sig: Take 1 tablet (2.5 mg total) by mouth daily.    Dispense:  90 tablet    Refill:  1     Nathaniel Rumps, MD [place]

## 2017-10-28 NOTE — Assessment & Plan Note (Signed)
Patient will continue to follow with hematology.

## 2017-10-28 NOTE — Assessment & Plan Note (Signed)
Better controlled at home.  Still uncontrolled though.  Has become hypokalemic on the higher dose of indapamide.  Will decrease back to 2.5 mg of indapamide.  Start on amlodipine.  Continue carvedilol.  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  He will contact us in 2 weeks to let us know what it is running.

## 2017-10-28 NOTE — Progress Notes (Signed)
Pre visit review using our clinic review tool, if applicable. No additional management support is needed unless otherwise documented below in the visit note. 

## 2017-10-29 LAB — BASIC METABOLIC PANEL
BUN: 10 mg/dL (ref 6–23)
CHLORIDE: 102 meq/L (ref 96–112)
CO2: 28 meq/L (ref 19–32)
CREATININE: 1.12 mg/dL (ref 0.40–1.50)
Calcium: 9.7 mg/dL (ref 8.4–10.5)
GFR: 79.02 mL/min (ref >60.00)
GLUCOSE: 83 mg/dL (ref 70–99)
Potassium: 4.1 mEq/L (ref 3.5–5.1)
Sodium: 139 mEq/L (ref 135–145)

## 2017-11-11 ENCOUNTER — Inpatient Hospital Stay: Payer: 59 | Attending: Oncology

## 2017-11-11 DIAGNOSIS — D508 Other iron deficiency anemias: Secondary | ICD-10-CM | POA: Diagnosis not present

## 2017-11-11 DIAGNOSIS — K219 Gastro-esophageal reflux disease without esophagitis: Secondary | ICD-10-CM

## 2017-11-11 DIAGNOSIS — D500 Iron deficiency anemia secondary to blood loss (chronic): Secondary | ICD-10-CM

## 2017-11-11 DIAGNOSIS — K2270 Barrett's esophagus without dysplasia: Secondary | ICD-10-CM

## 2017-11-11 LAB — CBC WITH DIFFERENTIAL/PLATELET
Basophils Absolute: 0.1 10*3/uL (ref 0–0.1)
Basophils Relative: 1 %
EOS ABS: 0.4 10*3/uL (ref 0–0.7)
EOS PCT: 3 %
HCT: 48.6 % (ref 40.0–52.0)
Hemoglobin: 16.8 g/dL (ref 13.0–18.0)
LYMPHS ABS: 3.2 10*3/uL (ref 1.0–3.6)
LYMPHS PCT: 26 %
MCH: 28.5 pg (ref 26.0–34.0)
MCHC: 34.5 g/dL (ref 32.0–36.0)
MCV: 82.7 fL (ref 80.0–100.0)
MONO ABS: 1.1 10*3/uL — AB (ref 0.2–1.0)
MONOS PCT: 9 %
Neutro Abs: 7.4 10*3/uL — ABNORMAL HIGH (ref 1.4–6.5)
Neutrophils Relative %: 61 %
PLATELETS: 345 10*3/uL (ref 150–440)
RBC: 5.89 MIL/uL (ref 4.40–5.90)
RDW: 15.5 % — ABNORMAL HIGH (ref 11.5–14.5)
WBC: 12.3 10*3/uL — ABNORMAL HIGH (ref 3.8–10.6)

## 2017-11-11 LAB — IRON AND TIBC
IRON: 114 ug/dL (ref 45–182)
Saturation Ratios: 28 % (ref 17.9–39.5)
TIBC: 403 ug/dL (ref 250–450)
UIBC: 289 ug/dL

## 2017-11-11 LAB — FERRITIN: FERRITIN: 134 ng/mL (ref 24–336)

## 2017-11-12 ENCOUNTER — Other Ambulatory Visit: Payer: Self-pay | Admitting: Oncology

## 2017-11-12 DIAGNOSIS — D72821 Monocytosis (symptomatic): Secondary | ICD-10-CM

## 2017-11-13 ENCOUNTER — Ambulatory Visit: Payer: Self-pay

## 2017-11-13 ENCOUNTER — Inpatient Hospital Stay: Payer: 59 | Admitting: Oncology

## 2017-11-18 ENCOUNTER — Inpatient Hospital Stay: Payer: 59 | Attending: Oncology

## 2017-11-18 DIAGNOSIS — D72821 Monocytosis (symptomatic): Secondary | ICD-10-CM

## 2017-11-20 LAB — COMP PANEL: LEUKEMIA/LYMPHOMA

## 2017-11-25 LAB — BCR-ABL1 FISH
Cells Analyzed: 200
Cells Counted: 200
PDF LCA: 0

## 2017-11-26 NOTE — Progress Notes (Signed)
[facility] Follow up Visit   Patient Care Team: Leone Haven, MD as PCP - General (Family Medicine)  REASON FOR VISIT Follow up for treatment of iron deficiency anemia.   HISTORY OF PRESENTING ILLNESS: Nathaniel Curry 37 y.o. male with PMH presents for follow up. Pertinent HemOnc questions listed below.  # Iron deficiency anemia: s/p Feraheme x 4 doses. Ferritin improves and decreases, indicating ongoing blood loss.  Celiac panel normal  # Barrett's esophagitis and his recent GI work up listed as below:  EGD and Colonoscopy done in 2017 which revealed Barrett Esophagitis (with stigmata of bleeding/friability was found), and diverticulosis sigmoid and descending colon. 03/13/2016 Capsule study  10/19 2018 upper endoscopy in  which showed esophageal mucosal changes secondary to established short-segment Barrett's disease. Biopsied.- Gastritis and intact sleeve gastrectomy. Normal exam of duodenum Biopsy pathology showed gastroesophagitis with ulceration, negative for fungus, negative for goblet cells, dysplasia and malignancy.     INTERVAL HISTORY Nathaniel Curry is a 37 y.o. male who has above history reviewed by me today presents for follow up visit for management of iron deficiency anemia. He reports fatigue is better after receiving recent two doses of Feraheme. Denies B symptoms.  We discussed about his family history. Mother had colon cancer. His father possibly was recently diagnosed with colon cancer.  He has significant colon family history and he is concerned of genetic syndrome.    Review of Systems  Constitutional: Negative for appetite change, chills, fatigue and unexpected weight change.  HENT:   Negative for hearing loss and nosebleeds.   Respiratory: Negative for chest tightness and shortness of breath.   Cardiovascular: Negative for chest pain.  Gastrointestinal: Negative for nausea.  Endocrine: Negative for hot flashes.  Genitourinary: Negative  for difficulty urinating and dysuria.        Chronic acid reflux  Musculoskeletal: Negative for arthralgias.  Skin: Negative for itching.  Neurological: Negative for dizziness and headaches.  Hematological: Negative for adenopathy.  Psychiatric/Behavioral: The patient is not nervous/anxious.     MEDICAL HISTORY: Past Medical History:  Diagnosis Date  . Allergy   . Anemia   . Barrett's esophagus   . GERD (gastroesophageal reflux disease)   . Gout   . Gout   . History of kidney stones   . Hypertension   . Iron deficiency anemia 05/28/2017  . Nephrolithiasis 02/08/2014  . Obesity     SURGICAL HISTORY: Past Surgical History:  Procedure Laterality Date  . BARIATRIC SURGERY     Gastric sleeve  . CERVICAL FUSION    . COLONOSCOPY WITH PROPOFOL N/A 01/11/2016   Procedure: COLONOSCOPY WITH PROPOFOL;  Surgeon: Lollie Sails, MD;  [facility] END[facility]COPY;  Service: Endoscopy;  Laterality: N/A;  . ESOPHAGOGASTRODUODENOSCOPY  01/11/2016   Procedure: ESOPHAGOGASTRODUODENOSCOPY (EGD);  Surgeon: Lollie Sails, MD;  Location[facility] ENDOSCOPY;  Service: Endoscopy;;  . ESOPHAGOGASTRODUODENOSCOPY (EGD) WITH PROPOFOL N/A 08/07/2017   Procedure: ESOPHAGOGASTRODUODENOSCOPY (EGD) WITH PROPOFOL;  Surgeon: Toledo, Benay Pike, MD;  Location: [facility] ENDOSCOPY;  Service: Gastroenterology;  Laterality: N/A;  . LASIK    . TONSILLECTOMY N/A 08/06/2016   Procedure: TONSILLECTOMY;  Surgeon: Clyde Canterbury, MD;  Location: [facility] ORS;  Service: ENT;  Laterality: N/A;  . UPPER GASTROINTESTINAL ENDOSCOPY      SOCIAL HISTORY: Social History   Socioeconomic History  . Marital status: Single    Spouse name: Not on file  . Number of children: Not on file  . Years of education: Not  on file  . Highest education level: Not on file  Social Needs  . Financial resource strain: Not on file  . Food insecurity - worry: Not on file  . Food insecurity - inability: Not on file  . Transportation needs - medical: Not on  file  . Transportation needs - non-medical: Not on file  Occupational History    Employer: [employer]  Tobacco Use  . Smoking status: Never Smoker  . Smokeless tobacco: Never Used  Substance and Sexual Activity  . Alcohol use: No    Alcohol/week: 0.0 oz  . Drug use: No  . Sexual activity: No  Other Topics Concern  . Not on file  Social History Narrative  . Not on file    FAMILY HISTORY Family History  Problem Relation Age of Onset  . Alcohol abuse Mother   . Cancer Mother        colon  . Hypertension Mother   . Hyperlipidemia Father   . Hypertension Father   . Diabetes Father   . Hypertension Maternal Grandmother   . Alcohol abuse Maternal Grandmother   . Hypertension Maternal Grandfather   . Alcohol abuse Maternal Grandfather   . Hypertension Paternal Grandmother   . Hypertension Paternal Grandfather     ALLERGIES:  has no allergies on file.  MEDICATIONS:  Current Outpatient Medications  Medication Sig Dispense Refill  . amLODipine (NORVASC) 5 MG tablet Take 1 tablet (5 mg total) by mouth daily. 90 tablet 3  . carvedilol (COREG) 6.25 MG tablet Take 1 tablet (6.25 mg total) by mouth 2 (two) times daily with a meal. 180 tablet 1  . indapamide (LOZOL) 2.5 MG tablet Take 1 tablet (2.5 mg total) by mouth daily. 90 tablet 1  . pantoprazole (PROTONIX) 40 MG tablet Take 40 mg by mouth 2 (two) times daily.     . potassium chloride (K-DUR) 10 MEQ tablet Take 1 tablet (10 mEq total) by mouth daily. 30 tablet 0  . sertraline (ZOLOFT) 100 MG tablet Take 1 tablet (100 mg total) by mouth daily. 90 tablet 1  . ferrous sulfate (SLOW IRON) 160 (50 Fe) MG TBCR SR tablet Take 1 tablet (160 mg total) by mouth 2 (two) times daily. 60 each 2   No current facility-administered medications for this visit.     PHYSICAL EXAMINATION:  ECOG PERFORMANCE STATUS: 0 - Asymptomatic   Vitals:   11/27/17 1519 11/27/17 1523  BP:  (!) 148/95  Pulse:  65  Resp: 12   Temp:   97.6 F (36.4 C)    Filed Weights   11/27/17 1519  Weight: (!) 335 lb (152 kg)     Physical Exam  Constitutional: He is well-developed, well-nourished, and in no distress. No distress.  Obese.  HENT:  Head: Normocephalic and atraumatic.  Mouth/Throat: Oropharynx is clear and moist. No oropharyngeal exudate.  Eyes: Conjunctivae and EOM are normal. Pupils are equal, round, and reactive to light. No scleral icterus.  Neck: Normal range of motion. Neck supple.  Cardiovascular: Normal rate and regular rhythm.  No murmur heard. Pulmonary/Chest: Effort normal and breath sounds normal. He has no wheezes. He has no rales.  Abdominal: Soft. Bowel sounds are normal. He exhibits no distension.  Musculoskeletal: Normal range of motion. He exhibits no edema.  Lymphadenopathy:    He has no cervical adenopathy.  Neurological: He is alert. No cranial nerve deficit.  Skin: Skin is warm and dry. No erythema.  Psychiatric: Affect and judgment normal.  LABORATORY DATA: I have personally reviewed the data as listed:  Appointment on 11/18/2017  Component Date Value Ref Range Status  . PATH INTERP XXX-IMP 11/18/2017 Comment   Final   No significant immunophenotypic abnormality detected  . CLINICAL INFO 11/18/2017 Comment   Corrected   Comment: (NOTE) Accompanying CBC dated 11/11/17 shows: WBC count 12.3, Neu 7.4, Lym 3.2, Mon 1.1.   . Misc Source 11/18/2017 Comment   Final   Peripheral blood  . ASSESSMENT OF LEUKOCYTES 11/18/2017 Comment   Final   Comment: (NOTE) No monoclonal B cell population is detected.kappa:lambda ratio 1.4 There is no loss of, or aberrant expression of, the pan T cell antigens to suggest a neoplastic T cell process. CD4:CD8 ratio 1.7 No circulating blasts are detected. There is no immunophenotypic  evidence of abnormal myeloid maturation. Analysis of the leukocyte population shows: granulocytes 72%, monocytes 6%, lymphocytes 22%, blasts <0.5%, B cells 3%, T  cells 17%, NK cells 2%.   . % Viable Cells 11/18/2017 Comment   Corrected   97%  . ANALYSIS AND GATING STRATEGY 11/18/2017 Comment   Final   8 color analysis with CD45/SSC gating  . IMMUNOPHENOTYPING STUDY 11/18/2017 Comment   Final   Comment: (NOTE) CD2       Normal         CD3       Normal CD4       Normal         CD5       Normal CD7       Normal         CD8       Normal CD10      Normal         CD11b     Normal CD13      Normal         CD14      Normal CD16      Normal         CD19      Normal CD20      Normal         CD33      Normal CD34      Normal         CD38      Normal CD45      Normal         CD56      Normal CD57      Normal         CD117     Normal HLA-DR    Normal         KAPPA     Normal LAMBDA    Normal         CD64      Normal   . PATHOLOGIST NAME 11/18/2017 Comment   Final   Henrietta Hoover, M.D.  . COMMENT: 11/18/2017 Comment   Corrected   Comment: (NOTE) Each antibody in this assay was utilized to assess for potential abnormalities of studied cell populations or to characterize identified abnormalities. This test was developed and its performance characteristics determined by [facility].  It has not been cleared or approved by the U.S. Food and Drug Administration. The FDA has determined that such clearance or approval is not necessary. This test is used for clinical purposes.  It should not be regarded as investigational or for research. Performed At: -[facility] [place], [place] [id number] Nechama Guard MD AL:[id number] Performed At: [facility] [address] [place], [place] [id number] Nechama Guard MD GY:[id number] Performed at [facility], [address], [place], [place] [address]   . Specimen Type 11/18/2017 BLOOD   Final  . Cells Counted 11/18/2017 200   Final  . Cells Analyzed 11/18/2017 200   Final  . FISH Result 11/18/2017 Comment:   Final   NORMAL:  NO BCR OR ABL GENE REARRANGEMENT  OBSERVED  . Interpretation 11/18/2017 Comment:   Final   Comment: (NOTE)             nuc ish 9q34(ASS1,ABL1)x2,22q11.2(BCRx2)[200].      The fluorescence in situ hybridization (FISH) study was normal. FISH, using unique sequence DNA probes for the ABL1 and BCR gene regions showed two ABL1 signals (red), two control ASS1 gene signals (aqua) located adjacent to the ABL1 locus at 9q34, and two BCR signals (green) at 22q11.2 in all interphase nuclei examined. There was NO evidence of CML or ALL-associated BCR/ABL1 dual fusion signals in this analysis. .      This analysis is limited to abnormalities detectable by the specific probes included in the study. FISH results should be interpreted within the context of a full cytogenetic analysis and pathology evaluation. [place] Kitchen  .This test was developed and its performance characteristics determined by [facility]). It has not been cleared or approved by the U.S. Food and Drug Administration. The DNA probe vendor for this study was [employer], research (physical sciences))                          .   . Director Review: 11/18/2017 Comment:   Final   Leonette Monarch, PhD, [facility]  . PDF 11/18/2017 .   Corrected   Comment: (NOTE) Performed At: [facility] [address] [place], [place] [id number] Rush Farmer MD [id number] Performed At: [place], [place] [id number] Nechama Guard MD [id number] Performed at [facility], [address], [place], [place] [address]   Appointment on 11/11/2017  Component Date Value Ref Range Status  . Ferritin 11/11/2017 134  24 - 336 ng/mL Final   Performed at [facility], [address] [place], [place] [address]  . Iron 11/11/2017 114  45 - 182 ug/dL Final  . TIBC 11/11/2017 403  250 - 450 ug/dL Final  . Saturation Ratios 11/11/2017 28  17.9 - 39.5 % Final  . UIBC 11/11/2017 289  ug/dL Final   Performed  at [facility], [address], [place], [place] [address]  . WBC 11/11/2017 12.3* 3.8 - 10.6 K/uL Final  . RBC 11/11/2017 5.89  4.40 - 5.90 MIL/uL Final  . Hemoglobin 11/11/2017 16.8  13.0 - 18.0 g/dL Final  . HCT 11/11/2017 48.6  40.0 - 52.0 % Final  . MCV 11/11/2017 82.7  80.0 - 100.0 fL Final  . MCH 11/11/2017 28.5  26.0 - 34.0 pg Final  . MCHC 11/11/2017 34.5  32.0 - 36.0 g/dL Final  . RDW 11/11/2017 15.5* 11.5 - 14.5 % Final  . Platelets 11/11/2017 345  150 - 440 K/uL Final  . Neutrophils Relative % 11/11/2017 61  % Final  . Neutro Abs 11/11/2017 7.4* 1.4 - 6.5 K/uL Final  . Lymphocytes Relative 11/11/2017 26  % Final  . Lymphs Abs 11/11/2017 3.2  1.0 - 3.6 K/uL Final  . Monocytes Relative 11/11/2017 9  % Final  . Monocytes Absolute  11/11/2017 1.1* 0.2 - 1.0 K/uL Final  . Eosinophils Relative 11/11/2017 3  % Final  . Eosinophils Absolute 11/11/2017 0.4  0 - 0.7 K/uL Final  . Basophils Relative 11/11/2017 1  % Final  . Basophils Absolute 11/11/2017 0.1  0 - 0.1 K/uL Final   Performed at [facility], [address] [place], [place] [address]  Office Visit on 10/28/2017  Component Date Value Ref Range Status  . Sodium 10/28/2017 139  135 - 145 mEq/L Final  . Potassium 10/28/2017 4.1  3.5 - 5.1 mEq/L Final  . Chloride 10/28/2017 102  96 - 112 mEq/L Final  . CO2 10/28/2017 28  19 - 32 mEq/L Final  . Glucose, Bld 10/28/2017 83  70 - 99 mg/dL Final  . BUN 10/28/2017 10  6 - 23 mg/dL Final  . Creatinine, Ser 10/28/2017 1.12  0.40 - 1.50 mg/dL Final  . Calcium 10/28/2017 9.7  8.4 - 10.5 mg/dL Final  . GFR 10/28/2017 79.02  >60.00 mL/min Final   # UA negative for hematuria. ASSESSMENT/PLAN 1. Iron deficiency anemia due to chronic blood loss   2. Barrett's esophagus without dysplasia   3. Gastroesophageal reflux disease without esophagitis   4. Current use of proton pump inhibitor   5. Leukocytosis, unspecified type    # Lab results was reviewed with  patient. Ferritin improved after repeat 2 dose of Feraheme. Hemoglobin stable.  Patient is motivated to try oral iron tablets. Sent Rx of Slow Iron 185m BID to pharmacy.   # Source of blood loss, recent endoscopy last year negative for malignancy and active bleeding. Chronic barrett's esophagitis, Recommend patient to follow up with GI for small bowel capsule study.  # Chronic leukocytosis with neutrophilia and monocytosis. Flowcytometry negative, BCR-Abl negative, will check Jak 2 mutation with reflex.  # Family history of colon cancer: refer to genetic counselor for genetic testing.   # Will repeat CBC CMP iron TIBC ferratin 3 months done prior to MD visit . All questions were answered. The patient knows to call the clinic with any problems, questions or concerns.   ZEarlie Server MD, PhD Hematology Oncology CWalnut Hill Medical Centerat AProctor Community HospitalPager- [phone]/05/2018

## 2017-11-27 ENCOUNTER — Other Ambulatory Visit: Payer: Self-pay

## 2017-11-27 ENCOUNTER — Encounter: Payer: Self-pay | Admitting: Oncology

## 2017-11-27 ENCOUNTER — Inpatient Hospital Stay: Payer: 59 | Attending: Oncology | Admitting: Oncology

## 2017-11-27 VITALS — BP 148/95 | HR 65 | Temp 97.6°F | Resp 12 | Ht 68.0 in | Wt 335.0 lb

## 2017-11-27 DIAGNOSIS — D72829 Elevated white blood cell count, unspecified: Secondary | ICD-10-CM

## 2017-11-27 DIAGNOSIS — Z800 Family history of malignant neoplasm of digestive organs: Secondary | ICD-10-CM | POA: Diagnosis not present

## 2017-11-27 DIAGNOSIS — Z79899 Other long term (current) drug therapy: Secondary | ICD-10-CM

## 2017-11-27 DIAGNOSIS — K219 Gastro-esophageal reflux disease without esophagitis: Secondary | ICD-10-CM | POA: Diagnosis not present

## 2017-11-27 DIAGNOSIS — D500 Iron deficiency anemia secondary to blood loss (chronic): Secondary | ICD-10-CM | POA: Insufficient documentation

## 2017-11-27 DIAGNOSIS — K2270 Barrett's esophagus without dysplasia: Secondary | ICD-10-CM | POA: Insufficient documentation

## 2017-11-27 MED ORDER — FERROUS SULFATE DRIED ER 160 (50 FE) MG PO TBCR: 160.0000 mg | EXTENDED_RELEASE_TABLET | Freq: Two times a day (BID) | ORAL | 2 refills | Status: DC

## 2017-11-27 NOTE — Progress Notes (Signed)
Patient here for follow up reports feeling fatifued.

## 2017-11-30 ENCOUNTER — Telehealth: Payer: 59 | Admitting: Family

## 2017-11-30 ENCOUNTER — Telehealth: Payer: Self-pay | Admitting: Genetic Counselor

## 2017-11-30 ENCOUNTER — Other Ambulatory Visit: Payer: Self-pay | Admitting: *Deleted

## 2017-11-30 ENCOUNTER — Inpatient Hospital Stay: Payer: 59

## 2017-11-30 ENCOUNTER — Other Ambulatory Visit: Payer: Self-pay | Admitting: Oncology

## 2017-11-30 DIAGNOSIS — D500 Iron deficiency anemia secondary to blood loss (chronic): Secondary | ICD-10-CM | POA: Diagnosis not present

## 2017-11-30 DIAGNOSIS — R300 Dysuria: Secondary | ICD-10-CM

## 2017-11-30 DIAGNOSIS — K2270 Barrett's esophagus without dysplasia: Secondary | ICD-10-CM | POA: Diagnosis not present

## 2017-11-30 DIAGNOSIS — Z800 Family history of malignant neoplasm of digestive organs: Secondary | ICD-10-CM | POA: Diagnosis not present

## 2017-11-30 DIAGNOSIS — R319 Hematuria, unspecified: Secondary | ICD-10-CM

## 2017-11-30 LAB — URINALYSIS, COMPLETE (UACMP) WITH MICROSCOPIC
Bilirubin Urine: NEGATIVE
Glucose, UA: NEGATIVE mg/dL
Ketones, ur: NEGATIVE mg/dL
NITRITE: POSITIVE — AB
PH: 5 (ref 5.0–8.0)
Protein, ur: 30 mg/dL — AB
SPECIFIC GRAVITY, URINE: 1.018 (ref 1.005–1.030)

## 2017-11-30 MED ORDER — CIPROFLOXACIN HCL 500 MG PO TABS: 500.0000 mg | ORAL_TABLET | Freq: Two times a day (BID) | ORAL | 0 refills | Status: DC

## 2017-11-30 NOTE — Telephone Encounter (Signed)
Dr. Tasia Catchings is referring Mr. Nathaniel Curry for genetic counseling due to a family history of cancer. I left him a message to call and schedule this telegenetics visit to be done by phone at his convenience.   Steele Berg, [place], [place] Genetic Counselor Phone: [phone]

## 2017-11-30 NOTE — Progress Notes (Signed)
Based on what you shared with me it looks like you have a serious condition that should be evaluated in a face to face office visit.  NOTE: If you entered your credit card information for this eVisit, you will not be charged. You may see a "hold" on your card for the $30 but that hold will drop off and you will not have a charge processed.  If you are having a true medical emergency please call 911.  If you need an urgent face to face visit, [facility] has four urgent care centers for your convenience.  If you need care fast and have a high deductible or no insurance consider:   [email or web address] to reserve your spot online an avoid wait times  [facility] [address], [address] [place], [place] [address] 8 am to 8 pm Monday-Friday 10 am to 4 pm Saturday-Sunday *Across the street from [employer]  [facility]  [address] [place], [address] 8 am to 5 pm Monday-Friday * In the [facility]   The following sites will take your  insurance:  . [facility]  [phone] Get Driving Directions Find a Provider at this Location  [address] [place], [place] [address] . 10 am to 8 pm Monday-Friday . 12 pm to 8 pm Saturday-Sunday   . [facility]  [phone] Get Driving Directions Find a Provider at this Location  [address], [address] [place], [place] [address] . 8 am to 8 pm Monday-Friday . 9 am to 6 pm Saturday . 11 am to 6 pm Sunday   .  Urgent Care at [facility] [place]  [phone] Get Driving Directions  [address] [place],  [address] . 8 am to 8 pm Monday-Friday . 8 am to 4 pm Saturday-Sunday   Your e-visit answers were reviewed by a board certified advanced clinical practitioner to complete your personal care plan.  Thank you for using e-Visits.  

## 2017-12-01 ENCOUNTER — Telehealth: Payer: Self-pay | Admitting: Oncology

## 2017-12-01 NOTE — Telephone Encounter (Signed)
Patient reports dysuria, flank pain, having fever.  UA and culture were obtained. UA is positive for hematuria, pyuria, nitrates, indicating UTI.  Rx of Cipro was sent to pharmacy. Discussed with patient about UA results. Culture is pending.  Advise hydration, rest and follow up with primary care physician. He voices understanding.

## 2017-12-02 LAB — URINE CULTURE: Culture: >=100000 — AB

## 2018-02-01 ENCOUNTER — Other Ambulatory Visit: Payer: Self-pay | Admitting: Family Medicine

## 2018-02-18 ENCOUNTER — Inpatient Hospital Stay: Payer: 59 | Attending: Oncology

## 2018-02-18 DIAGNOSIS — K219 Gastro-esophageal reflux disease without esophagitis: Secondary | ICD-10-CM | POA: Insufficient documentation

## 2018-02-18 DIAGNOSIS — K2270 Barrett's esophagus without dysplasia: Secondary | ICD-10-CM | POA: Insufficient documentation

## 2018-02-18 DIAGNOSIS — D500 Iron deficiency anemia secondary to blood loss (chronic): Secondary | ICD-10-CM | POA: Insufficient documentation

## 2018-02-18 DIAGNOSIS — D72829 Elevated white blood cell count, unspecified: Secondary | ICD-10-CM | POA: Insufficient documentation

## 2018-02-18 DIAGNOSIS — Z79899 Other long term (current) drug therapy: Secondary | ICD-10-CM | POA: Insufficient documentation

## 2018-02-18 DIAGNOSIS — I10 Essential (primary) hypertension: Secondary | ICD-10-CM | POA: Insufficient documentation

## 2018-02-18 DIAGNOSIS — D72821 Monocytosis (symptomatic): Secondary | ICD-10-CM | POA: Insufficient documentation

## 2018-02-23 ENCOUNTER — Inpatient Hospital Stay: Payer: 59

## 2018-02-23 ENCOUNTER — Other Ambulatory Visit: Payer: Self-pay

## 2018-02-23 DIAGNOSIS — D500 Iron deficiency anemia secondary to blood loss (chronic): Secondary | ICD-10-CM | POA: Diagnosis not present

## 2018-02-23 DIAGNOSIS — K219 Gastro-esophageal reflux disease without esophagitis: Secondary | ICD-10-CM | POA: Diagnosis not present

## 2018-02-23 DIAGNOSIS — Z79899 Other long term (current) drug therapy: Secondary | ICD-10-CM | POA: Diagnosis not present

## 2018-02-23 DIAGNOSIS — D72821 Monocytosis (symptomatic): Secondary | ICD-10-CM | POA: Diagnosis not present

## 2018-02-23 DIAGNOSIS — I10 Essential (primary) hypertension: Secondary | ICD-10-CM | POA: Diagnosis not present

## 2018-02-23 DIAGNOSIS — K2270 Barrett's esophagus without dysplasia: Secondary | ICD-10-CM | POA: Diagnosis not present

## 2018-02-23 DIAGNOSIS — D72829 Elevated white blood cell count, unspecified: Secondary | ICD-10-CM | POA: Diagnosis not present

## 2018-02-23 LAB — CBC WITH DIFFERENTIAL/PLATELET
BASOS ABS: 0.1 10*3/uL (ref 0–0.1)
Basophils Relative: 1 %
Eosinophils Absolute: 0.5 10*3/uL (ref 0–0.7)
Eosinophils Relative: 4 %
HCT: 46.6 % (ref 40.0–52.0)
Hemoglobin: 16.2 g/dL (ref 13.0–18.0)
LYMPHS PCT: 27 %
Lymphs Abs: 2.9 10*3/uL (ref 1.0–3.6)
MCH: 29.7 pg (ref 26.0–34.0)
MCHC: 34.8 g/dL (ref 32.0–36.0)
MCV: 85.1 fL (ref 80.0–100.0)
Monocytes Absolute: 0.8 10*3/uL (ref 0.2–1.0)
Monocytes Relative: 7 %
Neutro Abs: 6.5 10*3/uL (ref 1.4–6.5)
Neutrophils Relative %: 61 %
PLATELETS: 339 10*3/uL (ref 150–440)
RBC: 5.47 MIL/uL (ref 4.40–5.90)
RDW: 13.4 % (ref 11.5–14.5)
WBC: 10.7 10*3/uL — AB (ref 3.8–10.6)

## 2018-02-23 LAB — IRON AND TIBC
Iron: 109 ug/dL (ref 45–182)
Saturation Ratios: 27 % (ref 17.9–39.5)
TIBC: 402 ug/dL (ref 250–450)
UIBC: 293 ug/dL

## 2018-02-23 LAB — FERRITIN: Ferritin: 63 ng/mL (ref 24–336)

## 2018-03-01 LAB — CALR + JAK2 E12-15 + MPL (REFLEXED)

## 2018-03-01 LAB — JAK2 V617F, W REFLEX TO CALR/E12/MPL

## 2018-03-02 ENCOUNTER — Other Ambulatory Visit: Payer: Self-pay

## 2018-03-02 ENCOUNTER — Inpatient Hospital Stay (HOSPITAL_BASED_OUTPATIENT_CLINIC_OR_DEPARTMENT_OTHER): Payer: 59 | Admitting: Oncology

## 2018-03-02 ENCOUNTER — Encounter: Payer: Self-pay | Admitting: Family Medicine

## 2018-03-02 ENCOUNTER — Encounter: Payer: Self-pay | Admitting: Oncology

## 2018-03-02 VITALS — BP 168/106 | HR 66 | Temp 97.1°F | Resp 18

## 2018-03-02 DIAGNOSIS — D500 Iron deficiency anemia secondary to blood loss (chronic): Secondary | ICD-10-CM

## 2018-03-02 DIAGNOSIS — D72829 Elevated white blood cell count, unspecified: Secondary | ICD-10-CM | POA: Diagnosis not present

## 2018-03-02 DIAGNOSIS — K219 Gastro-esophageal reflux disease without esophagitis: Secondary | ICD-10-CM

## 2018-03-02 DIAGNOSIS — D72821 Monocytosis (symptomatic): Secondary | ICD-10-CM | POA: Diagnosis not present

## 2018-03-02 DIAGNOSIS — Z79899 Other long term (current) drug therapy: Secondary | ICD-10-CM

## 2018-03-02 DIAGNOSIS — K2270 Barrett's esophagus without dysplasia: Secondary | ICD-10-CM

## 2018-03-02 DIAGNOSIS — I10 Essential (primary) hypertension: Secondary | ICD-10-CM

## 2018-03-02 NOTE — Progress Notes (Signed)
Here for follow up. Pt stated energy level has been low. Stated " feels like he always has the flu"

## 2018-03-02 NOTE — Progress Notes (Signed)
Nathaniel Curry Follow up Visit   Patient Care Team: Leone Haven, MD as PCP - General (Family Medicine)  REASON FOR VISIT Follow up for treatment of iron deficiency anemia.   HISTORY OF PRESENTING ILLNESS: Nathaniel Curry 37 y.o. male with PMH presents for follow up. Pertinent HemOnc questions listed below.  # Iron deficiency anemia: s/p Feraheme x 4 doses. Ferritin improves and decreases, indicating ongoing blood loss.  Celiac panel normal  # Barrett's esophagitis and his recent GI work up listed as below:  EGD and Colonoscopy done in 2017 which revealed Barrett Esophagitis (with stigmata of bleeding/friability was found), and diverticulosis sigmoid and descending colon. 03/13/2016 Capsule study  10/19 2018 upper endoscopy in  which showed esophageal mucosal changes secondary to established short-segment Barrett's disease. Biopsied.- Gastritis and intact sleeve gastrectomy. Normal exam of duodenum Biopsy pathology showed gastroesophagitis with ulceration, negative for fungus, negative for goblet cells, dysplasia and malignancy.   We discussed about his family history. Mother had colon Curry. His father possibly was recently diagnosed with colon Curry.  He has significant colon family history and he is concerned of genetic syndrome.  INTERVAL HISTORY Nathaniel Curry is a 37 y.o. male who has above history reviewed by me today presents for follow up visit for management of iron deficiency anemia. He felt better immediately after IV Feraheme and then gradually feels at baseline again.  Referred for genetic counseling. Counselor left message at his phone.   He takes OTC Vitron C daily.      Review of Systems  Constitutional: Positive for fatigue. Negative for appetite change, chills, diaphoresis, fever and unexpected weight change.  HENT:   Negative for hearing loss, lump/mass, nosebleeds and sore throat.   Eyes: Negative for eye problems and icterus.  Respiratory:  Negative for chest tightness, cough, hemoptysis, shortness of breath and wheezing.   Cardiovascular: Negative for chest pain and leg swelling.  Gastrointestinal: Negative for abdominal distention, abdominal pain, blood in stool, diarrhea, nausea and rectal pain.  Endocrine: Negative for hot flashes.  Genitourinary: Negative for bladder incontinence, difficulty urinating, dysuria, frequency, hematuria and nocturia.        Chronic acid reflux  Musculoskeletal: Negative for arthralgias, back pain, flank pain, gait problem and myalgias.  Skin: Negative for itching and rash.  Neurological: Negative for dizziness, gait problem, headaches, numbness and seizures.  Hematological: Negative for adenopathy. Does not bruise/bleed easily.  Psychiatric/Behavioral: Negative for confusion and decreased concentration. The patient is not nervous/anxious.     MEDICAL HISTORY: Past Medical History:  Diagnosis Date  . Allergy   . Anemia   . Barrett's esophagus   . GERD (gastroesophageal reflux disease)   . Gout   . Gout   . History of kidney stones   . Hypertension   . Iron deficiency anemia 05/28/2017  . Nephrolithiasis 02/08/2014  . Obesity     SURGICAL HISTORY: Past Surgical History:  Procedure Laterality Date  . BARIATRIC SURGERY     Gastric sleeve  . CERVICAL FUSION    . COLONOSCOPY WITH PROPOFOL N/A 01/11/2016   Procedure: COLONOSCOPY WITH PROPOFOL;  Surgeon: Lollie Sails, MD;  Location: [facility] ENDOSCOPY;  Service: Endoscopy;  Laterality: N/A;  . ESOPHAGOGASTRODUODENOSCOPY  01/11/2016   Procedure: ESOPHAGOGASTRODUODENOSCOPY (EGD);  Surgeon: Lollie Sails, MD;  Location: [facility] ENDOSCOPY;  Service: Endoscopy;;  . ESOPHAGOGASTRODUODENOSCOPY (EGD) WITH PROPOFOL N/A 08/07/2017   Procedure: ESOPHAGOGASTRODUODENOSCOPY (EGD) WITH PROPOFOL;  Surgeon: Toledo, Benay Pike, MD;  Location: [facility] ENDOSCOPY;  Service: Gastroenterology;  Laterality: N/A;  . LASIK    . TONSILLECTOMY N/A 08/06/2016    Procedure: TONSILLECTOMY;  Surgeon: Clyde Canterbury, MD;  Location: [facility] ORS;  Service: ENT;  Laterality: N/A;  . UPPER GASTROINTESTINAL ENDOSCOPY      SOCIAL HISTORY: Social History   Socioeconomic History  . Marital status: Single    Spouse name: Not on file  . Number of children: Not on file  . Years of education: Not on file  . Highest education level: Not on file  Occupational History    Employer: [employer]  Social Needs  . Financial resource strain: Not on file  . Food insecurity:    Worry: Not on file    Inability: Not on file  . Transportation needs:    Medical: Not on file    Non-medical: Not on file  Tobacco Use  . Smoking status: Never Smoker  . Smokeless tobacco: Never Used  Substance and Sexual Activity  . Alcohol use: No    Alcohol/week: 0.0 oz  . Drug use: No  . Sexual activity: Never  Lifestyle  . Physical activity:    Days per week: Not on file    Minutes per session: Not on file  . Stress: Not on file  Relationships  . Social connections:    Talks on phone: Not on file    Gets together: Not on file    Attends religious service: Not on file    Active member of club or organization: Not on file    Attends meetings of clubs or organizations: Not on file    Relationship status: Not on file  . Intimate partner violence:    Fear of current or ex partner: Not on file    Emotionally abused: Not on file    Physically abused: Not on file    Forced sexual activity: Not on file  Other Topics Concern  . Not on file  Social History Narrative  . Not on file    FAMILY HISTORY Family History  Problem Relation Age of Onset  . Alcohol abuse Mother   . Curry Mother        colon  . Hypertension Mother   . Hyperlipidemia Father   . Hypertension Father   . Diabetes Father   . Hypertension Maternal Grandmother   . Alcohol abuse Maternal Grandmother   . Hypertension Maternal Grandfather   . Alcohol abuse Maternal Grandfather   .  Hypertension Paternal Grandmother   . Hypertension Paternal Grandfather     ALLERGIES:  has No Known Allergies.  MEDICATIONS:  Current Outpatient Medications  Medication Sig Dispense Refill  . amLODipine (NORVASC) 5 MG tablet Take 1 tablet (5 mg total) by mouth daily. 90 tablet 3  . carvedilol (COREG) 6.25 MG tablet TAKE 1 TABLET (6.25 MG TOTAL) BY MOUTH 2 (TWO) TIMES DAILY WITH A MEAL. 180 tablet 1  . indapamide (LOZOL) 2.5 MG tablet Take 1 tablet (2.5 mg total) by mouth daily. 90 tablet 1  . Iron-Vitamin C (VITRON-C) 65-125 MG TABS Take by mouth.    . pantoprazole (PROTONIX) 40 MG tablet Take 40 mg by mouth 2 (two) times daily.     . potassium chloride (K-DUR) 10 MEQ tablet Take 1 tablet (10 mEq total) by mouth daily. 30 tablet 0  . sertraline (ZOLOFT) 100 MG tablet TAKE 1 TABLET (100 MG TOTAL) BY MOUTH DAILY. 90 tablet 1   No current facility-administered medications for this visit.  PHYSICAL EXAMINATION:  ECOG PERFORMANCE STATUS: 0 - Asymptomatic   Vitals:   03/02/18 1556  BP: (!) 168/106  Pulse: 66  Resp: 18  Temp: (!) 97.1 F (36.2 C)    Filed Weights     Physical Exam  Constitutional: He is oriented to person, place, and time and well-developed, well-nourished, and in no distress. No distress.  Obese.  HENT:  Head: Normocephalic and atraumatic.  Nose: Nose normal.  Mouth/Throat: Oropharynx is clear and moist. No oropharyngeal exudate.  Eyes: Pupils are equal, round, and reactive to light. Conjunctivae and EOM are normal. Left eye exhibits no discharge. No scleral icterus.  Neck: Normal range of motion. Neck supple. No JVD present.  Cardiovascular: Normal rate, regular rhythm and normal heart sounds.  No murmur heard. Pulmonary/Chest: Effort normal and breath sounds normal. No respiratory distress. He has no wheezes. He has no rales. He exhibits no tenderness.  Abdominal: Soft. Bowel sounds are normal. He exhibits no distension and no mass. There is no  tenderness. There is no rebound.  Musculoskeletal: Normal range of motion. He exhibits no edema or tenderness.  Lymphadenopathy:    He has no cervical adenopathy.  Neurological: He is alert and oriented to person, place, and time. No cranial nerve deficit. He exhibits normal muscle tone. Coordination normal.  Skin: Skin is warm and dry. No rash noted. He is not diaphoretic. No erythema.  Psychiatric: Affect and judgment normal.      LABORATORY DATA: I have personally reviewed the data as listed:  Orders Only on 02/23/2018  Component Date Value Ref Range Status  . JAK2 GenotypR 02/23/2018 Comment   Final   Comment: (NOTE) Result: NEGATIVE for the JAK2 V617F mutation. Interpretation:  The G to T nucleotide change encoding the V617F mutation was not detected.  This result does not rule out the presence of the JAK2 mutation at a level below the sensitivity of detection of this assay, or the presence of other mutations within JAK2 not detected by this assay.  This result does not rule out a diagnosis of polycythemia vera, essential thrombocythemia or idiopathic myelofibrosis as the V617F mutation is not detected in all patients with these disorders.   Marland Kitchen BACKGROUND: 02/23/2018 Comment   Final   Comment: (NOTE) JAK2 is a cytoplasmic tyrosine kinase with a key role in signal transduction from multiple hematopoietic growth factor receptors. A point mutation within exon 14 of the JAK2 gene ([id number]) encoding a valine to phenylalanine substitution at position 617 of the JAK2 protein (V617F) has been identified in most patients with polycythemia vera, and in about half of those with either essential thrombocythemia or idiopathic myelofibrosis. The V617F has also been detected, although infrequently, in other myeloid disorders such as chronic myelomonocytic leukemia and chronic neutrophilic luekemia. V617F is an acquired mutation that alters a highly conserved valine present in the negative  regulatory JH2 domain of the JAK2 protein and is predicted to dysregulate kinase activity. Methodology: Total genomic DNA was extracted and subjected to TaqMan real-time PCR amplification/detection. Two amplification products per sample were monitored by real-time PCR using primers/probes s                          pecific to JAK2 wild type (WT) and JAK2 mutant V617F. The ABI7900 Absolute Quantitation software will compare the patient specimen valuse to the standard curves and generate percent values for wild type and mutant type. In vitro studies have indicated that this assay has  an analytical sensitivity of 1%. References: Baxter EJ, Scott Phineas Real, et al. Acquired mutation of the tyrosine kinase JAK2 in human myeloproliferative disorders. Lancet. 2005 Mar 19-25; 365(9464):1054-1061. Alfonso Ramus Couedic JP. A unique clonal JAK2 mutation leading to constitutive signaling causes polycythaemia vera. Nature. 2005 Apr 28; 434(7037):1144-1148. Kralovics R, Passamonti F, Buser AS, et al. A gain-of-function mutation of JAK2 in myeloproliferative disorders. N Engl J Med. 2005 Apr 28; 352(17):1779-1790.   . Director Review, JAK2 02/23/2018 Comment   Final   Comment: (NOTE) Constance Goltz, PhD, [facility]               Director, [place] [facility] [place], [place]               [phone] This test was developed and its performance characteristics determined by [facility]. It has not been cleared or approved by the Food and Drug Administration.   Marland Kitchen REFLEX: 02/23/2018 Comment   Final   Comment: (NOTE) Reflex to CALR Mutation Analysis, JAK2 Exon 12-15 Mutation Analysis, and MPL Mutation Analysis is indicated.   Marland Kitchen Extraction 02/23/2018 Completed   Corrected   Comment: (NOTE) Performed At: [facility] [address] [place], [place] [id number] Nechama Guard MD  FT:[id number] Performed At: [facility] [place], [place] [id number] Nechama Guard MD TD:[id number] Performed at [facility], [address], [place], [place] [address]   . Ferritin 02/23/2018 63  24 - 336 ng/mL Final   Performed at [facility], [address] [place], [place] [address]  . Iron 02/23/2018 109  45 - 182 ug/dL Final  . TIBC 02/23/2018 402  250 - 450 ug/dL Final  . Saturation Ratios 02/23/2018 27  17.9 - 39.5 % Final  . UIBC 02/23/2018 293  ug/dL Final   Performed at [facility], [address], [place], [place] [address]  . WBC 02/23/2018 10.7* 3.8 - 10.6 K/uL Final  . RBC 02/23/2018 5.47  4.40 - 5.90 MIL/uL Final  . Hemoglobin 02/23/2018 16.2  13.0 - 18.0 g/dL Final  . HCT 02/23/2018 46.6  40.0 - 52.0 % Final  . MCV 02/23/2018 85.1  80.0 - 100.0 fL Final  . MCH 02/23/2018 29.7  26.0 - 34.0 pg Final  . MCHC 02/23/2018 34.8  32.0 - 36.0 g/dL Final  . RDW 02/23/2018 13.4  11.5 - 14.5 % Final  . Platelets 02/23/2018 339  150 - 440 K/uL Final  . Neutrophils Relative % 02/23/2018 61  % Final  . Neutro Abs 02/23/2018 6.5  1.4 - 6.5 K/uL Final  . Lymphocytes Relative 02/23/2018 27  % Final  . Lymphs Abs 02/23/2018 2.9  1.0 - 3.6 K/uL Final  . Monocytes Relative 02/23/2018 7  % Final  . Monocytes Absolute 02/23/2018 0.8  0.2 - 1.0 K/uL Final  . Eosinophils Relative 02/23/2018 4  % Final  . Eosinophils Absolute 02/23/2018 0.5  0 - 0.7 K/uL Final  . Basophils Relative 02/23/2018 1  % Final  . Basophils Absolute 02/23/2018 0.1  0 - 0.1 K/uL Final   Performed at [facility], [address] [place], [place] [address]  . CALR Mutation Detection  Result 02/23/2018 Comment   Final   Comment: (NOTE) NEGATIVE No insertions or deletions were detected within the analyzed region of the calreticulin (CALR) gene. A negative result does not entirely exclude the possibility of a clonal population carrying CALR gene  mutations that are not covered by this assay. Results should be interpreted in conjunction with clinical and laboratory findings for the most accurate interpretation.   . Background: 02/23/2018 Comment   Final   Comment: (NOTE) The calcium-binding endoplasmic reticulin chaperone protein, calreticulin (CALR), is somatically mutated in approximately 70% of patients with JAK2-negative essential thrombocythemia (ET) and 60- 88% of patients with JAK2-negative primary myelofibrosis(PMF). Only a minority of patients (approximately 8%) with myelodysplasia have mutations in  CALR gene. CALR mutations are rarely detected in patients with de novo acute myeloid leukemia, chronic myelogenous leukemia, lymphoid leukemia, or solid tumors. CALR mutations are not detected in polycythemia and generally appear to be mutually exclusive with JAK2 mutations and MPL mutations. The majority of mutational changes involve a variety of insertion or deletion mutations in exon 9 of the calreticulin gene: approximately 53% of all CALR mutations are a 52 bp deletion (type-1) while the second most prevalent mutation (approximately 32%) contains a 5 bp insertion (type-2). Other mutations (non-type 1 or type 2) are seen                           in a small minority of cases. CALR mutations in PMF tend to be associated with a favorable prognosis compared to JAK2 V617F mutations, whereas primary myelofibrosis negative for CALR, JAK2 V617F and MPL mutations (so-called triple negative) is associated with a poor prognosis and shorter survival. The detection of a CALR gene mutation aids in the specific diagnosis of a myeloproliferative neoplasm, and help distinguish this clonal disease from a benign reactive process.   . Methodology: 02/23/2018 Comment   Final   Comment: (NOTE) Genomic DNA was isolated from the provided specimen. Polymerase chain reaction (PCR) of exon 9 of the CALR gene was performed with specific  fluorescent-labeled primers, and the PCR product was analyzed by capillary gel electrophoresis to determine the size of the PCR products. This PCR assay is capable of detecting a mutant cell population with a sensitivity of 5 mutant cells per 100 normal cells. A negative result does not exclude the presence of a myeloproliferative disorder or other neoplastic process. This test was developed and its performance characteristics determined by [facility]. It has not been cleared or approved by the Food and Drug Administration. The FDA has determined that such clearance or approval is not necessary.   . References: 02/23/2018 Comment   Final   Comment: (NOTE) 1. Klampfel, T. et al. (2013) Somatic mutations of calreticulin in   myeloproliferative neoplasms. New Engl. J. Med. 329:5188-4166. 2. Haynes Kerns et al. (2013) Somatic CALR mutations in   myeloproliferative neoplasms with nonmutated JAK2. New Engl. J.   Med. [phone].   Marland Kitchen Director Review 02/23/2018 Comment   Final   Comment: (NOTE) Constance Goltz, PhD, [facility]               Director, [place] for [place], [place]  [phone]   . JAK2 Exons 12-15 Mut Det PCR: 02/23/2018 Comment   Final   Comment: (NOTE) NEGATIVE JAK2 mutations were not detected in exons 12, 13, 14 and 15. This result does not rule out the presence of JAK2 mutation at a level below the detection sensitivity of this assay, the presence of other mutations outside the analyzed region of the JAK2 gene, or the presence of a myeloproliferative or other neoplasm. Result must be correlated with other clinical data for the most accurate diagnosis.   Marland Kitchen BACKGROUND: 02/23/2018 Comment   Final   Comment: (NOTE) JAK2 V617F mutation is detected in patients with polycythemia vera (95%), essential thrombocythemia (50%) and primary myelofibrosis (50%). A  small percentage of JAK2 mutation positive patients (3.3%) contain other non-V617F mutations within exons 12 to 15. The detection of a JAK2 gene mutation aids in the specific diagnosis of a myeloproliferative neoplasm, and help distinguish this clonal disease from a benign reactive process.   . Method 02/23/2018 Comment   Final   Comment: (NOTE) Total RNA was purified from the provided specimen. The JAK2 gene region covering exons 12 to 15 was subjected to reverse- transcription coupled PCR amplification, and bi-directional sequencing to identify sequence variations. This assay has a sensitivity to detect approximately 15% population of cells containing the JAK2 mutations in a background of non-mutant cells. This test was developed and its performance characteristics determined by [facility]. It has not been cleared or approved by the Food and Drug Administration.   . References 02/23/2018 Comment   Final   Comment: (NOTE) Algasham, N. et al. Detection of mutations in JAK2 exons 12-15 by Sanger sequencing. Int J Lab Hemato. 2015, 38:34-41. Joelene Millin al. Mutation profile of JAK2 transcripts in patients with chronic myeloproliferative neoplasias. J Mol Diagn. 2009, 11:49-53.   Marland Kitchen DIRECTOR REVIEW: 02/23/2018 Comment   Final   Comment: (NOTE) Loni Muse, PhD  Director, [place] [facility]  Research [place], [place] [address]  [phone]   . MPL MUTATION ANALYSIS RESULT: 02/23/2018 Comment   Final   Comment: (NOTE) No MPL mutation was identified in the provided specimen of this individual. Results should be interpreted in conjunction with clinical and other laboratory findings for the most accurate interpretation.   Marland Kitchen BACKGROUND: 02/23/2018 Comment   Final   Comment: (NOTE) MPL (myeloproliferative leukemia virus oncogene homology) belongs to the hematopoietin superfamily and enables its ligand thrombopoietin to facilitate both global  hematopoiesis and megakaryocyte growth and differentiation. MPL W515 mutations are present in patients with primary myelofibrosis (PMF) and essential thrombocythemia (ET) at a frequency of approximately 5% and 1% respectively. The S505 mutation is detected in patients with hereditary thrombocythemia.   Marland Kitchen METHODOLOGY: 02/23/2018 Comment   Final   Comment: (NOTE) Genomic DNA was purified from the provided specimen. MPL gene region covering the S505N and W515L/K mutations were subjected to PCR amplification and bi-directional sequencing in duplicate to identify sequence variations. This assay has a sensitivity to detect approximately 20-25% population of cells containing the MPL mutations in a background of non-mutant cells. This assay will not detect the mutation below the sensitivity of this assay. Molecular- based testing is highly accurate, but as in any laboratory test, rare diagnostic errors may occur.   Marland Kitchen REFERENCES: 02/23/2018 Comment   Final   Comment: (NOTE) 1. Pardanani AD, et al. (2006). MPL515 mutations in   myeloproliferative and other myeloid disorders: a study   of 1182 patients. Blood [id number]. 2.  Andre Lefort and Levine RL. (2008). JAK2 and MPL   mutations in myeloproliferative neoplasms: discovery and   science. Leukemia 22:1813-1817. 3. Juline Patch, et al. (2009). Evidence for a founder effect   of the MPL-S505N mutation in eight [place] pedigrees with   hereditary thrombocythemia. Haematologica 94(10):1368-   0962.   Marland Kitchen DIRECTOR REVIEW: 02/23/2018 Comment   Final   Comment: (NOTE) Loni Muse, PhD  Director, [place] [facility] [place], [place] [address]  [phone] This test was developed and its performance characteristics determined by [facility]. It has not been cleared or approved by the Food and Drug Administration.   . Extraction 02/23/2018 Comment   Final   Comment: (NOTE) This sample has  been received and DNA extraction has been performed. Performed At: [facility] [address] [place], [place] [id number] Nechama Guard MD TZ:[id number] Performed At: [facility] [address] [place], [place] [id number] Nechama Guard MD KZ:[id number] Performed at [facility], [address], [place], [place] [address]    # UA negative for hematuria. ASSESSMENT/PLAN 1. Iron deficiency anemia due to chronic blood loss   2. Barrett's esophagus without dysplasia   3. Gastroesophageal reflux disease without esophagitis   4. Current use of proton pump inhibitor   5. Uncontrolled hypertension    # Lab results was reviewed with patient. Hemoglobin is stable, ferritin level decreased from 3 months ago, still within normal range.  High TIBC. He may still have some functional iron deficiency.  Continue Vitron C.   # Source of blood loss, recent endoscopy last year negative for malignancy and active bleeding. Chronic barrett's esophagitis, Encourage patient to continue follow up with gastroenterologist at [facility] clinic for small bowel capsule study.  # Chronic leukocytosis with neutrophilia and monocytosis. Flowcytometry negative, BCR-Abl negative, negative Jak 2 mutation with reflex.   # Family history of colon Curry: refer to genetic counselor for genetic testing.  # Uncontrolled HTN: asymptomatic. Currently on Norvasc and coreg. Advise patient to follow up with PCP to optimize BP.   # Will repeat CBC CMP iron TIBC ferratin 3 months, and patient can follow up with me for assessment in 6 months.  All questions were answered. The patient knows to call the clinic with any problems, questions or concerns.   Earlie Server, MD, PhD Hematology Oncology [facility] Pager- [phone] 03/02/2018

## 2018-03-04 ENCOUNTER — Inpatient Hospital Stay: Payer: 59 | Admitting: Oncology

## 2018-03-22 ENCOUNTER — Encounter: Payer: Self-pay | Admitting: Family Medicine

## 2018-03-22 DIAGNOSIS — H1089 Other conjunctivitis: Secondary | ICD-10-CM | POA: Diagnosis not present

## 2018-03-22 DIAGNOSIS — H52223 Regular astigmatism, bilateral: Secondary | ICD-10-CM | POA: Diagnosis not present

## 2018-03-22 DIAGNOSIS — I10 Essential (primary) hypertension: Secondary | ICD-10-CM | POA: Diagnosis not present

## 2018-04-30 ENCOUNTER — Encounter: Payer: Self-pay | Admitting: Family Medicine

## 2018-06-01 ENCOUNTER — Inpatient Hospital Stay: Payer: 59

## 2018-06-02 ENCOUNTER — Inpatient Hospital Stay: Payer: 59 | Attending: Oncology

## 2018-06-02 ENCOUNTER — Other Ambulatory Visit: Payer: Self-pay | Admitting: *Deleted

## 2018-06-02 DIAGNOSIS — D500 Iron deficiency anemia secondary to blood loss (chronic): Secondary | ICD-10-CM | POA: Diagnosis not present

## 2018-06-02 LAB — CBC WITH DIFFERENTIAL/PLATELET
BASOS ABS: 0.1 10*3/uL (ref 0–0.1)
BASOS PCT: 1 %
Eosinophils Absolute: 0.4 10*3/uL (ref 0–0.7)
Eosinophils Relative: 3 %
HEMATOCRIT: 45.4 % (ref 40.0–52.0)
Hemoglobin: 15.3 g/dL (ref 13.0–18.0)
LYMPHS PCT: 26 %
Lymphs Abs: 2.9 10*3/uL (ref 1.0–3.6)
MCH: 28.8 pg (ref 26.0–34.0)
MCHC: 33.6 g/dL (ref 32.0–36.0)
MCV: 85.6 fL (ref 80.0–100.0)
MONO ABS: 0.7 10*3/uL (ref 0.2–1.0)
Monocytes Relative: 7 %
NEUTROS ABS: 6.9 10*3/uL — AB (ref 1.4–6.5)
Neutrophils Relative %: 63 %
PLATELETS: 304 10*3/uL (ref 150–440)
RBC: 5.31 MIL/uL (ref 4.40–5.90)
RDW: 13.1 % (ref 11.5–14.5)
WBC: 11 10*3/uL — AB (ref 3.8–10.6)

## 2018-06-02 LAB — IRON AND TIBC
Iron: 60 ug/dL (ref 45–182)
Saturation Ratios: 15 % — ABNORMAL LOW (ref 17.9–39.5)
TIBC: 409 ug/dL (ref 250–450)
UIBC: 349 ug/dL

## 2018-06-02 LAB — FERRITIN: Ferritin: 39 ng/mL (ref 24–336)

## 2018-06-07 ENCOUNTER — Telehealth: Payer: Self-pay | Admitting: Family Medicine

## 2018-06-07 NOTE — Telephone Encounter (Signed)
Last OV 10/28/17 last filled 10/28/17 90 1rf

## 2018-06-07 NOTE — Telephone Encounter (Signed)
Refill sent to pharmacy.  Please contact the patient to get him set up for follow-up.  Thanks.

## 2018-06-08 NOTE — Telephone Encounter (Signed)
Sent mychart message

## 2018-06-09 NOTE — Telephone Encounter (Signed)
Please message the patient with an appointment time. I am happy to see him at 4:30 pm on a Monday, Wednesday, or Friday. Thanks.

## 2018-06-09 NOTE — Telephone Encounter (Signed)
scheduled

## 2018-06-16 ENCOUNTER — Encounter: Payer: Self-pay | Admitting: Family Medicine

## 2018-06-16 ENCOUNTER — Ambulatory Visit: Payer: 59 | Admitting: Family Medicine

## 2018-06-16 VITALS — BP 158/102 | HR 71 | Temp 99.0°F | Ht 68.0 in | Wt 342.8 lb

## 2018-06-16 DIAGNOSIS — Z566 Other physical and mental strain related to work: Secondary | ICD-10-CM

## 2018-06-16 DIAGNOSIS — G44229 Chronic tension-type headache, not intractable: Secondary | ICD-10-CM | POA: Diagnosis not present

## 2018-06-16 DIAGNOSIS — Z9889 Other specified postprocedural states: Secondary | ICD-10-CM | POA: Diagnosis not present

## 2018-06-16 DIAGNOSIS — Z8739 Personal history of other diseases of the musculoskeletal system and connective tissue: Secondary | ICD-10-CM

## 2018-06-16 DIAGNOSIS — I10 Essential (primary) hypertension: Secondary | ICD-10-CM | POA: Diagnosis not present

## 2018-06-16 MED ORDER — CARVEDILOL 12.5 MG PO TABS: 12.5000 mg | ORAL_TABLET | Freq: Two times a day (BID) | ORAL | 1 refills | Status: DC

## 2018-06-16 MED ORDER — SERTRALINE HCL 100 MG PO TABS: 150.0000 mg | ORAL_TABLET | Freq: Every day | ORAL | 1 refills | Status: DC

## 2018-06-16 NOTE — Patient Instructions (Signed)
Nice to see you. We will increase your carvedilol.  Please monitor your blood pressure and send [place] a message in about 2 weeks with your readings. We will increase your Zoloft.  If you develop any anxiety or depression please let us know. We will check lab work today and contact you with the results. Please work on diet and exercise.

## 2018-06-16 NOTE — Progress Notes (Signed)
Tommi Rumps, MD Phone: [phone]  Nathaniel Curry is a 37 y.o. male who presents today for f/u.  CC: htn, gout, obesity,   HYPERTENSION  Disease Monitoring  Home BP Monitoring similar to today Chest pain- no    Dyspnea- no Medications  Compliance-  Taking amlodipine, coreg, indapamide.  Edema- no  Gout: Patient notes he has had 2 gout attacks recently.  Most recently this eased off.  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  He has been treating with ibuprofen.  In the past he has been on Indocin, colchicine, and allopurinol.  He does note some generalized headaches that have been similar to his prior headaches.  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  He does have quite a bit of stress at work.  He has been snapping at times.  He notes no anxiety or depression.  No SI.  Obesity: He does not really have time to exercise right now given his work schedule.  Diet is not great either relating to work schedule.    Social History   Tobacco Use  Smoking Status Never Smoker  Smokeless Tobacco Never Used     ROS see history of present illness  Objective  Physical Exam Vitals:   06/16/18 1640  BP: (!) 158/102  Pulse: 71  Temp: 99 F (37.2 C)  SpO2: 97%    BP Readings from Last 3 Encounters:  06/16/18 (!) 158/102  03/02/18 (!) 168/106  11/27/17 (!) 148/95   Wt Readings from Last 3 Encounters:  06/16/18 (!) 342 lb 12.8 oz (155.5 kg)  11/27/17 (!) 335 lb (152 kg)  10/28/17 (!) 336 lb 3.2 oz (152.5 kg)    Physical Exam  Constitutional: No distress.  Cardiovascular: Normal rate, regular rhythm and normal heart sounds.  Pulmonary/Chest: Effort normal and breath sounds normal.  Musculoskeletal: He exhibits no edema.  Left first MTP with no swelling, warmth, or tenderness  Neurological: He is alert.  Skin: Skin is warm and dry. He is not diaphoretic.     Assessment/Plan: Please see individual problem list.  Essential (primary) hypertension Not at  goal.  We will increase his carvedilol.  Monitor for fatigue with this increase.  He will send [place] a message in 2 weeks with his blood pressures.  He will continue his other medications.  History of gout Patient with recent gout attacks.  Will check uric acid and consider placing him back on allopurinol.  Morbid obesity ([place]) I encouraged diet and exercise as he is able to given his work schedule.  Chronic headaches I suspect this is a mixture of tension headaches as well as related to his blood pressure.  We will treat his blood pressure.  We will increase his Zoloft for the tension aspect and stress aspect.  He will monitor his headaches.  Stress at work Related to work.  Discussed increasing his Zoloft to see if that would provide more benefit.   Orders Placed This Encounter  Procedures  . Comp Met (CMET)  . HgB A1c  . Uric acid    Meds ordered this encounter  Medications  . sertraline (ZOLOFT) 100 MG tablet    Sig: Take 1.5 tablets (150 mg total) by mouth daily.    Dispense:  135 tablet    Refill:  1  . carvedilol (COREG) 12.5 MG tablet    Sig: Take 1 tablet (12.5 mg total) by mouth 2 (two) times daily with a meal.    Dispense:  180  tablet    Refill:  1     Tommi Rumps, MD [place]

## 2018-06-17 ENCOUNTER — Encounter: Payer: Self-pay | Admitting: Family Medicine

## 2018-06-17 DIAGNOSIS — R519 Headache, unspecified: Secondary | ICD-10-CM | POA: Insufficient documentation

## 2018-06-17 DIAGNOSIS — F419 Anxiety disorder, unspecified: Secondary | ICD-10-CM | POA: Insufficient documentation

## 2018-06-17 DIAGNOSIS — F329 Major depressive disorder, single episode, unspecified: Secondary | ICD-10-CM | POA: Insufficient documentation

## 2018-06-17 DIAGNOSIS — F32A Depression, unspecified: Secondary | ICD-10-CM | POA: Insufficient documentation

## 2018-06-17 DIAGNOSIS — M10079 Idiopathic gout, unspecified ankle and foot: Secondary | ICD-10-CM

## 2018-06-17 DIAGNOSIS — G8929 Other chronic pain: Secondary | ICD-10-CM | POA: Insufficient documentation

## 2018-06-17 DIAGNOSIS — R51 Headache: Secondary | ICD-10-CM

## 2018-06-17 LAB — COMPREHENSIVE METABOLIC PANEL
ALK PHOS: 54 U/L (ref 39–117)
ALT: 17 U/L (ref 0–53)
AST: 18 U/L (ref 0–37)
Albumin: 4.5 g/dL (ref 3.5–5.2)
BILIRUBIN TOTAL: 0.8 mg/dL (ref 0.2–1.2)
BUN: 17 mg/dL (ref 6–23)
CO2: 28 mEq/L (ref 19–32)
CREATININE: 1.13 mg/dL (ref 0.40–1.50)
Calcium: 10.2 mg/dL (ref 8.4–10.5)
Chloride: 101 mEq/L (ref 96–112)
GFR: 77.94 mL/min (ref >60.00)
GLUCOSE: 92 mg/dL (ref 70–99)
Potassium: 4.1 mEq/L (ref 3.5–5.1)
SODIUM: 138 meq/L (ref 135–145)
Total Protein: 8.1 g/dL (ref 6.0–8.3)

## 2018-06-17 LAB — URIC ACID: Uric Acid, Serum: 8.9 mg/dL — ABNORMAL HIGH (ref 4.0–7.8)

## 2018-06-17 LAB — HEMOGLOBIN A1C: Hgb A1c MFr Bld: 5.6 % (ref 4.6–6.5)

## 2018-06-17 NOTE — Assessment & Plan Note (Signed)
Patient with recent gout attacks.  Will check uric acid and consider placing him back on allopurinol.

## 2018-06-17 NOTE — Assessment & Plan Note (Signed)
Not at goal.  We will increase his carvedilol.  Monitor for fatigue with this increase.  He will send [place] a message in 2 weeks with his blood pressures.  He will continue his other medications.

## 2018-06-17 NOTE — Assessment & Plan Note (Signed)
Related to work.  Discussed increasing his Zoloft to see if that would provide more benefit.

## 2018-06-17 NOTE — Assessment & Plan Note (Signed)
I encouraged diet and exercise as he is able to given his work schedule.

## 2018-06-17 NOTE — Assessment & Plan Note (Signed)
I suspect this is a mixture of tension headaches as well as related to his blood pressure.  We will treat his blood pressure.  We will increase his Zoloft for the tension aspect and stress aspect.  He will monitor his headaches.

## 2018-06-18 MED ORDER — COLCHICINE 0.6 MG PO TABS: 0.6000 mg | ORAL_TABLET | Freq: Every day | ORAL | 2 refills | Status: DC

## 2018-06-18 MED ORDER — ALLOPURINOL 100 MG PO TABS: 100.0000 mg | ORAL_TABLET | Freq: Every day | ORAL | 6 refills | Status: DC

## 2018-07-29 ENCOUNTER — Encounter: Payer: Self-pay | Admitting: Family Medicine

## 2018-07-29 DIAGNOSIS — I10 Essential (primary) hypertension: Secondary | ICD-10-CM

## 2018-08-03 MED ORDER — AMLODIPINE-OLMESARTAN 10-20 MG PO TABS: 1.0000 | ORAL_TABLET | Freq: Every day | ORAL | 3 refills | Status: DC

## 2018-08-04 MED ORDER — BUSPIRONE HCL 7.5 MG PO TABS: 7.5000 mg | ORAL_TABLET | Freq: Two times a day (BID) | ORAL | 3 refills | Status: DC

## 2018-08-27 ENCOUNTER — Other Ambulatory Visit: Payer: Self-pay

## 2018-08-27 DIAGNOSIS — D500 Iron deficiency anemia secondary to blood loss (chronic): Secondary | ICD-10-CM

## 2018-08-31 ENCOUNTER — Other Ambulatory Visit: Payer: Self-pay | Admitting: *Deleted

## 2018-08-31 DIAGNOSIS — D500 Iron deficiency anemia secondary to blood loss (chronic): Secondary | ICD-10-CM

## 2018-09-01 ENCOUNTER — Inpatient Hospital Stay: Payer: 59 | Attending: Oncology

## 2018-09-01 DIAGNOSIS — D72829 Elevated white blood cell count, unspecified: Secondary | ICD-10-CM | POA: Diagnosis not present

## 2018-09-01 DIAGNOSIS — D500 Iron deficiency anemia secondary to blood loss (chronic): Secondary | ICD-10-CM | POA: Diagnosis not present

## 2018-09-01 DIAGNOSIS — Z800 Family history of malignant neoplasm of digestive organs: Secondary | ICD-10-CM | POA: Insufficient documentation

## 2018-09-01 DIAGNOSIS — Z79899 Other long term (current) drug therapy: Secondary | ICD-10-CM | POA: Diagnosis not present

## 2018-09-01 DIAGNOSIS — I10 Essential (primary) hypertension: Secondary | ICD-10-CM | POA: Insufficient documentation

## 2018-09-01 DIAGNOSIS — K2270 Barrett's esophagus without dysplasia: Secondary | ICD-10-CM | POA: Diagnosis not present

## 2018-09-01 LAB — CBC WITH DIFFERENTIAL/PLATELET
Abs Immature Granulocytes: 0.04 10*3/uL (ref 0.00–0.07)
Basophils Absolute: 0.1 10*3/uL (ref 0.0–0.1)
Basophils Relative: 0 %
EOS ABS: 0.5 10*3/uL (ref 0.0–0.5)
Eosinophils Relative: 4 %
HEMATOCRIT: 46.8 % (ref 39.0–52.0)
HEMOGLOBIN: 15.7 g/dL (ref 13.0–17.0)
IMMATURE GRANULOCYTES: 0 %
LYMPHS ABS: 3 10*3/uL (ref 0.7–4.0)
Lymphocytes Relative: 26 %
MCH: 28.6 pg (ref 26.0–34.0)
MCHC: 33.5 g/dL (ref 30.0–36.0)
MCV: 85.4 fL (ref 80.0–100.0)
MONOS PCT: 7 %
Monocytes Absolute: 0.8 10*3/uL (ref 0.1–1.0)
Neutro Abs: 7.1 10*3/uL (ref 1.7–7.7)
Neutrophils Relative %: 63 %
PLATELETS: 363 10*3/uL (ref 150–400)
RBC: 5.48 MIL/uL (ref 4.22–5.81)
RDW: 13.1 % (ref 11.5–15.5)
WBC: 11.4 10*3/uL — ABNORMAL HIGH (ref 4.0–10.5)
nRBC: 0 % (ref 0.0–0.2)

## 2018-09-01 LAB — BASIC METABOLIC PANEL
ANION GAP: 9 (ref 5–15)
BUN: 11 mg/dL (ref 6–20)
CO2: 25 mmol/L (ref 22–32)
Calcium: 9.4 mg/dL (ref 8.9–10.3)
Chloride: 102 mmol/L (ref 98–111)
Creatinine, Ser: 0.97 mg/dL (ref 0.61–1.24)
GFR calc Af Amer: >60 mL/min (ref >60)
Glucose, Bld: 116 mg/dL — ABNORMAL HIGH (ref 70–99)
POTASSIUM: 3.7 mmol/L (ref 3.5–5.1)
SODIUM: 136 mmol/L (ref 135–145)

## 2018-09-01 LAB — FERRITIN: Ferritin: 63 ng/mL (ref 24–336)

## 2018-09-01 LAB — IRON AND TIBC
IRON: 84 ug/dL (ref 45–182)
SATURATION RATIOS: 20 % (ref 17.9–39.5)
TIBC: 430 ug/dL (ref 250–450)
UIBC: 346 ug/dL

## 2018-09-01 LAB — URIC ACID: URIC ACID, SERUM: 7.5 mg/dL (ref 3.7–8.6)

## 2018-09-02 ENCOUNTER — Inpatient Hospital Stay: Payer: 59

## 2018-09-02 ENCOUNTER — Other Ambulatory Visit: Payer: 59

## 2018-09-03 ENCOUNTER — Inpatient Hospital Stay (HOSPITAL_BASED_OUTPATIENT_CLINIC_OR_DEPARTMENT_OTHER): Payer: 59 | Admitting: Oncology

## 2018-09-03 ENCOUNTER — Other Ambulatory Visit: Payer: Self-pay

## 2018-09-03 ENCOUNTER — Encounter: Payer: Self-pay | Admitting: Oncology

## 2018-09-03 VITALS — BP 130/81 | HR 61 | Temp 97.9°F | Resp 18

## 2018-09-03 DIAGNOSIS — Z800 Family history of malignant neoplasm of digestive organs: Secondary | ICD-10-CM | POA: Diagnosis not present

## 2018-09-03 DIAGNOSIS — K2270 Barrett's esophagus without dysplasia: Secondary | ICD-10-CM | POA: Diagnosis not present

## 2018-09-03 DIAGNOSIS — Z79899 Other long term (current) drug therapy: Secondary | ICD-10-CM | POA: Diagnosis not present

## 2018-09-03 DIAGNOSIS — D72829 Elevated white blood cell count, unspecified: Secondary | ICD-10-CM | POA: Diagnosis not present

## 2018-09-03 DIAGNOSIS — D500 Iron deficiency anemia secondary to blood loss (chronic): Secondary | ICD-10-CM | POA: Diagnosis not present

## 2018-09-03 DIAGNOSIS — I10 Essential (primary) hypertension: Secondary | ICD-10-CM | POA: Diagnosis not present

## 2018-09-03 NOTE — Progress Notes (Signed)
Patient here for follow up

## 2018-09-03 NOTE — Progress Notes (Signed)
[name] Follow up Visit   Patient Care Team: Leone Haven, MD as PCP - General (Family Medicine)  REASON FOR VISIT Follow up for treatment of iron deficiency anemia.   HISTORY OF PRESENTING ILLNESS: Nathaniel Curry 37 y.o. male with PMH presents for follow up. Pertinent HemOnc questions listed below.  # Iron deficiency anemia: s/p Feraheme x 4 doses. Ferritin improves and decreases, indicating ongoing blood loss.  Celiac panel normal  # Barrett's esophagitis and his recent GI work up listed as below:  EGD and Colonoscopy done in 2017 which revealed Barrett Esophagitis (with stigmata of bleeding/friability was found), and diverticulosis sigmoid and descending colon. 03/13/2016 Capsule study  10/19 2018 upper endoscopy in  which showed esophageal mucosal changes secondary to established short-segment Barrett's disease. Biopsied.- Gastritis and intact sleeve gastrectomy. Normal exam of duodenum Biopsy pathology showed gastroesophagitis with ulceration, negative for fungus, negative for goblet cells, dysplasia and malignancy.   We discussed about his family history. Mother had colon cancer. His father possibly was recently diagnosed with colon cancer.  He has significant colon family history and he is concerned of genetic syndrome.  INTERVAL HISTORY Nathaniel Curry is a 37 y.o. male who has above history reviewed by me today presents for follow up visit for management of iron deficiency anemia.  Patient feels well. No new complaints. Denies any fatigue, abdominal discomfort.  He takes over the counter Vitron C daily.      Review of Systems  Constitutional: Negative for appetite change, chills, diaphoresis, fatigue, fever and unexpected weight change.  HENT:   Negative for hearing loss, lump/mass, nosebleeds and sore throat.   Eyes: Negative for eye problems and icterus.  Respiratory: Negative for chest tightness, cough, hemoptysis, shortness of breath and wheezing.    Cardiovascular: Negative for chest pain and leg swelling.  Gastrointestinal: Negative for abdominal distention, abdominal pain, blood in stool, diarrhea, nausea and rectal pain.  Endocrine: Negative for hot flashes.  Genitourinary: Negative for bladder incontinence, difficulty urinating, dysuria, frequency, hematuria and nocturia.        Chronic acid reflux  Musculoskeletal: Negative for arthralgias, back pain, flank pain, gait problem and myalgias.  Skin: Negative for itching and rash.  Neurological: Negative for dizziness, gait problem, headaches, numbness and seizures.  Hematological: Negative for adenopathy. Does not bruise/bleed easily.  Psychiatric/Behavioral: Negative for confusion and decreased concentration. The patient is not nervous/anxious.     MEDICAL HISTORY: Past Medical History:  Diagnosis Date  . Allergy   . Anemia   . Barrett's esophagus   . GERD (gastroesophageal reflux disease)   . Gout   . Gout   . History of kidney stones   . Hypertension   . Iron deficiency anemia 05/28/2017  . Nephrolithiasis 02/08/2014  . Obesity     SURGICAL HISTORY: Past Surgical History:  Procedure Laterality Date  . BARIATRIC SURGERY     Gastric sleeve  . CERVICAL FUSION    . COLONOSCOPY WITH PROPOFOL N/A 01/11/2016   Procedure: COLONOSCOPY WITH PROPOFOL;  Surgeon: Lollie Sails, MD;  Location: [facility] ENDOSCOPY;  Service: Endoscopy;  Laterality: N/A;  . ESOPHAGOGASTRODUODENOSCOPY  01/11/2016   Procedure: ESOPHAGOGASTRODUODENOSCOPY (EGD);  Surgeon: Lollie Sails, MD;  Location: [facility] ENDOSCOPY;  Service: Endoscopy;;  . ESOPHAGOGASTRODUODENOSCOPY (EGD) WITH PROPOFOL N/A 08/07/2017   Procedure: ESOPHAGOGASTRODUODENOSCOPY (EGD) WITH PROPOFOL;  Surgeon: Toledo, Benay Pike, MD;  Location: [facility] ENDOSCOPY;  Service: Gastroenterology;  Laterality: N/A;  . LASIK    . TONSILLECTOMY N/A  08/06/2016   Procedure: TONSILLECTOMY;  Surgeon: Clyde Canterbury, MD;  Location: [facility] ORS;  Service: ENT;   Laterality: N/A;  . UPPER GASTROINTESTINAL ENDOSCOPY      SOCIAL HISTORY: Social History   Socioeconomic History  . Marital status: Single    Spouse name: Not on file  . Number of children: Not on file  . Years of education: Not on file  . Highest education level: Not on file  Occupational History    Employer: [employer]  Social Needs  . Financial resource strain: Not on file  . Food insecurity:    Worry: Not on file    Inability: Not on file  . Transportation needs:    Medical: Not on file    Non-medical: Not on file  Tobacco Use  . Smoking status: Never Smoker  . Smokeless tobacco: Never Used  Substance and Sexual Activity  . Alcohol use: No    Alcohol/week: 0.0 standard drinks  . Drug use: No  . Sexual activity: Never  Lifestyle  . Physical activity:    Days per week: Not on file    Minutes per session: Not on file  . Stress: Not on file  Relationships  . Social connections:    Talks on phone: Not on file    Gets together: Not on file    Attends religious service: Not on file    Active member of club or organization: Not on file    Attends meetings of clubs or organizations: Not on file    Relationship status: Not on file  . Intimate partner violence:    Fear of current or ex partner: Not on file    Emotionally abused: Not on file    Physically abused: Not on file    Forced sexual activity: Not on file  Other Topics Concern  . Not on file  Social History Narrative  . Not on file    FAMILY HISTORY Family History  Problem Relation Age of Onset  . Alcohol abuse Mother   . Cancer Mother        colon  . Hypertension Mother   . Hyperlipidemia Father   . Hypertension Father   . Diabetes Father   . Hypertension Maternal Grandmother   . Alcohol abuse Maternal Grandmother   . Hypertension Maternal Grandfather   . Alcohol abuse Maternal Grandfather   . Hypertension Paternal Grandmother   . Hypertension Paternal Grandfather      ALLERGIES:  has No Known Allergies.  MEDICATIONS:  Current Outpatient Medications  Medication Sig Dispense Refill  . allopurinol (ZYLOPRIM) 100 MG tablet Take 1 tablet (100 mg total) by mouth daily. 30 tablet 6  . amlodipine-olmesartan (AZOR) 10-20 MG tablet Take 1 tablet by mouth daily. 30 tablet 3  . busPIRone (BUSPAR) 7.5 MG tablet Take 1 tablet (7.5 mg total) by mouth 2 (two) times daily. 60 tablet 3  . carvedilol (COREG) 12.5 MG tablet Take 1 tablet (12.5 mg total) by mouth 2 (two) times daily with a meal. 180 tablet 1  . colchicine 0.6 MG tablet Take 1 tablet (0.6 mg total) by mouth daily. 30 tablet 2  . indapamide (LOZOL) 2.5 MG tablet TAKE 1 TABLET (2.5 MG TOTAL) BY MOUTH DAILY. 90 tablet 0  . Iron-Vitamin C (VITRON-C) 65-125 MG TABS Take by mouth.    . pantoprazole (PROTONIX) 40 MG tablet Take 40 mg by mouth 2 (two) times daily.     . sertraline (ZOLOFT) 100 MG tablet Take 1.5  tablets (150 mg total) by mouth daily. 135 tablet 1  . potassium chloride (K-DUR) 10 MEQ tablet Take 1 tablet (10 mEq total) by mouth daily. (Patient not taking: Reported on 06/16/2018) 30 tablet 0   No current facility-administered medications for this visit.     PHYSICAL EXAMINATION: ECOG PERFORMANCE STATUS: 0 - Asymptomatic   Vitals:   09/03/18 1530  BP: 130/81  Pulse: 61  Resp: 18  Temp: 97.9 F (36.6 C)    Filed Weights     Physical Exam  Constitutional: He is oriented to person, place, and time and well-developed, well-nourished, and in no distress. No distress.  Obese.  HENT:  Head: Normocephalic and atraumatic.  Mouth/Throat: No oropharyngeal exudate.  Eyes: Pupils are equal, round, and reactive to light. Conjunctivae and EOM are normal. No scleral icterus.  Neck: Normal range of motion. Neck supple.  Cardiovascular: Normal rate, regular rhythm and normal heart sounds.  No murmur heard. Pulmonary/Chest: Effort normal and breath sounds normal. No respiratory distress. He has no  wheezes.  Abdominal: Soft. Bowel sounds are normal. He exhibits no distension.  Musculoskeletal: Normal range of motion. He exhibits no edema.  Neurological: He is alert and oriented to person, place, and time. No cranial nerve deficit.  Skin: Skin is warm and dry. He is not diaphoretic.  Psychiatric: Affect and judgment normal.      LABORATORY DATA: I have personally reviewed the data as listed:  Appointment on 09/01/2018  Component Date Value Ref Range Status  . Uric Acid, Serum 09/01/2018 7.5  3.7 - 8.6 mg/dL Final   Performed at [facility], [address], [place], [place] [address]  . Sodium 09/01/2018 136  135 - 145 mmol/L Final  . Potassium 09/01/2018 3.7  3.5 - 5.1 mmol/L Final  . Chloride 09/01/2018 102  98 - 111 mmol/L Final  . CO2 09/01/2018 25  22 - 32 mmol/L Final  . Glucose, Bld 09/01/2018 116* 70 - 99 mg/dL Final  . BUN 09/01/2018 11  6 - 20 mg/dL Final  . Creatinine, Ser 09/01/2018 0.97  0.61 - 1.24 mg/dL Final  . Calcium 09/01/2018 9.4  8.9 - 10.3 mg/dL Final  . GFR calc non Af Amer 09/01/2018 >60  >60 mL/min Final  . GFR calc Af Amer 09/01/2018 >60  >60 mL/min Final   Comment: (NOTE) The eGFR has been calculated using the CKD EPI equation. This calculation has not been validated in all clinical situations. eGFR's persistently <60 mL/min signify possible Chronic Kidney Disease.   Georgiann Hahn gap 09/01/2018 9  5 - 15 Final   Performed at [facility], [address] [place], [place] [address]  . Iron 09/01/2018 84  45 - 182 ug/dL Final  . TIBC 09/01/2018 430  250 - 450 ug/dL Final  . Saturation Ratios 09/01/2018 20  17.9 - 39.5 % Final  . UIBC 09/01/2018 346  ug/dL Final   Performed at [facility], [address], [place], [place] [address]  . Ferritin 09/01/2018 63  24 - 336 ng/mL Final   Performed at [facility], [address] [place], [place] [address]  . WBC 09/01/2018 11.4* 4.0 - 10.5 K/uL Final  . RBC  09/01/2018 5.48  4.22 - 5.81 MIL/uL Final  . Hemoglobin 09/01/2018 15.7  13.0 - 17.0 g/dL Final  . HCT 09/01/2018 46.8  39.0 - 52.0 % Final  . MCV 09/01/2018 85.4  80.0 - 100.0 fL Final  . [facility] 09/01/2018  28.6  26.0 - 34.0 pg Final  . MCHC 09/01/2018 33.5  30.0 - 36.0 g/dL Final  . RDW 09/01/2018 13.1  11.5 - 15.5 % Final  . Platelets 09/01/2018 363  150 - 400 K/uL Final  . nRBC 09/01/2018 0.0  0.0 - 0.2 % Final  . Neutrophils Relative % 09/01/2018 63  % Final  . Neutro Abs 09/01/2018 7.1  1.7 - 7.7 K/uL Final  . Lymphocytes Relative 09/01/2018 26  % Final  . Lymphs Abs 09/01/2018 3.0  0.7 - 4.0 K/uL Final  . Monocytes Relative 09/01/2018 7  % Final  . Monocytes Absolute 09/01/2018 0.8  0.1 - 1.0 K/uL Final  . Eosinophils Relative 09/01/2018 4  % Final  . Eosinophils Absolute 09/01/2018 0.5  0.0 - 0.5 K/uL Final  . Basophils Relative 09/01/2018 0  % Final  . Basophils Absolute 09/01/2018 0.1  0.0 - 0.1 K/uL Final  . Immature Granulocytes 09/01/2018 0  % Final  . Abs Immature Granulocytes 09/01/2018 0.04  0.00 - 0.07 K/uL Final   Performed at [facility], [address], [place], [place] [address]   # UA negative for hematuria. ASSESSMENT/PLAN 1. Iron deficiency anemia due to chronic blood loss   2. Barrett's esophagus without dysplasia    # Lab results reviewed and discussed with patient.  Iron store and hemoglobin stable.  Continue Vitron C.   Chronic leukocytosis unchanged. Flowcytometry negative, BCR-Abl negative, negative Jak 2 mutation with reflex Recommend patient to follow up with primary care physician. Follow up with me as needed.   All questions were answered. The patient knows to call the clinic with any problems, questions or concerns.   Earlie Server, MD, PhD Hematology Oncology [facility] at [facility] Pager- [phone] 09/03/2018

## 2018-09-08 ENCOUNTER — Encounter: Payer: Self-pay | Admitting: Family Medicine

## 2018-09-08 ENCOUNTER — Ambulatory Visit
Admission: RE | Admit: 2018-09-08 | Discharge: 2018-09-08 | Disposition: A | Discharge status: Home / Self Care | Payer: 59 | Source: Ambulatory Visit | Attending: Family Medicine | Admitting: Family Medicine

## 2018-09-08 DIAGNOSIS — M79672 Pain in left foot: Secondary | ICD-10-CM

## 2018-09-08 DIAGNOSIS — R600 Localized edema: Secondary | ICD-10-CM | POA: Diagnosis not present

## 2018-09-09 MED ORDER — PREDNISONE 20 MG PO TABS: 40.0000 mg | ORAL_TABLET | Freq: Every day | ORAL | 0 refills | Status: DC

## 2018-09-28 ENCOUNTER — Ambulatory Visit: Payer: 59 | Admitting: Family Medicine

## 2018-09-28 ENCOUNTER — Encounter: Payer: Self-pay | Admitting: Family Medicine

## 2018-09-28 DIAGNOSIS — F419 Anxiety disorder, unspecified: Secondary | ICD-10-CM

## 2018-09-28 DIAGNOSIS — D509 Iron deficiency anemia, unspecified: Secondary | ICD-10-CM | POA: Diagnosis not present

## 2018-09-28 DIAGNOSIS — M79672 Pain in left foot: Secondary | ICD-10-CM

## 2018-09-28 DIAGNOSIS — I10 Essential (primary) hypertension: Secondary | ICD-10-CM

## 2018-09-28 NOTE — Assessment & Plan Note (Signed)
Much improved.  Suspect inflammation as cause.  He will monitor for worsening.  Continue icing and stretching.

## 2018-09-28 NOTE — Patient Instructions (Signed)
Nice to see you. Please monitor your left heel.  If the pain returns please let us know.  Please continue to ice it and stretch. Please continue to monitor your blood pressure periodically. We will transition you to Lexapro.  I will send you a MyChart message with directions.

## 2018-09-28 NOTE — Assessment & Plan Note (Signed)
Counts have been stable on oral supplementation.  He will continue to follow with hematology.

## 2018-09-28 NOTE — Progress Notes (Signed)
  Tommi Rumps, MD Phone: [phone]  Nathaniel Curry is a 37 y.o. male who presents today for follow-up.  CC: Left foot pain, hypertension, iron deficiency anemia, anxiety  Left heel pain: Patient notes this has improved.  He had an x-ray that revealed enthesopathic changes at the Achilles insertion site.  We treated him with a course of prednisone and this has improved.  Mild tenderness to touch though he is able to walk well now.  Hypertension: He has been running in the 100s/60s-70s at home.  He denies chest pain, shortness of breath, and edema.  He is taking amlodipine, olmesartan, carvedilol, and indapamide.  He had a BMP through oncology recently.  He reports he had work-up for secondary hypertension many years ago.  Iron deficiency anemia: His counts have been stable.  He has not required any infusions recently.  He continues on oral supplementation.  He follows up with GI early next year.  He has had an endoscopy, colonoscopy, and capsule study previously.  Anxiety: Patient continues to have issues with this.  He notes it is related to stress from work.  He gets very irritable.  Zoloft and BuSpar have not been beneficial.  He denies depression.  Social History   Tobacco Use  Smoking Status Never Smoker  Smokeless Tobacco Never Used     ROS see history of present illness  Objective  Physical Exam Vitals:   09/28/18 1408  BP: 130/88  Pulse: (!) 59  Temp: 98.8 F (37.1 C)  SpO2: 95%    BP Readings from Last 3 Encounters:  09/28/18 130/88  09/03/18 130/81  06/16/18 (!) 158/102   Wt Readings from Last 3 Encounters:  09/28/18 (!) 341 lb 12.8 oz (155 kg)  06/16/18 (!) 342 lb 12.8 oz (155.5 kg)  11/27/17 (!) 335 lb (152 kg)    Physical Exam  Constitutional: No distress.  Cardiovascular: Normal rate, regular rhythm and normal heart sounds.  Pulmonary/Chest: Effort normal and breath sounds normal.  Musculoskeletal: He exhibits no edema.  Left heel with minimal  tenderness right at the insertion site of the Achilles tendon, no other Achilles tendon tenderness, no swelling  Neurological: He is alert.  Skin: Skin is warm and dry. He is not diaphoretic.     Assessment/Plan: Please see individual problem list.  Essential (primary) hypertension Now at goal.  He will continue his current regimen.  Iron deficiency anemia Counts have been stable on oral supplementation.  He will continue to follow with hematology.  Anxiety We will transition him off of Zoloft on to Lexapro.  We will contact him via my chart with instructions.  Pain of left heel Much improved.  Suspect inflammation as cause.  He will monitor for worsening.  Continue icing and stretching.   No orders of the defined types were placed in this encounter.   No orders of the defined types were placed in this encounter.    Tommi Rumps, MD [place]

## 2018-09-28 NOTE — Assessment & Plan Note (Signed)
Now at goal.  He will continue his current regimen.

## 2018-09-28 NOTE — Assessment & Plan Note (Signed)
We will transition him off of Zoloft on to Lexapro.  We will contact him via my chart with instructions.

## 2018-09-30 MED ORDER — AMLODIPINE-OLMESARTAN 10-20 MG PO TABS: 1.0000 | ORAL_TABLET | Freq: Every day | ORAL | 3 refills | Status: DC

## 2018-09-30 MED ORDER — INDAPAMIDE 2.5 MG PO TABS: 2.5000 mg | ORAL_TABLET | Freq: Every day | ORAL | 1 refills | Status: DC

## 2018-09-30 MED ORDER — ALLOPURINOL 100 MG PO TABS: 100.0000 mg | ORAL_TABLET | Freq: Every day | ORAL | 1 refills | Status: DC

## 2018-09-30 MED ORDER — BUSPIRONE HCL 7.5 MG PO TABS: 7.5000 mg | ORAL_TABLET | Freq: Two times a day (BID) | ORAL | 1 refills | Status: DC

## 2018-09-30 MED ORDER — ESCITALOPRAM OXALATE 10 MG PO TABS: 10.0000 mg | ORAL_TABLET | Freq: Every day | ORAL | 1 refills | Status: DC

## 2018-10-07 ENCOUNTER — Encounter: Payer: Self-pay | Admitting: Genetics

## 2018-10-07 ENCOUNTER — Ambulatory Visit (HOSPITAL_BASED_OUTPATIENT_CLINIC_OR_DEPARTMENT_OTHER): Payer: 59 | Admitting: Genetics

## 2018-10-07 ENCOUNTER — Ambulatory Visit: Payer: Self-pay

## 2018-10-07 DIAGNOSIS — Z8049 Family history of malignant neoplasm of other genital organs: Secondary | ICD-10-CM

## 2018-10-07 DIAGNOSIS — Z808 Family history of malignant neoplasm of other organs or systems: Secondary | ICD-10-CM | POA: Diagnosis not present

## 2018-10-07 DIAGNOSIS — Z803 Family history of malignant neoplasm of breast: Secondary | ICD-10-CM | POA: Diagnosis not present

## 2018-10-07 DIAGNOSIS — Z800 Family history of malignant neoplasm of digestive organs: Secondary | ICD-10-CM | POA: Diagnosis not present

## 2018-10-07 DIAGNOSIS — Z7183 Encounter for nonprocreative genetic counseling: Secondary | ICD-10-CM | POA: Diagnosis not present

## 2018-10-07 HISTORY — DX: Family history of malignant neoplasm of digestive organs: Z80.0

## 2018-10-07 NOTE — Progress Notes (Signed)
REFERRING PROVIDER: Earlie Server, MD [place], [place] [address]  PRIMARY PROVIDER:  Leone Haven, MD  PRIMARY REASON FOR VISIT:  1. Family history of uterine cancer   2. Family history of melanoma   3. Family history of breast cancer   4. Family history of colon cancer     HISTORY OF PRESENT ILLNESS:   Mr. Nathaniel Curry, a 37 y.o. male, was seen for a [facility] cancer genetics consultation at the request of Dr. Tasia Catchings due to a family history of cancer.  Nathaniel Curry presents to clinic today to discuss the possibility of a hereditary predisposition to cancer, genetic testing, and to further clarify his future cancer risks, as well as potential cancer risks for family members.   Nathaniel Curry is a 37 y.o. male with no personal history of cancer.   Colonoscopy: yes; normal.  Past Medical History:  Diagnosis Date  . Allergy   . Anemia   . Barrett's esophagus   . Family history of breast cancer   . Family history of colon cancer 10/07/2018  . Family history of melanoma   . Family history of uterine cancer   . GERD (gastroesophageal reflux disease)   . Gout   . Gout   . History of kidney stones   . Hypertension   . Iron deficiency anemia 05/28/2017  . Nephrolithiasis 02/08/2014  . Obesity     Past Surgical History:  Procedure Laterality Date  . BARIATRIC SURGERY     Gastric sleeve  . CERVICAL FUSION    . COLONOSCOPY WITH PROPOFOL N/A 01/11/2016   Procedure: COLONOSCOPY WITH PROPOFOL;  Surgeon: Lollie Sails, MD;  Location: [facility] ENDOSCOPY;  Service: Endoscopy;  Laterality: N/A;  . ESOPHAGOGASTRODUODENOSCOPY  01/11/2016   Procedure: ESOPHAGOGASTRODUODENOSCOPY (EGD);  Surgeon: Lollie Sails, MD;  Location: [facility] ENDOSCOPY;  Service: Endoscopy;;  . ESOPHAGOGASTRODUODENOSCOPY (EGD) WITH PROPOFOL N/A 08/07/2017   Procedure: ESOPHAGOGASTRODUODENOSCOPY (EGD) WITH PROPOFOL;  Surgeon: Toledo, Benay Pike, MD;  Location: [facility] ENDOSCOPY;  Service: Gastroenterology;  Laterality: N/A;  .  LASIK    . TONSILLECTOMY N/A 08/06/2016   Procedure: TONSILLECTOMY;  Surgeon: Clyde Canterbury, MD;  Location: [facility] ORS;  Service: ENT;  Laterality: N/A;  . UPPER GASTROINTESTINAL ENDOSCOPY      Social History   Socioeconomic History  . Marital status: Single    Spouse name: Not on file  . Number of children: Not on file  . Years of education: Not on file  . Highest education level: Not on file  Occupational History    Employer: [employer]  Social Needs  . Financial resource strain: Not on file  . Food insecurity:    Worry: Not on file    Inability: Not on file  . Transportation needs:    Medical: Not on file    Non-medical: Not on file  Tobacco Use  . Smoking status: Never Smoker  . Smokeless tobacco: Never Used  Substance and Sexual Activity  . Alcohol use: No    Alcohol/week: 0.0 standard drinks  . Drug use: No  . Sexual activity: Never  Lifestyle  . Physical activity:    Days per week: Not on file    Minutes per session: Not on file  . Stress: Not on file  Relationships  . Social connections:    Talks on phone: Not on file    Gets together: Not on file    Attends religious service: Not on file    Active member of  club or organization: Not on file    Attends meetings of clubs or organizations: Not on file    Relationship status: Not on file  Other Topics Concern  . Not on file  Social History Narrative  . Not on file     FAMILY HISTORY:  We obtained a detailed, 4-generation family history.  Significant diagnoses are listed below: Family History  Problem Relation Age of Onset  . Alcohol abuse Mother   . Cancer Mother        unk Lum Keas- was in appendix, peritoneal,, and colon  . Hypertension Mother   . Uterine cancer Mother 70  . Hyperlipidemia Father   . Hypertension Father   . Diabetes Father   . Colon cancer Father 70  . Hypertension Maternal Grandmother   . Alcohol abuse Maternal Grandmother   . Breast cancer Maternal Grandmother         dx >50  . Hypertension Maternal Grandfather   . Alcohol abuse Maternal Grandfather   . Melanoma Maternal Grandfather 63  . Hypertension Paternal Grandmother   . Kidney cancer Paternal Grandmother 7  . Liver cancer Paternal Grandmother   . Hypertension Paternal Grandfather     Nathaniel Curry has no children.  He has a full brother who is 89 with no cancer and no children.  He has a paternal half-brother who is 101.   Nathaniel Curry father: 36, recently dx with colon cancer.  Paternal Aunts/Uncles: 1 paternal aunt in her 89's with no hx of cancer.  Paternal cousins: no hx of cancer Paternal grandfather: died older than 22, no hx of cancer.  Paternal grandmother:deceased. Hx of kidney and liver cancer in her 73's.   Nathaniel Curry mother: died at 44 due to a cancer unk primary- he reports it was in her appendix, peritoneum, colon. She died from this at 66.  She also had a hx of endometiral cacner in ehr 30's.  Maternal Aunts/Uncles: 1 maternal aunt is 50 with no hx of cancer.  She is currently being evaluated [facility] lynch syndrome at [facility]- no hx of cancer.  Maternal cousins: 2 male cousins, no hx of cancer Maternal grandfather: died in his 45's due to metastatic malanoma Maternal grandmother:in her 5's, hx of breast cancer dx >50  Patient's maternal ancestors are of N. European descent, and paternal ancestors are of N. European descent. There is no reported Ashkenazi Jewish ancestry. There is no known consanguinity.  GENETIC COUNSELING ASSESSMENT: Nathaniel Curry is a 37 y.o. male with a family history which is somewhat suggestive of a Hereditary Cancer Predisposition Syndrome. We, therefore, discussed and recommended the following at today's visit.   DISCUSSION: We reviewed the characteristics, features and inheritance patterns of hereditary cancer syndromes. We also discussed genetic testing, including the appropriate family members to test, the process of testing, insurance coverage and  turn-around-time for results. We discussed the implications of a negative, positive and/or variant of uncertain significant result. We recommended Nathaniel Curry pursue genetic testing for the Multi-Cancer gene panel.   The Multi-Cancer Panel offered by [facility] includes sequencing and/or deletion duplication testing of the following 90 genes: AIP, ALK, APC, ATM, AXIN2, BAP1, BARD1, BLM, BMPR1A, BRCA1, BRCA2, BRIP1, BUB1B, CASR, CDC73, CDH1, CDK4, CDKN1B, CDKN1C, CDKN2A, CEBPA, CHEK2, CTNNA1, DICER1, DIS3L2, EGFR, ENG, EPCAM, FH, FLCN, GALNT12, GATA2, GPC3, GREM1, HOXB13, HRAS, KIT, MAX, MEN1, MET, MITF, MLH1, MLH3, MSH2, MSH3, MSH6, MUTYH, NBN, NF1, NF2, NTHL1, PALB2, PDGFRA, PHOX2B, PMS2, POLD1, POLE, POT1, PRKAR1A, PTCH1, PTEN, RAD50, RAD51C, RAD51D,  RB1, RECQL4, RET, RNF43, RPS20, RUNX1, SDHA, SDHAF2, SDHB, SDHC, SDHD, SMAD4, SMARCA4, SMARCB1, SMARCE1, STK11, SUFU, TERC, TERT, TMEM127, TP53, TSC1, TSC2, VHL, WRN, WT1.  We discussed that only 5-10% of cancers are associated with a Hereditary Cancer Predisposition Syndrome.  The most common hereditary cancer syndrome associated with uterine/colon cancer is Lynch Syndrome.  Lynch Syndrome is caused by mutations in the genes: MLH1, MSH2, MSH6, PMS2 and EPCAM.  This syndrome increases the risk for colon, uterine, ovarian and stomach cancers, as well as others.  Families with Lynch Syndrome tend to have multiple family members with these cancers, typically diagnosed under age 81, and diagnoses in multiple generations.    We also dicussed that there are many genes that cause many different types of cancer risks.    We discussed that if he is found to have a mutation in one of these genes, it may impact future medical management recommendations such as increased cancer screenings and consideration of risk reducing surgeries.  A positive result could also have implications for the patient's family members.  A Negative result would mean we did not identify a hereditary  predisposition to cancer in him.  However genetic testing does not rule out the possibility of a hereditary risk for cancer.  There could be mutations that are undetectable by current technology, or in genes not yet tested or identified to increase cancer risk.    We discussed the potential to find a Variant of Uncertain Significance or VUS.  These are variants that have not yet been identified as pathogenic or benign, and it is unknown if this variant is associated with increased cancer risk or if this is a normal finding.  Most VUS's are reclassified to benign or likely benign.   It should not be used to make medical management decisions. With time, we suspect the lab will determine the significance of any VUS's identified if any.   Based on Nathaniel Curry family history of cancer, he meets medical criteria for genetic testing. Despite that he meets criteria, he may still have an out of pocket cost. The laboratory can provide an estimate of his OOP cost.  hospital was provided the contact information for the laboratory if hospital has further questions.   [facility]AN: After considering the risks, benefits, and limitations, Nathaniel Curry  provided informed consent to pursue genetic testing and the blood sample was sent to [facility] for analysis of the Multi-Cancer Panel. Results should be available within approximately 2-3 weeks' time, at which point they will be disclosed by telephone to Nathaniel Curry, as will any additional recommendations warranted by these results. Nathaniel Curry will receive a summary of his genetic counseling visit and a copy of his results once available. This information will also be available in Epic. We encouraged Nathaniel Curry to remain in contact with cancer genetics annually so that we can continuously update the family history and inform him of any changes in cancer genetics and testing that may be of benefit for his family. Nathaniel Curry questions were answered to his satisfaction today. Our  contact information was provided should additional questions or concerns arise.  Based on Nathaniel Curry family history, we recommended his maternal relatives also have genetic counseling and testing. Nathaniel Curry will let us know if we can be of any assistance in coordinating genetic counseling and/or testing for this family member.   Lastly, we encouraged Nathaniel Curry to remain in contact with cancer genetics annually so that we can continuously update the family  history and inform him of any changes in cancer genetics and testing that may be of benefit for this family.   Mr.  Curry questions were answered to his satisfaction today. Our contact information was provided should additional questions or concerns arise. Thank you for the referral and allowing [place] to share in the care of your patient.   Tana Felts, MS, [facility] Certified Genetic Counselor [email or web address] phone: [phone]  The patient was seen for a total of 15 minutes in face-to-face genetic counseling.

## 2018-10-08 ENCOUNTER — Other Ambulatory Visit: Payer: Self-pay | Admitting: Family Medicine

## 2018-10-08 DIAGNOSIS — Z800 Family history of malignant neoplasm of digestive organs: Secondary | ICD-10-CM | POA: Diagnosis not present

## 2018-10-08 DIAGNOSIS — Z809 Family history of malignant neoplasm, unspecified: Secondary | ICD-10-CM | POA: Diagnosis not present

## 2018-10-08 DIAGNOSIS — Z808 Family history of malignant neoplasm of other organs or systems: Secondary | ICD-10-CM | POA: Diagnosis not present

## 2018-10-08 DIAGNOSIS — Z803 Family history of malignant neoplasm of breast: Secondary | ICD-10-CM | POA: Diagnosis not present

## 2018-10-19 ENCOUNTER — Telehealth: Payer: Self-pay | Admitting: Genetics

## 2018-10-19 NOTE — Telephone Encounter (Signed)
Revealed negative genetic testing.   This normal result is reassuring and indicates that it is unlikely Nathaniel Curry has a hereditary predisposition to cancer.   However, genetic testing is not perfect, and cannot definitively rule out a hereditary cause.  It will be important for Nathaniel Curry to keep in contact with genetics to learn if any additional testing may be needed in the future.     He should continue following his doctors screening recommendations, increased colon screening is still indicated based on his family history.  We recommend his mat relatives also have genetic testing due to fhx- there could still be Lynch Syndrome in the family even though his results were  Negative.

## 2018-10-29 ENCOUNTER — Encounter: Payer: Self-pay | Admitting: Genetics

## 2018-10-29 ENCOUNTER — Ambulatory Visit: Payer: Self-pay | Admitting: Genetics

## 2018-10-29 DIAGNOSIS — Z808 Family history of malignant neoplasm of other organs or systems: Secondary | ICD-10-CM

## 2018-10-29 DIAGNOSIS — Z1379 Encounter for other screening for genetic and chromosomal anomalies: Secondary | ICD-10-CM

## 2018-10-29 DIAGNOSIS — Z8049 Family history of malignant neoplasm of other genital organs: Secondary | ICD-10-CM

## 2018-10-29 DIAGNOSIS — Z803 Family history of malignant neoplasm of breast: Secondary | ICD-10-CM

## 2018-10-29 DIAGNOSIS — Z800 Family history of malignant neoplasm of digestive organs: Secondary | ICD-10-CM

## 2018-10-29 NOTE — Progress Notes (Signed)
HPI:  Mr. Cassetta was previously seen in the [facility] on 10/07/2018 due to a family history of cancer and concerns regarding a hereditary predisposition to cancer. Please refer to our prior cancer genetics clinic note for more information regarding Mr. Harkleroad medical, social and family histories, and our assessment and recommendations, at the time. Mr. Beavers recent genetic test results were disclosed to him, as well as recommendations warranted by these results. These results and recommendations are discussed in more detail below.  CANCER HISTORY:   No history exists.     FAMILY HISTORY:  We obtained a detailed, 4-generation family history.  Significant diagnoses are listed below: Family History  Problem Relation Age of Onset  . Alcohol abuse Mother   . Cancer Mother        unk Lum Keas- was in appendix, peritoneal,, and colon  . Hypertension Mother   . Uterine cancer Mother 3  . Hyperlipidemia Father   . Hypertension Father   . Diabetes Father   . Colon cancer Father 74  . Hypertension Maternal Grandmother   . Alcohol abuse Maternal Grandmother   . Breast cancer Maternal Grandmother        dx >50  . Hypertension Maternal Grandfather   . Alcohol abuse Maternal Grandfather   . Melanoma Maternal Grandfather 44  . Hypertension Paternal Grandmother   . Kidney cancer Paternal Grandmother 30  . Liver cancer Paternal Grandmother   . Hypertension Paternal Grandfather     Mr. Rohleder has no children.  He has a full brother who is 63 with no cancer and no children.  He has a paternal half-brother who is 31.   Mr. Kobler father: 28, recently dx with colon cancer.  Paternal Aunts/Uncles: 1 paternal aunt in her 47's with no hx of cancer.  Paternal cousins: no hx of cancer Paternal grandfather: died older than 84, no hx of cancer.  Paternal grandmother:deceased. Hx of kidney and liver cancer in her 26's.   Mr. Krontz mother: died at 77 due to a cancer unk primary- he  reports it was in her appendix, peritoneum, colon. She died from this at 41.  She also had a hx of endometiral cacner in ehr 30's.  Maternal Aunts/Uncles: 1 maternal aunt is 63 with no hx of cancer.  She is currently being evaluated for lynch syndrome at [facility]- no hx of cancer.  Maternal cousins: 2 male cousins, no hx of cancer Maternal grandfather: died in his 29's due to metastatic malanoma Maternal grandmother:in her 29's, hx of breast cancer dx >50  Patient's maternal ancestors are of N. European descent, and paternal ancestors are of N. European descent. There is no reported Ashkenazi Jewish ancestry. There is no known consanguinity.  GENETIC TEST RESULTS: Genetic testing performed through [facility]'s Multi-Cancer Panel reported out on 10/18/2018 showed no pathogenic mutations.   The Multi-Cancer Panel offered by [facility] includes sequencing and/or deletion duplication testing of the following 90 genes: AIP, ALK, APC, ATM, AXIN2, BAP1, BARD1, BLM, BMPR1A, BRCA1, BRCA2, BRIP1, BUB1B, CASR, CDC73, CDH1, CDK4, CDKN1B, CDKN1C, CDKN2A, CEBPA, CHEK2, CTNNA1, DICER1, DIS3L2, EGFR, ENG, EPCAM, FH, FLCN, GALNT12, GATA2, GPC3, GREM1, HOXB13, HRAS, KIT, MAX, MEN1, MET, MITF, MLH1, MLH3, MSH2, MSH3, MSH6, MUTYH, NBN, NF1, NF2, NTHL1, PALB2, PDGFRA, PHOX2B, PMS2, POLD1, POLE, POT1, PRKAR1A, PTCH1, PTEN, RAD50, RAD51C, RAD51D, RB1, RECQL4, RET, RNF43, RPS20, RUNX1, SDHA, SDHAF2, SDHB, SDHC, SDHD, SMAD4, SMARCA4, SMARCB1, SMARCE1, STK11, SUFU, TERC, TERT, TMEM127, TP53, TSC1, TSC2, VHL, WRN, WT1.  The test report  will be scanned into EPIC and will be located under the Molecular Pathology section of the Results Review tab. A portion of the result report is included below for reference.     We discussed with Mr. Tay that because current genetic testing is not perfect, it is possible there may be a gene mutation in one of these genes that current testing cannot detect, but that chance is small.  We also discussed,  that there could be another gene that has not yet been discovered, or that we have not yet tested, that is responsible for the cancer diagnoses in the family. It is also possible there is a hereditary cause for the cancer in the family that Mr. Schauer did not inherit and therefore was not identified in his testing.  Therefore, it is important to remain in touch with cancer genetics in the future so that we can continue to offer Mr. Kerstein the most up to date genetic testing.   ADDITIONAL GENETIC TESTING: We discussed with Mr. Enis that his genetic testing was fairly extensive.  If there are are genes identified to increase cancer risk that can be analyzed in the future, we would be happy to discuss and coordinate this testing at that time.    CANCER SCREENING RECOMMENDATIONS: Mr. Schmieder test result is considered negative (normal).  This means that we have not identified a hereditary predisposition to cancer in him at this time.   While reassuring, this does not definitively rule out a hereditary predisposition to cancer. It is still possible that there could be genetic mutations that are undetectable by current technology, or genetic mutations in genes that have not been tested or identified to increase cancer risk.  Therefore, it is recommended he continue to follow the cancer management and screening guidelines provided by his oncology and primary healthcare provider. An individual's cancer risk is not determined by genetic test results alone.  Overall cancer risk assessment includes additional factors such as personal medical history, family history, etc.  These should be used to make a personalized plan for cancer prevention and surveillance.    Given his family history of colon cancer, increased colon screening is warranted.  Mr. Damron reports he had a colonoscopy last year- we recommend he continue to follow his GI providers' recommendations regarding colonoscopy and GI screening.   RECOMMENDATIONS FOR  FAMILY MEMBERS:  Relatives in this family might be at some increased risk of developing cancer, over the general population risk, simply due to the family history of cancer.  We recommended women in this family have a yearly mammogram beginning at age 59, or 1 years younger than the earliest onset of cancer, an annual clinical breast exam, and perform monthly breast self-exams. Women in this family should also have a gynecological exam as recommended by their primary provider. All family members should have a colonoscopy  as directed by their doctors.  All family members should inform their physicians about the family history of cancer so their doctors can make the most appropriate screening recommendations for them.   It is also possible there is a hereditary cause for the cancer in Mr. Maddix family that he did not inherit and therefore was not identified in him.  Therefore, we recommended brother and other maternal relatives also have genetic counseling and testing. Mr. Maddix will let us know if we can be of any assistance in coordinating genetic counseling and/or testing for these family members.   FOLLOW-UP: Lastly, we discussed with Mr. Nanda that  cancer genetics is a rapidly advancing field and it is possible that new genetic tests will be appropriate for him and/or his family members in the future. We encouraged him to remain in contact with cancer genetics on an annual basis so we can update his personal and family histories and let him know of advances in cancer genetics that may benefit this family.   Our contact number was provided. Mr. Trettin questions were answered to his satisfaction, and he knows he is welcome to call us at anytime with additional questions or concerns.   Ferol Luz, MS, [facility] Certified Genetic Counselor [email or web address]

## 2018-11-16 ENCOUNTER — Telehealth: Payer: 59 | Admitting: Nurse Practitioner

## 2018-11-16 DIAGNOSIS — M549 Dorsalgia, unspecified: Secondary | ICD-10-CM | POA: Diagnosis not present

## 2018-11-16 NOTE — Progress Notes (Signed)
Based on what you shared with me it looks like you have radiculopathy,that should be evaluated in a face to face office visit. You may need further studies. This is not something I can treat in an e visit.  5-10 sent reviewing questionnaire,chart and documenting  NOTE: If you entered your credit card information for this eVisit, you will not be charged. You may see a "hold" on your card for the $30 but that hold will drop off and you will not have a charge processed.  If you are having a true medical emergency please call 911.  If you need an urgent face to face visit, [facility] has four urgent care centers for your convenience.  If you need care fast and have a high deductible or no insurance consider:   [email or web address] to reserve your spot online an avoid wait times  [facility] [address], [address] [place], [place] [address] 8 am to 8 pm Monday-Friday 10 am to 4 pm Saturday-Sunday *Across the street from [employer]  [place], [address] 8 am to 5 pm Monday-Friday * In the [facility] on the [facility]   The following sites will take your  insurance:  . [facility] [place] a Provider at this Location  [address] [place], [place] [address] . 10 am to 8 pm Monday-Friday . 12 pm to 8 pm Saturday-Sunday   . [facility] at [place] a Provider at this Location  [place] [place], [place] [address] . 8 am to 8 pm Monday-Friday . 9 am to 6 pm Saturday . 11 am to 6 pm Sunday   . [facility] at [place] Get Driving Directions  [address] [place],  [address] . 8 am to 8 pm Monday-Friday . 8 am to 4 pm Saturday-Sunday   Your e-visit answers were reviewed by a board certified advanced clinical  practitioner to complete your personal care plan.

## 2018-11-17 ENCOUNTER — Ambulatory Visit: Payer: Self-pay | Admitting: Family Medicine

## 2018-11-23 ENCOUNTER — Encounter: Payer: Self-pay | Admitting: Family Medicine

## 2018-11-23 ENCOUNTER — Ambulatory Visit: Payer: 59 | Admitting: Family Medicine

## 2018-11-23 VITALS — BP 140/88 | HR 97 | Temp 99.9°F | Ht 68.0 in | Wt 348.4 lb

## 2018-11-23 DIAGNOSIS — F329 Major depressive disorder, single episode, unspecified: Secondary | ICD-10-CM

## 2018-11-23 DIAGNOSIS — M5412 Radiculopathy, cervical region: Secondary | ICD-10-CM | POA: Diagnosis not present

## 2018-11-23 DIAGNOSIS — F419 Anxiety disorder, unspecified: Secondary | ICD-10-CM

## 2018-11-23 DIAGNOSIS — F32A Depression, unspecified: Secondary | ICD-10-CM

## 2018-11-23 MED ORDER — ESCITALOPRAM OXALATE 20 MG PO TABS: 20.0000 mg | ORAL_TABLET | Freq: Every day | ORAL | 1 refills | Status: DC

## 2018-11-23 MED ORDER — PREDNISONE 20 MG PO TABS: 40.0000 mg | ORAL_TABLET | Freq: Every day | ORAL | 0 refills | Status: DC

## 2018-11-23 NOTE — Patient Instructions (Signed)
Nice to see you. We will increase your Lexapro. Please get your MRI scheduled.  We will treat you with prednisone.  If your symptoms do not improve please let us know.  If you have worsening symptoms or you develop worsening weakness or significantly worsening pain or fevers please be evaluated.

## 2018-11-23 NOTE — Progress Notes (Signed)
Tommi Rumps, MD Phone: [phone]  Nathaniel Curry is a 38 y.o. male who presents today for follow-up.  CC: Cervical radiculopathy, anxiety  Cervical radiculopathy: Patient notes this started 2 weeks ago.  He notes no injury.  He has had a fusion of C5-C7.  He notes radicular symptoms radiating from his left neck down to his left scapula down the back of his left upper arm and to the ulnar aspect of his left lower arm.  Notes burning sensation in his left triceps.  He notes tingling sensation along the ulnar aspect of his left lower arm.  He notes pain with abduction of his arm.  He does feel there is some weakness.  Some numbness in the distribution of his radicular symptoms.  He has tried Lodine and a muscle relaxer with no benefit.  He had one prednisone left and that did help some with the discomfort though not the sensation of weakness.  He has been sleeping in a collar from his prior surgery and that does help some as well.  He denies fevers.  T-max 99.3 at home.  He notes these are the exact same symptoms he had that led to his prior fusion.  Anxiety: He notes this may be slightly worse.  He has been on Lexapro for the last 2 months and has not noticed a difference.  He notes no difference with the BuSpar.  Borderline on depression.  No SI.  Social History   Tobacco Use  Smoking Status Never Smoker  Smokeless Tobacco Never Used     ROS see history of present illness  Objective  Physical Exam Vitals:   11/23/18 1501  BP: 140/88  Pulse: 97  Temp: 99.9 F (37.7 C)  SpO2: 96%    BP Readings from Last 3 Encounters:  11/23/18 140/88  09/28/18 130/88  09/03/18 130/81   Wt Readings from Last 3 Encounters:  11/23/18 (!) 348 lb 6.4 oz (158 kg)  09/28/18 (!) 341 lb 12.8 oz (155 kg)  06/16/18 (!) 342 lb 12.8 oz (155.5 kg)    Physical Exam Constitutional:      General: He is not in acute distress.    Appearance: He is not diaphoretic.  Cardiovascular:     Rate and  Rhythm: Normal rate and regular rhythm.     Heart sounds: Normal heart sounds.  Pulmonary:     Effort: Pulmonary effort is normal.     Breath sounds: Normal breath sounds.  Musculoskeletal:     Comments: No midline neck tenderness, no midline neck step-off, there is muscular tenderness in the left side of his neck, range of motion on flexion and extension limited by some discomfort, full range of motion on rotation bilaterally  Skin:    General: Skin is warm and dry.  Neurological:     Mental Status: He is alert.     Comments: 5/5 strength in bilateral biceps, triceps, grip, quads, hamstrings, plantar and dorsiflexion, 5/5 shoulder abduction on the right, 5/5 strength on shoulder abduction on the left though height of abduction is limited by discomfort, he does have discomfort on abduction of his left shoulder, sensation to light touch intact in bilateral UE and LE, normal gait, 2+ patellar, biceps, and brachioradialis reflexes      Assessment/Plan: Please see individual problem list.  Cervical radiculopathy Symptoms consistent with nerve impingement.  Given radicular symptoms with his prior surgical history we will obtain an MRI cervical spine.  The patient noted he would be able to get  this scheduled quicker than we would and he will arrange for this.  I did check with our referral coordinator and she noted that he did not need prior approval for the MRI.  We will treat with prednisone.  Referred to neurosurgery as well.  He is given return precautions.  Anxiety and depression Slightly worsened.  We will increase his Lexapro.  He will follow-up as scheduled.   Orders Placed This Encounter  Procedures  . MR Cervical Spine Wo Contrast    Standing Status:   Future    Standing Expiration Date:   01/22/2020    Order Specific Question:   What is the patient's sedation requirement?    Answer:   No Sedation    Order Specific Question:   Does the patient have a pacemaker or implanted devices?      Answer:   No    Order Specific Question:   Preferred imaging location?    Answer:   [facility] (table limit-400lbs)    Order Specific Question:   Radiology Contrast Protocol - do NOT remove file path    Answer:   [email or web address]  . Ambulatory referral to Neurosurgery    Referral Priority:   Routine    Referral Type:   Surgical    Referral Reason:   Specialty Services Required    Requested Specialty:   Neurosurgery    Number of Visits Requested:   1    Meds ordered this encounter  Medications  . predniSONE (DELTASONE) 20 MG tablet    Sig: Take 2 tablets (40 mg total) by mouth daily with breakfast.    Dispense:  10 tablet    Refill:  0  . escitalopram (LEXAPRO) 20 MG tablet    Sig: Take 1 tablet (20 mg total) by mouth daily.    Dispense:  90 tablet    Refill:  1     Tommi Rumps, MD [place]

## 2018-11-23 NOTE — Assessment & Plan Note (Signed)
Symptoms consistent with nerve impingement.  Given radicular symptoms with his prior surgical history we will obtain an MRI cervical spine.  The patient noted he would be able to get this scheduled quicker than we would and he will arrange for this.  I did check with our referral coordinator and she noted that he did not need prior approval for the MRI.  We will treat with prednisone.  Referred to neurosurgery as well.  He is given return precautions.

## 2018-11-23 NOTE — Assessment & Plan Note (Signed)
Slightly worsened.  We will increase his Lexapro.  He will follow-up as scheduled.

## 2018-12-01 ENCOUNTER — Ambulatory Visit
Admission: RE | Admit: 2018-12-01 | Discharge: 2018-12-01 | Disposition: A | Discharge status: Home / Self Care | Payer: 59 | Source: Ambulatory Visit | Attending: Family Medicine | Admitting: Family Medicine

## 2018-12-01 DIAGNOSIS — M5412 Radiculopathy, cervical region: Secondary | ICD-10-CM | POA: Insufficient documentation

## 2018-12-01 DIAGNOSIS — M542 Cervicalgia: Secondary | ICD-10-CM | POA: Diagnosis not present

## 2018-12-07 DIAGNOSIS — M5412 Radiculopathy, cervical region: Secondary | ICD-10-CM | POA: Diagnosis not present

## 2018-12-10 ENCOUNTER — Telehealth: Payer: 59 | Admitting: Family

## 2018-12-10 DIAGNOSIS — J111 Influenza due to unidentified influenza virus with other respiratory manifestations: Secondary | ICD-10-CM | POA: Diagnosis not present

## 2018-12-10 DIAGNOSIS — R6889 Other general symptoms and signs: Secondary | ICD-10-CM

## 2018-12-10 MED ORDER — ONDANSETRON 4 MG PO TBDP: 4.0000 mg | ORAL_TABLET | Freq: Three times a day (TID) | ORAL | 0 refills | Status: DC | PRN

## 2018-12-10 MED ORDER — OSELTAMIVIR PHOSPHATE 75 MG PO CAPS: 75.0000 mg | ORAL_CAPSULE | Freq: Two times a day (BID) | ORAL | 0 refills | Status: DC

## 2018-12-10 NOTE — Addendum Note (Signed)
Addended by: Dutch Quint B on: 12/10/2018 02:01 PM   Modules accepted: Orders

## 2018-12-10 NOTE — Progress Notes (Signed)

## 2018-12-28 ENCOUNTER — Ambulatory Visit: Payer: Self-pay | Admitting: Family Medicine

## 2018-12-28 ENCOUNTER — Ambulatory Visit (INDEPENDENT_AMBULATORY_CARE_PROVIDER_SITE_OTHER): Payer: 59 | Admitting: Family Medicine

## 2018-12-28 ENCOUNTER — Encounter: Payer: Self-pay | Admitting: Family Medicine

## 2018-12-28 DIAGNOSIS — F419 Anxiety disorder, unspecified: Secondary | ICD-10-CM

## 2018-12-28 DIAGNOSIS — M5412 Radiculopathy, cervical region: Secondary | ICD-10-CM | POA: Diagnosis not present

## 2018-12-28 DIAGNOSIS — Z13220 Encounter for screening for lipoid disorders: Secondary | ICD-10-CM

## 2018-12-28 DIAGNOSIS — F329 Major depressive disorder, single episode, unspecified: Secondary | ICD-10-CM | POA: Diagnosis not present

## 2018-12-28 DIAGNOSIS — Z0001 Encounter for general adult medical examination with abnormal findings: Secondary | ICD-10-CM | POA: Diagnosis not present

## 2018-12-28 DIAGNOSIS — F32A Depression, unspecified: Secondary | ICD-10-CM

## 2018-12-28 NOTE — Assessment & Plan Note (Signed)
Anxiety will likely improve with change in job.  He will monitor.

## 2018-12-28 NOTE — Progress Notes (Signed)
Tommi Rumps, MD Phone: [phone]  Nathaniel Curry is a 38 y.o. male who presents today for physical exam.  Exercises by walking 3 days a week. He notes his diet is not good.  He is back to drinking 2-3 sodas a day and only eating 1 meal daily.  He knows this is not good. Tetanus vaccine and flu vaccine up-to-date.   He declines HIV screening. He is sexually active with one partner and uses condoms. No tobacco use, alcohol use, or illicit drug use.  No family history of prostate cancer.  He does report a family history of colon cancer in his mother at age 62.  Recently had genetic testing.  This appears to have been negative.  Colonoscopy in 2017 was negative. He does see a Pharmacist, community and ophthalmologist regularly. 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 His neck and left upper extremity have improved about 75%.  He does note that he is able to abduct his left arm better than previously though his whole arm does feel heavy.  He did see neurosurgery and they placed him on a Medrol Dosepak which helped significantly.  They are working on getting him an injection.  He reports they do not think he will need a surgery.  Active Ambulatory Problems    Diagnosis Date Noted  . Essential (primary) hypertension 05/02/2013  . Nephrolithiasis 02/08/2014  . GERD (gastroesophageal reflux disease) 05/22/2017  . Barrett esophagus 05/22/2017  . Vitamin D deficiency 05/22/2017  . Chronic fatigue 05/22/2017  . History of gout 05/22/2017  . Iron deficiency anemia 05/28/2017  . Tachycardia 06/01/2017  . SOB (shortness of breath) on exertion 06/01/2017  . Diaphoresis 06/01/2017  . Morbid obesity ([place]) 10/28/2017  . Chronic headaches 06/17/2018  . Anxiety and depression 06/17/2018  . Pain of left heel 09/28/2018  . Family history of uterine cancer   . Family history of melanoma   . Family history of breast  cancer   . Family history of colon cancer 10/07/2018  . Genetic testing 10/29/2018  . Cervical radiculopathy 11/23/2018  . Encounter for general adult medical examination with abnormal findings 12/28/2018   Resolved Ambulatory Problems    Diagnosis Date Noted  . Absolute anemia 05/22/2017   Past Medical History:  Diagnosis Date  . Allergy   . Anemia   . Barrett's esophagus   . Gout   . Gout   . History of kidney stones   . Hypertension   . Obesity     Family History  Problem Relation Age of Onset  . Alcohol abuse Mother   . Cancer Mother        unk Lum [place]- was in appendix, peritoneal,, and colon  . Hypertension Mother   . Uterine cancer Mother 69  . Hyperlipidemia Father   . Hypertension Father   . Diabetes Father   . Colon cancer Father 96  . Hypertension Maternal Grandmother   . Alcohol abuse Maternal Grandmother   . Breast cancer Maternal Grandmother        dx >50  . Hypertension Maternal Grandfather   . Alcohol abuse Maternal Grandfather   . Melanoma Maternal Grandfather 3  . Hypertension Paternal Grandmother   . Kidney cancer Paternal Grandmother 52  . Liver cancer Paternal Grandmother   . Hypertension Paternal Grandfather     Social History   Socioeconomic History  . Marital status: Single  Spouse name: Not on file  . Number of children: Not on file  . Years of education: Not on file  . Highest education level: Not on file  Occupational History    Employer: [employer]  Social Needs  . Financial resource strain: Not on file  . Food insecurity:    Worry: Not on file    Inability: Not on file  . Transportation needs:    Medical: Not on file    Non-medical: Not on file  Tobacco Use  . Smoking status: Never Smoker  . Smokeless tobacco: Never Used  Substance and Sexual Activity  . Alcohol use: No    Alcohol/week: 0.0 standard drinks  . Drug use: No  . Sexual activity: Never  Lifestyle  . Physical activity:    Days per  week: Not on file    Minutes per session: Not on file  . Stress: Not on file  Relationships  . Social connections:    Talks on phone: Not on file    Gets together: Not on file    Attends religious service: Not on file    Active member of club or organization: Not on file    Attends meetings of clubs or organizations: Not on file    Relationship status: Not on file  . Intimate partner violence:    Fear of current or ex partner: Not on file    Emotionally abused: Not on file    Physically abused: Not on file    Forced sexual activity: Not on file  Other Topics Concern  . Not on file  Social History Narrative  . Not on file    ROS  General:  Negative for nexplained weight loss, fever Skin: Negative for new or changing mole, sore that won't heal HEENT: Negative for trouble hearing, trouble seeing, ringing in ears, mouth sores, hoarseness, change in voice, dysphagia. CV:  Negative for chest pain, dyspnea, edema, palpitations Resp: Negative for cough, dyspnea, hemoptysis GI: Negative for nausea, vomiting, diarrhea, constipation, abdominal pain, melena, hematochezia. GU: Negative for dysuria, incontinence, urinary hesitance, hematuria, vaginal or penile discharge, polyuria, sexual difficulty, lumps in testicle or breasts MSK: Negative for muscle cramps or aches, joint pain or swelling Neuro: Positive for numbness, weakness, negative for headaches, dizziness, passing out/fainting Psych: Positive for anxiety, stress, negative for depression, memory problems  Objective  Physical Exam Vitals:   12/28/18 1613  BP: 130/88  Pulse: 96  Temp: 98.8 F (37.1 C)  SpO2: 96%    BP Readings from Last 3 Encounters:  12/28/18 130/88  11/23/18 140/88  09/28/18 130/88   Wt Readings from Last 3 Encounters:  12/28/18 (!) 343 lb (155.6 kg)  11/23/18 (!) 348 lb 6.4 oz (158 kg)  09/28/18 (!) 341 lb 12.8 oz (155 kg)    Physical Exam Constitutional:      General: He is not in acute  distress.    Appearance: He is not diaphoretic.  HENT:     Head: Normocephalic and atraumatic.     Mouth/Throat:     Mouth: Mucous membranes are moist.     Pharynx: Oropharynx is clear.  Eyes:     Conjunctiva/sclera: Conjunctivae normal.     Pupils: Pupils are equal, round, and reactive to light.  Cardiovascular:     Rate and Rhythm: Normal rate and regular rhythm.     Heart sounds: Normal heart sounds.  Pulmonary:     Effort: Pulmonary effort is normal.     Breath sounds:  Normal breath sounds.  Abdominal:     General: Bowel sounds are normal. There is no distension.     Palpations: Abdomen is soft.     Tenderness: There is no abdominal tenderness.  Musculoskeletal:     Right lower leg: No edema.     Left lower leg: No edema.  Lymphadenopathy:     Cervical: No cervical adenopathy.  Skin:    General: Skin is warm and dry.  Neurological:     Mental Status: He is alert.     Comments: Bilateral upper extremities with intact light touch sensation, 5/5 strength bilateral biceps, triceps, and grip, 2+ biceps and brachioradialis reflexes      Assessment/Plan:   Encounter for general adult medical examination with abnormal findings Physical exam completed.  Encouraged to continue exercise.  Discussed decreasing soda intake by 1 soda daily to start with and increasing to 2 meals daily with healthier options.  He declined HIV screening.  Colon cancer screening is up-to-date given his family history.  Lab work as outlined below.  Anxiety and depression Anxiety will likely improve with change in job.  He will monitor.  Cervical radiculopathy Improving.  He will proceed with management through neurosurgery.   Orders Placed This Encounter  Procedures  . Comp Met (CMET)  . Lipid panel  . HgB A1c    No orders of the defined types were placed in this encounter.    Tommi Rumps, MD [place]

## 2018-12-28 NOTE — Assessment & Plan Note (Signed)
Physical exam completed.  Encouraged to continue exercise.  Discussed decreasing soda intake by 1 soda daily to start with and increasing to 2 meals daily with healthier options.  He declined HIV screening.  Colon cancer screening is up-to-date given his family history.  Lab work as outlined below.

## 2018-12-28 NOTE — Assessment & Plan Note (Signed)
Improving.  He will proceed with management through neurosurgery.

## 2018-12-28 NOTE — Patient Instructions (Signed)
Nice to see you. Please continue with walking for exercise and work on your diet as discussed. We will get lab work today and contact you with results.

## 2018-12-29 LAB — LIPID PANEL
Cholesterol: 172 mg/dL (ref 0–200)
HDL: 47 mg/dL (ref >39.00)
LDL Cholesterol: 90 mg/dL (ref 0–99)
NonHDL: 124.97
Total CHOL/HDL Ratio: 4
Triglycerides: 174 mg/dL — ABNORMAL HIGH (ref 0.0–149.0)
VLDL: 34.8 mg/dL (ref 0.0–40.0)

## 2018-12-29 LAB — COMPREHENSIVE METABOLIC PANEL
ALT: 17 U/L (ref 0–53)
AST: 20 U/L (ref 0–37)
Albumin: 4.7 g/dL (ref 3.5–5.2)
Alkaline Phosphatase: 61 U/L (ref 39–117)
BUN: 13 mg/dL (ref 6–23)
CO2: 26 mEq/L (ref 19–32)
CREATININE: 1.13 mg/dL (ref 0.40–1.50)
Calcium: 10 mg/dL (ref 8.4–10.5)
Chloride: 103 mEq/L (ref 96–112)
GFR: 73.11 mL/min (ref >60.00)
Glucose, Bld: 83 mg/dL (ref 70–99)
Potassium: 4 mEq/L (ref 3.5–5.1)
Sodium: 138 mEq/L (ref 135–145)
TOTAL PROTEIN: 8.1 g/dL (ref 6.0–8.3)
Total Bilirubin: 0.7 mg/dL (ref 0.2–1.2)

## 2018-12-29 LAB — HEMOGLOBIN A1C: Hgb A1c MFr Bld: 5.5 % (ref 4.6–6.5)

## 2019-02-03 ENCOUNTER — Telehealth: Payer: Self-pay

## 2019-02-03 NOTE — Telephone Encounter (Signed)
Pt was called on 02/01/19 and 4/16 concerning "Fast Track". Message was

## 2019-02-03 NOTE — Telephone Encounter (Signed)
Pt was called on 02/01/19 and again 02/03/19, no answer, message was left with call back number concerning fast track.

## 2019-02-05 IMAGING — CR DG FOOT COMPLETE 3+V*L*
3 series · 3 of 3 positions shown · non-contrast
Comparison: None.

CLINICAL DATA: Left heel pain.  No known injury.

EXAM:
LEFT FOOT - COMPLETE 3+ VIEW

[foot ap]
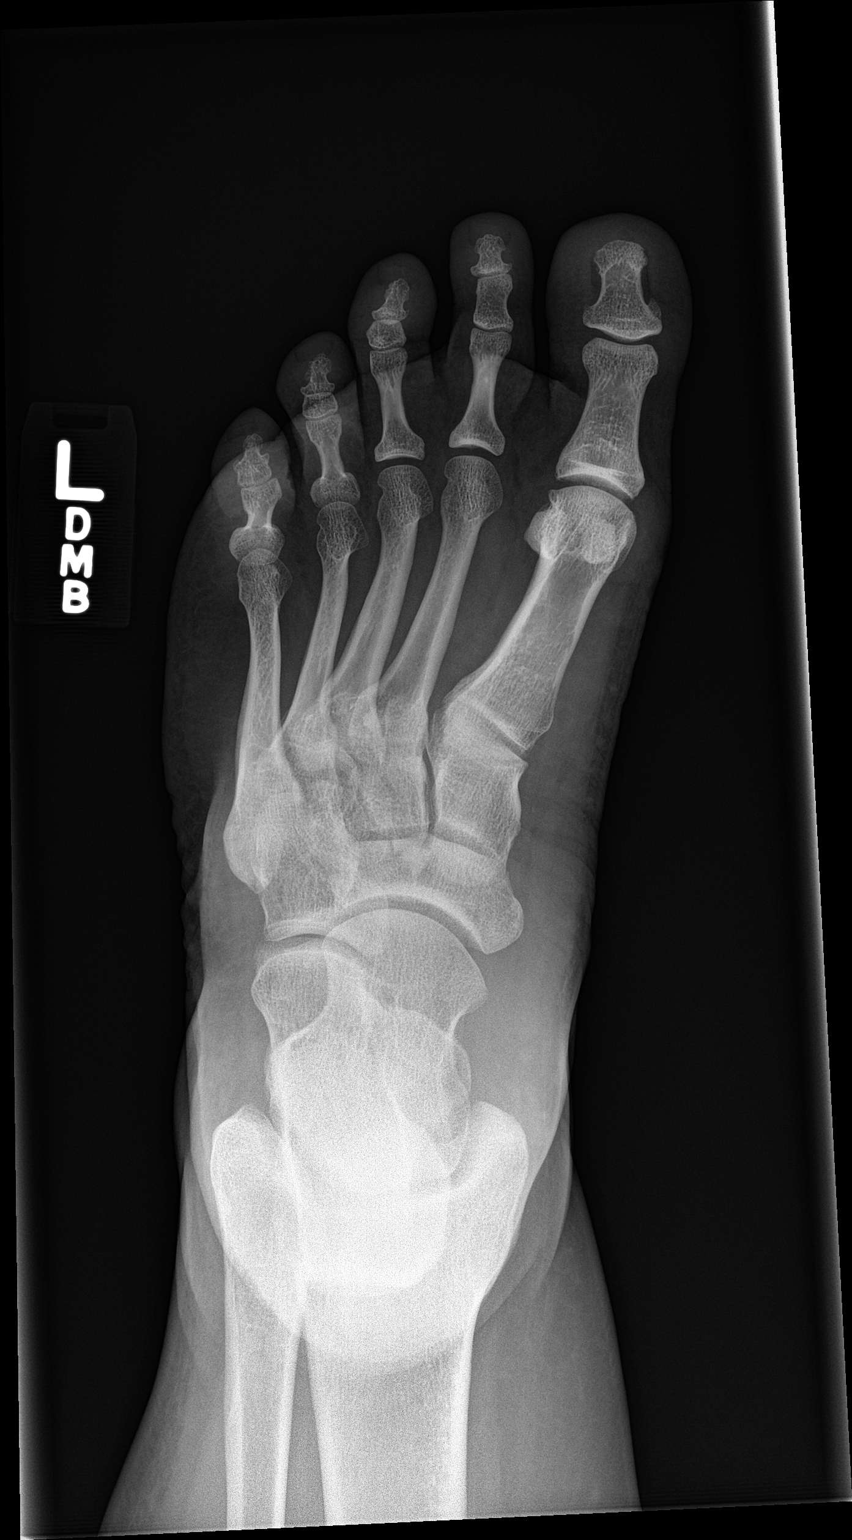

[foot obl]
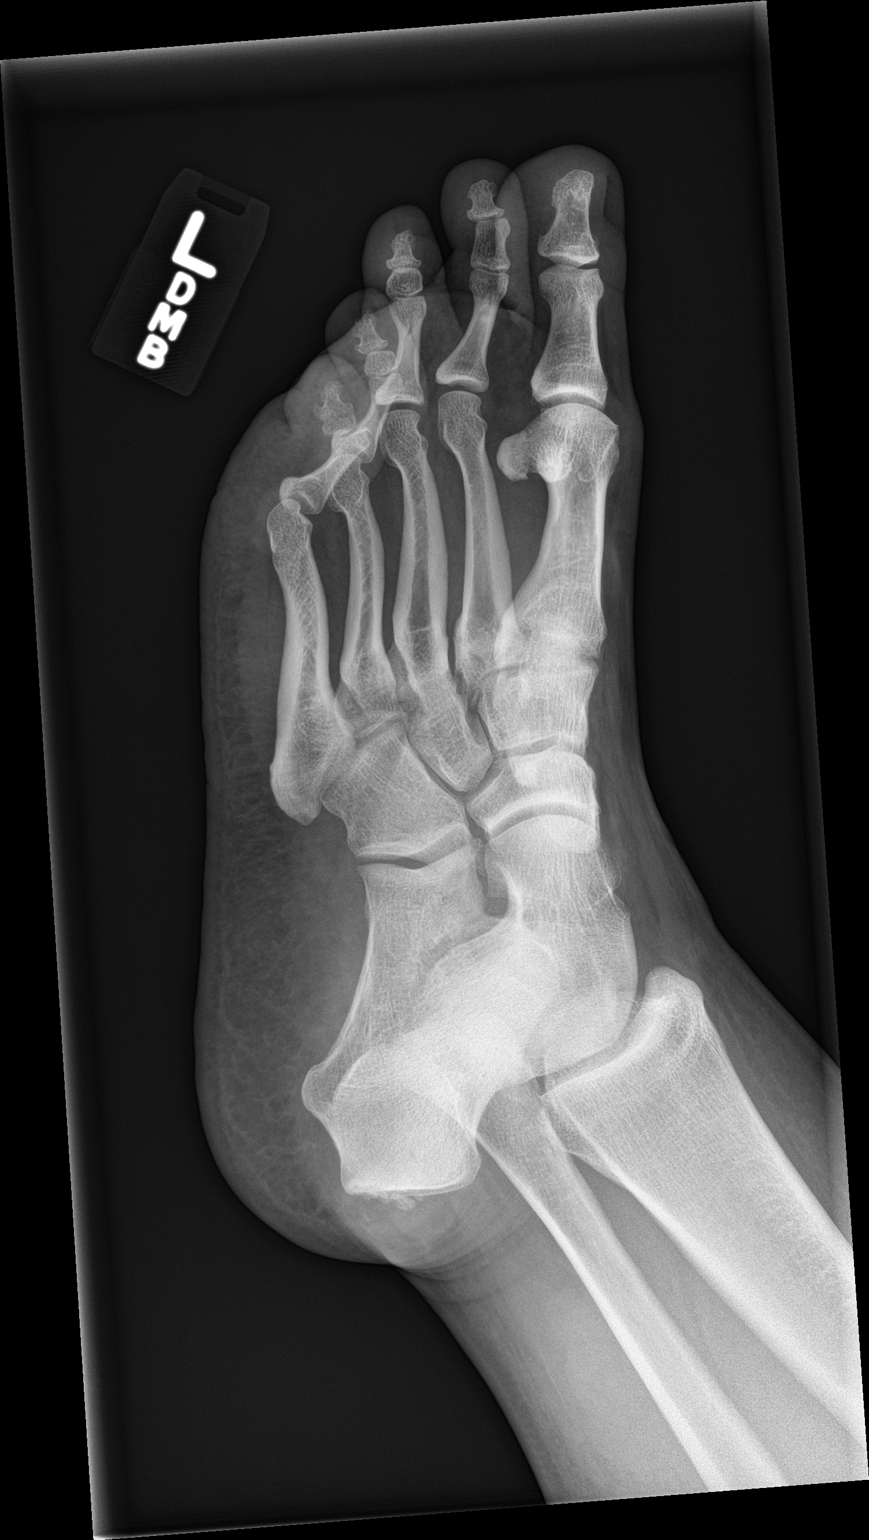

[foot lat]
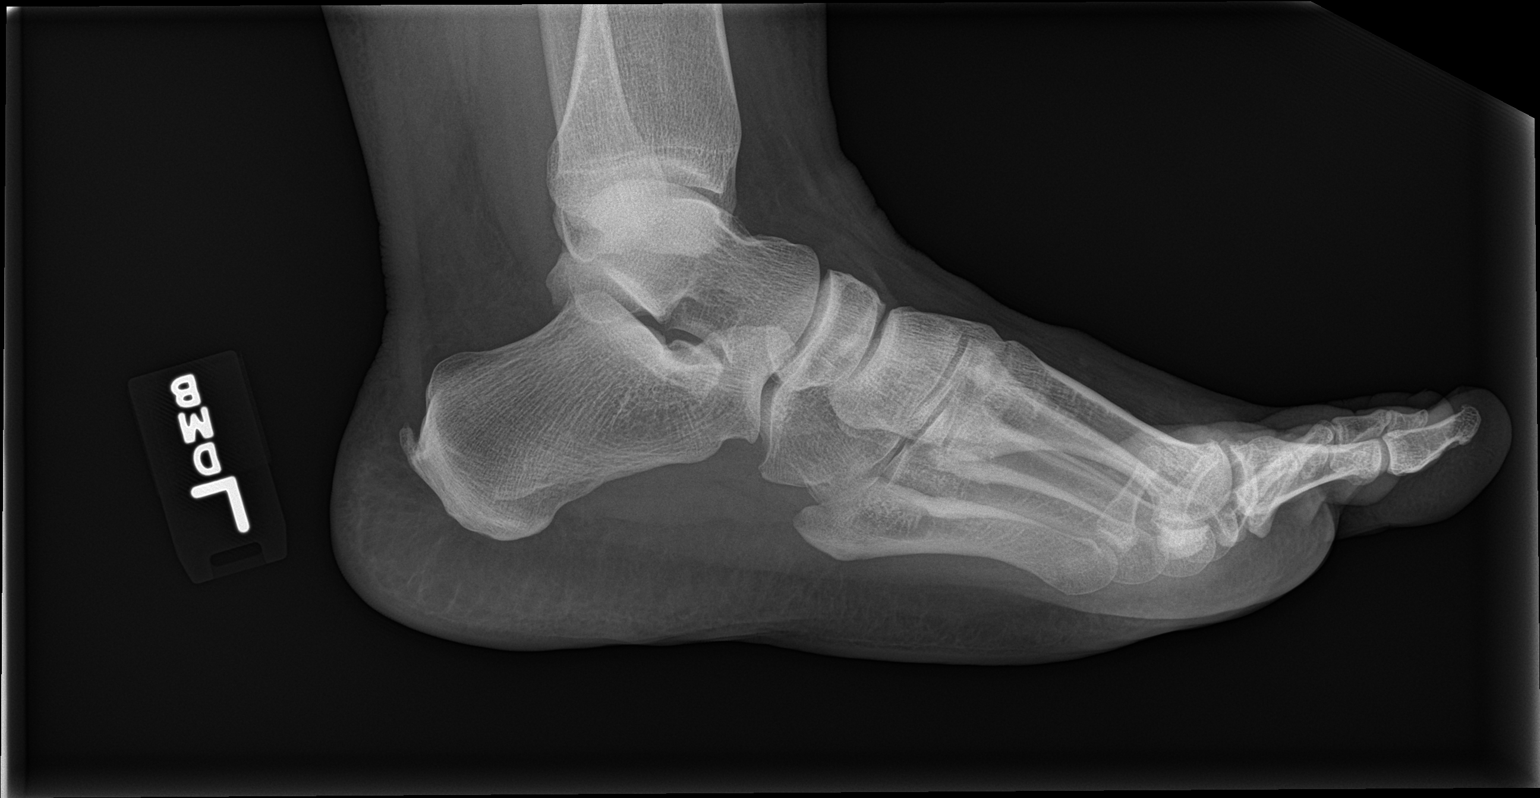

[3 of 3 positions shown; findings below may reference images not displayed]

FINDINGS: Apparent soft tissue edema adjacent to the calcaneus. Enthesopathic
changes at the Achilles insertion site on the calcaneus. No other
abnormalities.
IMPRESSION: Suspected soft tissue swelling in the region of the patient's
heel/calcaneus. Enthesopathic changes at the Achilles insertion
site.

## 2019-02-26 NOTE — Progress Notes (Signed)
Greater than 5 minutes, yet less than 10 minutes of time have been spent researching, coordinating, and implementing care for this patient today.  Thank you for the details you included in the comment boxes. Those details are very helpful in determining the best course of treatment for you and help us to provide the best care.  

## 2019-04-04 DIAGNOSIS — R1013 Epigastric pain: Secondary | ICD-10-CM | POA: Diagnosis not present

## 2019-04-26 ENCOUNTER — Other Ambulatory Visit: Payer: Self-pay

## 2019-04-26 ENCOUNTER — Other Ambulatory Visit
Admission: RE | Admit: 2019-04-26 | Discharge: 2019-04-26 | Disposition: A | Discharge status: Home / Self Care | Payer: 59 | Source: Ambulatory Visit | Attending: Gastroenterology | Admitting: Gastroenterology

## 2019-04-26 DIAGNOSIS — Z1159 Encounter for screening for other viral diseases: Secondary | ICD-10-CM | POA: Insufficient documentation

## 2019-04-26 DIAGNOSIS — Z01812 Encounter for preprocedural laboratory examination: Secondary | ICD-10-CM | POA: Diagnosis not present

## 2019-04-26 LAB — SARS CORONAVIRUS 2 (TAT 6-24 HRS): SARS Coronavirus 2: NEGATIVE

## 2019-04-29 ENCOUNTER — Encounter: Payer: Self-pay | Admitting: Anesthesiology

## 2019-04-29 ENCOUNTER — Encounter
Admission: RE | Disposition: A | Discharge status: Home / Self Care | Payer: Self-pay | Source: Home / Self Care | Attending: Gastroenterology

## 2019-04-29 ENCOUNTER — Ambulatory Visit: Payer: 59 | Admitting: Anesthesiology

## 2019-04-29 ENCOUNTER — Ambulatory Visit: Payer: 59 | Admitting: Family Medicine

## 2019-04-29 ENCOUNTER — Telehealth: Payer: Self-pay

## 2019-04-29 ENCOUNTER — Ambulatory Visit
Admission: RE | Admit: 2019-04-29 | Discharge: 2019-04-29 | Disposition: A | Discharge status: Home / Self Care | Payer: 59 | Attending: Gastroenterology | Admitting: Gastroenterology

## 2019-04-29 DIAGNOSIS — E669 Obesity, unspecified: Secondary | ICD-10-CM | POA: Diagnosis not present

## 2019-04-29 DIAGNOSIS — K259 Gastric ulcer, unspecified as acute or chronic, without hemorrhage or perforation: Secondary | ICD-10-CM | POA: Diagnosis not present

## 2019-04-29 DIAGNOSIS — Z6841 Body Mass Index (BMI) 40.0 and over, adult: Secondary | ICD-10-CM | POA: Insufficient documentation

## 2019-04-29 DIAGNOSIS — K219 Gastro-esophageal reflux disease without esophagitis: Secondary | ICD-10-CM | POA: Diagnosis present

## 2019-04-29 DIAGNOSIS — F329 Major depressive disorder, single episode, unspecified: Secondary | ICD-10-CM | POA: Diagnosis not present

## 2019-04-29 DIAGNOSIS — K449 Diaphragmatic hernia without obstruction or gangrene: Secondary | ICD-10-CM | POA: Diagnosis not present

## 2019-04-29 DIAGNOSIS — Z8719 Personal history of other diseases of the digestive system: Secondary | ICD-10-CM | POA: Diagnosis not present

## 2019-04-29 DIAGNOSIS — K209 Esophagitis, unspecified: Secondary | ICD-10-CM | POA: Diagnosis not present

## 2019-04-29 DIAGNOSIS — F419 Anxiety disorder, unspecified: Secondary | ICD-10-CM | POA: Diagnosis not present

## 2019-04-29 DIAGNOSIS — K2950 Unspecified chronic gastritis without bleeding: Secondary | ICD-10-CM | POA: Insufficient documentation

## 2019-04-29 DIAGNOSIS — Z79899 Other long term (current) drug therapy: Secondary | ICD-10-CM | POA: Diagnosis not present

## 2019-04-29 DIAGNOSIS — K2210 Ulcer of esophagus without bleeding: Secondary | ICD-10-CM | POA: Diagnosis not present

## 2019-04-29 DIAGNOSIS — Z9884 Bariatric surgery status: Secondary | ICD-10-CM | POA: Insufficient documentation

## 2019-04-29 DIAGNOSIS — I10 Essential (primary) hypertension: Secondary | ICD-10-CM | POA: Insufficient documentation

## 2019-04-29 DIAGNOSIS — K210 Gastro-esophageal reflux disease with esophagitis: Secondary | ICD-10-CM | POA: Insufficient documentation

## 2019-04-29 DIAGNOSIS — Z7689 Persons encountering health services in other specified circumstances: Secondary | ICD-10-CM | POA: Diagnosis not present

## 2019-04-29 DIAGNOSIS — M109 Gout, unspecified: Secondary | ICD-10-CM | POA: Insufficient documentation

## 2019-04-29 HISTORY — PX: ESOPHAGOGASTRODUODENOSCOPY (EGD) WITH PROPOFOL: SHX5813

## 2019-04-29 SURGERY — ESOPHAGOGASTRODUODENOSCOPY (EGD) WITH PROPOFOL
Anesthesia: General

## 2019-04-29 MED ORDER — SODIUM CHLORIDE 0.9 % IV SOLN: INTRAVENOUS | Status: DC

## 2019-04-29 MED ORDER — LIDOCAINE HCL (CARDIAC) PF 100 MG/5ML IV SOSY
PREFILLED_SYRINGE | INTRAVENOUS | Status: DC | PRN
  Administered 2019-04-29: 50 mg via INTRATRACHEAL

## 2019-04-29 MED ORDER — GLYCOPYRROLATE 0.2 MG/ML IJ SOLN
INTRAMUSCULAR | Status: DC | PRN
  Administered 2019-04-29: 0.2 mg via INTRAVENOUS

## 2019-04-29 MED ORDER — LIDOCAINE HCL (PF) 2 % IJ SOLN
INTRAMUSCULAR | Status: AC
  Filled 2019-04-29: qty 10

## 2019-04-29 MED ORDER — PROPOFOL 500 MG/50ML IV EMUL
INTRAVENOUS | Status: DC | PRN
  Administered 2019-04-29: 125 ug/kg/min via INTRAVENOUS

## 2019-04-29 MED ORDER — SODIUM CHLORIDE 0.9 % IV SOLN
INTRAVENOUS | Status: DC
  Administered 2019-04-29: 10:00:00 1000 mL via INTRAVENOUS

## 2019-04-29 MED ORDER — PROPOFOL 10 MG/ML IV BOLUS
INTRAVENOUS | Status: DC | PRN
  Administered 2019-04-29: 150 mg via INTRAVENOUS

## 2019-04-29 MED ORDER — PROPOFOL 500 MG/50ML IV EMUL
INTRAVENOUS | Status: AC
  Filled 2019-04-29: qty 50

## 2019-04-29 MED ORDER — PROPOFOL 10 MG/ML IV BOLUS
INTRAVENOUS | Status: AC
  Filled 2019-04-29: qty 20

## 2019-04-29 NOTE — Anesthesia Preprocedure Evaluation (Signed)
Anesthesia Evaluation  Patient identified by MRN, date of birth, ID band Patient awake    Reviewed: Allergy & Precautions, NPO status , Patient's Chart, lab work & pertinent test results  History of Anesthesia Complications Negative for: history of anesthetic complications  Airway Mallampati: I  TM Distance: >3 FB Neck ROM: Full    Dental no notable dental hx.    Pulmonary neg pulmonary ROS, neg sleep apnea, neg COPD,    breath sounds clear to auscultation- rhonchi (-) wheezing      Cardiovascular Exercise Tolerance: Good hypertension, Pt. on medications (-) CAD, (-) Past MI and (-) Cardiac Stents  Rhythm:Regular Rate:Normal - Systolic murmurs and - Diastolic murmurs    Neuro/Psych  Headaches, PSYCHIATRIC DISORDERS Anxiety Depression  Neuromuscular disease    GI/Hepatic Neg liver ROS, GERD  ,  Endo/Other  negative endocrine ROSneg diabetes  Renal/GU Renal disease: hx of nephrolithiasis.stones     Musculoskeletal negative musculoskeletal ROS (+)   Abdominal (+) + obese,   Peds  Hematology  (+) anemia ,   Anesthesia Other Findings Past Medical History: No date: Allergy No date: Anemia No date: Barrett's esophagus No date: GERD (gastroesophageal reflux disease) No date: Gout No date: Gout No date: History of kidney stones No date: Hypertension 05/28/2017: Iron deficiency anemia 02/08/2014: Nephrolithiasis No date: Obesity   Reproductive/Obstetrics                             Anesthesia Physical  Anesthesia Plan  ASA: II  Anesthesia Plan: General   Post-op Pain Management:    Induction: Intravenous  PONV Risk Score and Plan: 1 and Propofol infusion  Airway Management Planned: Natural Airway  Additional Equipment:   Intra-op Plan:   Post-operative Plan:   Informed Consent: I have reviewed the patients History and Physical, chart, labs and discussed the procedure including  the risks, benefits and alternatives for the proposed anesthesia with the patient or authorized representative who has indicated his/her understanding and acceptance.     Dental advisory given  Plan Discussed with: CRNA and Anesthesiologist  Anesthesia Plan Comments:         Anesthesia Quick Evaluation

## 2019-04-29 NOTE — Anesthesia Procedure Notes (Signed)
Performed by: Babs Sciara, CRNA Oxygen Delivery Method: Nasal cannula

## 2019-04-29 NOTE — Op Note (Signed)
[facility] Gastroenterology Patient Name: Nathaniel Curry Procedure Date: 04/29/2019 10:01 AM MRN: [id number] Account #: [id number] Date of Birth: October 02, 1981 Admit Type: Outpatient Age: 38 Room: [facility] ENDO ROOM 1 Gender: Male Note Status: Finalized Procedure:            Upper GI endoscopy Indications:          Gastro-esophageal reflux disease, Failure to respond to                        medical treatment, Follow-up of Barrett's esophagus Providers:            Lollie Sails, MD Medicines:            Monitored Anesthesia Care Complications:        No immediate complications. Procedure:            Pre-Anesthesia Assessment:                       - ASA Grade Assessment: II - A patient with mild                        systemic disease.                       After obtaining informed consent, the endoscope was                        passed under direct vision. Throughout the procedure,                        the patient's blood pressure, pulse, and oxygen                        saturations were monitored continuously. The Endoscope                        was introduced through the mouth, and advanced to the                        third part of duodenum. The upper GI endoscopy was                        accomplished without difficulty. The patient tolerated                        the procedure well. Findings:      LA Grade C (one or more mucosal breaks continuous between tops of 2 or       more mucosal folds, less than 75% circumference) esophagitis with no       bleeding was found.      There were esophageal mucosal changes consistent with short-segment       Barrett's esophagus present in the lower third of the esophagus. The       maximum longitudinal extent of these mucosal changes was 2 cm in length.       Mucosa was biopsied with a cold forceps for histology in 4 quadrants at       intervals of 2 cm at 32 and 34 cm from the incisors. A total of 2       specimen  bottles were sent to pathology.  Evidence of a sleeve gastrectomy was found in the gastric body. This was       characterized by healthy appearing mucosa. 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 With possible       impairmant of drainage in that area.      Patchy mild inflammation characterized by congestion (edema) and       erosions was found in the gastric antrum and in the prepyloric region of       the stomach. Biopsies were taken with a cold forceps for histology.      Gastric biopsies also taken from the body of the stomach as well.      The examined duodenum was normal. Impression:           - LA Grade C reflux esophagitis.                       - Esophageal mucosal changes consistent with                        short-segment Barrett's esophagus. Biopsied.                       - A sleeve gastrectomy was found, characterized by                        healthy appearing mucosa.                       - Bile gastritis. Biopsied.                       - Normal examined duodenum. Recommendation:       - Discharge patient to home.                       - Continue present medications.                       - Change timing of the PPI to half hour before                        breakfast and the second dose half hour before lunch.                       - Do an upper GI series at appointment to be scheduled.                       - Consider CT chest and abdomen and consultation with                        surgeon for possible consideration of fundoplication. Procedure Code(s):    --- Professional ---                       [phone], Esophagogastroduodenoscopy, flexible, transoral;                        with biopsy, single or multiple CPT copyright 2019 American Medical Association. All rights reserved. The  codes documented in this report are preliminary and upon coder  review may  be revised to meet current compliance requirements. Lollie Sails, MD 04/29/2019 11:18:23 AM This report has been signed electronically. Number of Addenda: 0 Note Initiated On: 04/29/2019 10:01 AM      [facility]

## 2019-04-29 NOTE — H&P (Signed)
Outpatient short stay form Pre-procedure 04/29/2019 10:34 AM Lollie Sails MD  Primary Physician: Tommi Rumps, MD  Reason for visit: Patient is a 38 year old male presenting today for an EGD regards to his history of gastroesophagitis and Barrett's esophagus.  History of present illness: Patient has a history of Barrett's esophagus and has been having dyspepsia as well as some increased reflux symptoms.  He has had his symptoms are handled well taking Dexilant however he has not been able to get that through his insurance at this point.  He is on a twice daily PPI otherwise and has trialed some Carafate that seems to help.  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  Takes no aspirin or blood thinning agent.  Takes no NSAIDs.    Current Facility-Administered Medications:  .  0.9 %  sodium chloride infusion, , Intravenous, Continuous, Lollie Sails, MD, Last Rate: 20 mL/hr at 04/29/19 1028, 1,000 mL at 04/29/19 1028 .  0.9 %  sodium chloride infusion, , Intravenous, Continuous, Lollie Sails, MD  Medications Prior to Admission  Medication Sig Dispense Refill Last Dose  . allopurinol (ZYLOPRIM) 100 MG tablet Take 1 tablet (100 mg total) by mouth daily. 90 tablet 1 Past Week at Unknown time  . amlodipine-olmesartan (AZOR) 10-20 MG tablet Take 1 tablet by mouth daily. 90 tablet 3 04/28/2019 at Unknown time  . busPIRone (BUSPAR) 7.5 MG tablet Take 1 tablet (7.5 mg total) by mouth 2 (two) times daily. 180 tablet 1 Past Week at Unknown time  . carvedilol (COREG) 12.5 MG tablet Take 1 tablet (12.5 mg total) by mouth 2 (two) times daily with a meal. 180 tablet 1 Past Week at Unknown time  . indapamide (LOZOL) 2.5 MG tablet Take 1 tablet (2.5 mg total) by mouth daily. 90 tablet 1 Past Week at Unknown time  . Iron-Vitamin C (VITRON-C) 65-125 MG TABS Take by mouth.   Past Week at Unknown time  . pantoprazole (PROTONIX) 40 MG  tablet Take 40 mg by mouth 2 (two) times daily.    04/28/2019 at Unknown time  . sucralfate (CARAFATE) 1 g tablet Take 1 g by mouth 4 (four) times daily -  with meals and at bedtime.   Past Week at Unknown time  . colchicine 0.6 MG tablet TAKE 1 TABLET (0.6 MG TOTAL) BY MOUTH DAILY. (Patient not taking: Reported on 04/28/2019) 30 tablet 2 Not Taking at Unknown time  . escitalopram (LEXAPRO) 20 MG tablet Take 1 tablet (20 mg total) by mouth daily. (Patient not taking: Reported on 04/28/2019) 90 tablet 1 Not Taking at Unknown time     No Known Allergies   Past Medical History:  Diagnosis Date  . Allergy   . Anemia   . Barrett's esophagus   . Diverticulosis 01/11/2016  . Family history of breast cancer   . Family history of colon cancer 10/07/2018  . Family history of melanoma   . Family history of uterine cancer   . GERD (gastroesophageal reflux disease)   . Gout   . Gout   . History of kidney stones   . Hypertension   . Iron deficiency anemia 05/28/2017  . Nephrolithiasis 02/08/2014  . Obesity     Review of systems:      Physical Exam    Heart and lungs: Regular rate and rhythm without rub or gallop, lungs are bilaterally clear.    HEENT: Normocephalic atraumatic eyes are  anicteric    Other:    Pertinant exam for procedure: Soft nontender nondistended bowel sounds positive normoactive.    Planned proceedures: EGD and indicated procedures. I have discussed the risks benefits and complications of procedures to include not limited to bleeding, infection, perforation and the risk of sedation and the patient wishes to proceed.    Lollie Sails, MD Gastroenterology 04/29/2019  10:34 AM

## 2019-04-29 NOTE — Telephone Encounter (Signed)
Patient was scheduled today and he had a procedure and needs to reschedule this appointment.  Nina,cma

## 2019-04-29 NOTE — Transfer of Care (Signed)
Immediate Anesthesia Transfer of Care Note  Patient: Nathaniel Curry  Procedure(s) Performed: ESOPHAGOGASTRODUODENOSCOPY (EGD) WITH PROPOFOL (N/A )  Patient Location: PACU and Endoscopy Unit  Anesthesia Type:General  Level of Consciousness: awake and drowsy  Airway & Oxygen Therapy: Patient Spontanous Breathing and Patient connected to nasal cannula oxygen  Post-op Assessment: Report given to RN and Post -op Vital signs reviewed and stable  Post vital signs: Reviewed and stable  Last Vitals:  Vitals Value Taken Time  BP 88/53 04/29/19 1110  Temp 36 C 04/29/19 1110  Pulse 77 04/29/19 1110  Resp 12 04/29/19 1110  SpO2 94 % 04/29/19 1110    Last Pain:  Vitals:   04/29/19 1110  TempSrc: Tympanic  PainSc:          Complications: No apparent anesthesia complications

## 2019-04-29 NOTE — Anesthesia Post-op Follow-up Note (Signed)
Anesthesia QCDR form completed.        

## 2019-05-02 ENCOUNTER — Encounter: Payer: Self-pay | Admitting: Gastroenterology

## 2019-05-02 NOTE — Anesthesia Postprocedure Evaluation (Signed)
Anesthesia Post Note  Patient: Nathaniel Curry  Procedure(s) Performed: ESOPHAGOGASTRODUODENOSCOPY (EGD) WITH PROPOFOL (N/A )  Patient location during evaluation: Endoscopy Anesthesia Type: General Level of consciousness: awake and alert and oriented Pain management: pain level controlled Vital Signs Assessment: post-procedure vital signs reviewed and stable Respiratory status: spontaneous breathing Cardiovascular status: blood pressure returned to baseline Anesthetic complications: no     Last Vitals:  Vitals:   04/29/19 1130 04/29/19 1140  BP: 114/70 116/79  Pulse: 79   Resp: 20   Temp:    SpO2: 97% 96%    Last Pain:  Vitals:   04/30/19 0940  TempSrc:   PainSc: 0-No pain                 Khylei Wilms

## 2019-05-04 LAB — SURGICAL PATHOLOGY

## 2019-05-09 ENCOUNTER — Other Ambulatory Visit: Payer: Self-pay | Admitting: Gastroenterology

## 2019-05-09 ENCOUNTER — Encounter: Payer: Self-pay | Admitting: Family Medicine

## 2019-05-09 DIAGNOSIS — K449 Diaphragmatic hernia without obstruction or gangrene: Secondary | ICD-10-CM

## 2019-05-09 DIAGNOSIS — K219 Gastro-esophageal reflux disease without esophagitis: Secondary | ICD-10-CM

## 2019-05-11 ENCOUNTER — Other Ambulatory Visit: Payer: Self-pay

## 2019-05-11 ENCOUNTER — Telehealth (INDEPENDENT_AMBULATORY_CARE_PROVIDER_SITE_OTHER): Payer: 59 | Admitting: Family Medicine

## 2019-05-11 ENCOUNTER — Encounter: Payer: Self-pay | Admitting: Family Medicine

## 2019-05-11 DIAGNOSIS — M5412 Radiculopathy, cervical region: Secondary | ICD-10-CM

## 2019-05-11 DIAGNOSIS — I10 Essential (primary) hypertension: Secondary | ICD-10-CM

## 2019-05-11 DIAGNOSIS — K2270 Barrett's esophagus without dysplasia: Secondary | ICD-10-CM

## 2019-05-11 DIAGNOSIS — F419 Anxiety disorder, unspecified: Secondary | ICD-10-CM | POA: Diagnosis not present

## 2019-05-11 DIAGNOSIS — F329 Major depressive disorder, single episode, unspecified: Secondary | ICD-10-CM | POA: Diagnosis not present

## 2019-05-11 DIAGNOSIS — F32A Depression, unspecified: Secondary | ICD-10-CM

## 2019-05-11 MED ORDER — MIRTAZAPINE 15 MG PO TABS: 15.0000 mg | ORAL_TABLET | Freq: Every day | ORAL | 3 refills | Status: DC

## 2019-05-11 NOTE — Patient Instructions (Signed)
Nice to see you. Please start on the remeron. If you develop drowsiness with this please let us know.  Please monitor your BP at home. If it worsens or does not improve please let us know.  If you develop thoughts of suicide please seek medical attention right away.

## 2019-05-15 NOTE — Assessment & Plan Note (Signed)
This has worsened based on recent EGD.  Discussed that at this point a Nissen fundoplication may be the best option though he would need to discuss with the surgeon.  Discussed that it is a relatively significant surgery.

## 2019-05-15 NOTE — Progress Notes (Signed)
Tommi Rumps, MD Phone: [phone]  Nathaniel Curry is a 38 y.o. male who presents today for follow-up.  Hypertension: Patient notes his blood pressure has been high recently given significant stressors and anxiety related to work.  He continues on olmesartan, amlodipine, carvedilol, and indapamide.  Blood pressure was previously well controlled on this regimen prior to the uptake in his anxiety.  No chest pain or shortness of breath.  He reports he had come off of his carvedilol due to low pulse rate though he restarted it when his blood pressure and pulse trended up with his anxiety.  Anxiety/depression: Patient tapered himself off of the Lexapro.  He noted he was having some suicidal thoughts when he was on it and he stayed on it for about 5 weeks though he tapered off and has had no additional suicidal thoughts.  He had no suicide attempt.  He does note some depression.  No current suicidal thoughts.  He has been sleeping a lot.  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  He has done pastoral counseling.  He has previously been on Prozac and Wellbutrin which helped some and Zoloft which did not help.  Currently on BuSpar which is not helping at all.  Neck/arm pain/radicular symptoms: Notes these resolved with prednisone taper.  He saw neurosurgery and they did not want to do surgery unless necessary and he has had no additional symptoms.  GERD: He is on Protonix.  He is back on Carafate.  His symptoms have gotten worse with his increase in anxiety.  He notes they are talking about doing a Nissen fundoplication.  Social History   Tobacco Use  Smoking Status Never Smoker  Smokeless Tobacco Never Used     ROS see history of present illness  Objective  Physical Exam Vitals:   05/11/19 1542  BP: (!) 160/100  Pulse: 95  Temp: 98.5 F (36.9 C)  SpO2: 96%    BP Readings from Last 3 Encounters:  05/11/19 (!) 160/100  04/29/19 116/79  12/28/18 130/88   Wt Readings from Last  3 Encounters:  05/11/19 (!) 337 lb 12.8 oz (153.2 kg)  04/29/19 (!) 340 lb (154.2 kg)  12/28/18 (!) 343 lb (155.6 kg)    Physical Exam Constitutional:      General: He is not in acute distress.    Appearance: He is not diaphoretic.  Cardiovascular:     Rate and Rhythm: Normal rate and regular rhythm.     Heart sounds: Normal heart sounds.  Pulmonary:     Effort: Pulmonary effort is normal.     Breath sounds: Normal breath sounds.  Musculoskeletal:     Right lower leg: No edema.     Left lower leg: No edema.  Skin:    General: Skin is warm and dry.  Neurological:     Mental Status: He is alert.      Assessment/Plan: Please see individual problem list.  Essential (primary) hypertension Elevated though I believe this is likely related to stress and anxiety given previous good control on current regimen.  He will continue with his current medication and we will focus on treating his anxiety.  Barrett esophagus This has worsened based on recent EGD.  Discussed that at this point a Nissen fundoplication may be the best option though he would need to discuss with the surgeon.  Discussed that it is a relatively significant surgery.  Cervical radiculopathy Resolved.  Monitor for recurrence.  Anxiety and depression This has  worsened.  His job has become quite stressful and is provoking many of his current symptoms.  He will be started on Remeron as this may help him sleep and help with depression and anxiety.  We will follow-up in about a month.   No orders of the defined types were placed in this encounter.   Meds ordered this encounter  Medications  . mirtazapine (REMERON) 15 MG tablet    Sig: Take 1 tablet (15 mg total) by mouth at bedtime.    Dispense:  30 tablet    Refill:  [place], MD [place]

## 2019-05-15 NOTE — Assessment & Plan Note (Signed)
This has worsened.  His job has become quite stressful and is provoking many of his current symptoms.  He will be started on Remeron as this may help him sleep and help with depression and anxiety.  We will follow-up in about a month.

## 2019-05-15 NOTE — Assessment & Plan Note (Signed)
Resolved. Monitor for recurrence. 

## 2019-05-15 NOTE — Assessment & Plan Note (Signed)
Elevated though I believe this is likely related to stress and anxiety given previous good control on current regimen.  He will continue with his current medication and we will focus on treating his anxiety.

## 2019-05-23 ENCOUNTER — Encounter: Payer: Self-pay | Admitting: Family Medicine

## 2019-05-23 DIAGNOSIS — M79672 Pain in left foot: Secondary | ICD-10-CM

## 2019-05-23 DIAGNOSIS — M7662 Achilles tendinitis, left leg: Secondary | ICD-10-CM | POA: Diagnosis not present

## 2019-05-25 DIAGNOSIS — R1013 Epigastric pain: Secondary | ICD-10-CM | POA: Diagnosis not present

## 2019-05-25 DIAGNOSIS — K210 Gastro-esophageal reflux disease with esophagitis: Secondary | ICD-10-CM | POA: Diagnosis not present

## 2019-05-25 DIAGNOSIS — M7662 Achilles tendinitis, left leg: Secondary | ICD-10-CM | POA: Diagnosis not present

## 2019-05-30 ENCOUNTER — Ambulatory Visit
Admission: RE | Admit: 2019-05-30 | Discharge: 2019-05-30 | Disposition: A | Discharge status: Home / Self Care | Payer: 59 | Source: Ambulatory Visit | Attending: Gastroenterology | Admitting: Gastroenterology

## 2019-05-30 ENCOUNTER — Other Ambulatory Visit: Payer: Self-pay

## 2019-05-30 DIAGNOSIS — K219 Gastro-esophageal reflux disease without esophagitis: Secondary | ICD-10-CM | POA: Insufficient documentation

## 2019-05-30 DIAGNOSIS — M7662 Achilles tendinitis, left leg: Secondary | ICD-10-CM | POA: Diagnosis not present

## 2019-05-30 DIAGNOSIS — K449 Diaphragmatic hernia without obstruction or gangrene: Secondary | ICD-10-CM | POA: Diagnosis not present

## 2019-06-03 ENCOUNTER — Ambulatory Visit: Payer: 59

## 2019-06-08 ENCOUNTER — Other Ambulatory Visit
Admission: RE | Admit: 2019-06-08 | Discharge: 2019-06-08 | Disposition: A | Discharge status: Home / Self Care | Payer: 59 | Source: Ambulatory Visit | Attending: Gastroenterology | Admitting: Gastroenterology

## 2019-06-08 ENCOUNTER — Other Ambulatory Visit: Payer: Self-pay

## 2019-06-08 ENCOUNTER — Ambulatory Visit
Admission: RE | Admit: 2019-06-08 | Discharge: 2019-06-08 | Disposition: A | Discharge status: Home / Self Care | Payer: 59 | Source: Ambulatory Visit | Attending: Gastroenterology | Admitting: Gastroenterology

## 2019-06-08 DIAGNOSIS — K449 Diaphragmatic hernia without obstruction or gangrene: Secondary | ICD-10-CM | POA: Diagnosis not present

## 2019-06-08 DIAGNOSIS — K219 Gastro-esophageal reflux disease without esophagitis: Secondary | ICD-10-CM | POA: Insufficient documentation

## 2019-06-08 DIAGNOSIS — K5730 Diverticulosis of large intestine without perforation or abscess without bleeding: Secondary | ICD-10-CM | POA: Diagnosis not present

## 2019-06-08 DIAGNOSIS — Z01818 Encounter for other preprocedural examination: Secondary | ICD-10-CM | POA: Diagnosis not present

## 2019-06-08 DIAGNOSIS — N200 Calculus of kidney: Secondary | ICD-10-CM | POA: Diagnosis not present

## 2019-06-08 DIAGNOSIS — K7689 Other specified diseases of liver: Secondary | ICD-10-CM | POA: Diagnosis not present

## 2019-06-08 LAB — CREATININE, SERUM
Creatinine, Ser: 0.94 mg/dL (ref 0.61–1.24)
GFR calc Af Amer: >60 mL/min (ref >60)
GFR calc non Af Amer: >60 mL/min (ref >60)

## 2019-06-08 MED ORDER — IOHEXOL 300 MG/ML  SOLN
150.0000 mL | Freq: Once | INTRAMUSCULAR | Status: AC | PRN
  Administered 2019-06-08: 125 mL via INTRAVENOUS

## 2019-06-10 ENCOUNTER — Ambulatory Visit: Payer: 59 | Admitting: Family Medicine

## 2019-06-13 ENCOUNTER — Encounter: Payer: Self-pay | Admitting: Family Medicine

## 2019-06-13 ENCOUNTER — Other Ambulatory Visit: Payer: Self-pay

## 2019-06-13 ENCOUNTER — Ambulatory Visit: Payer: 59 | Admitting: Family Medicine

## 2019-06-13 DIAGNOSIS — I10 Essential (primary) hypertension: Secondary | ICD-10-CM | POA: Diagnosis not present

## 2019-06-13 DIAGNOSIS — F419 Anxiety disorder, unspecified: Secondary | ICD-10-CM

## 2019-06-13 DIAGNOSIS — F329 Major depressive disorder, single episode, unspecified: Secondary | ICD-10-CM

## 2019-06-13 DIAGNOSIS — F32A Depression, unspecified: Secondary | ICD-10-CM

## 2019-06-13 MED ORDER — HYDROXYZINE PAMOATE 25 MG PO CAPS: 25.0000 mg | ORAL_CAPSULE | Freq: Every evening | ORAL | 1 refills | Status: DC | PRN

## 2019-06-13 MED ORDER — VORTIOXETINE HBR 5 MG PO TABS: 5.0000 mg | ORAL_TABLET | Freq: Every day | ORAL | 3 refills | Status: DC

## 2019-06-13 NOTE — Assessment & Plan Note (Signed)
Controlled today though has been elevated at home.  No anxiety and stress of his work situation is driving his elevated blood pressure.  He will continue with his current regimen we will try to treat his anxiety and depression.  He will continue to monitor and if his blood pressure trends up he will let us know.

## 2019-06-13 NOTE — Patient Instructions (Signed)
Nice to see you. Please continue your current blood pressure medications. We will try Trintellix to treat your depression.  There may be some anxiety benefit.  If this is not beneficial we will consider psychiatry referral. If you develop suicidal thoughts please seek medical attention immediately.

## 2019-06-13 NOTE — Progress Notes (Signed)
Nathaniel Rumps, MD Phone: [phone]  Nathaniel Curry is a 38 y.o. male who presents today for follow-up.  Hypertension: Has been low 150s over low 90s.  He is taking amlodipine, olmesartan, carvedilol, and indapamide.  No chest pain, shortness breath, or edema.  Anxiety and stress levels have been driving elevated blood pressure.  Anxiety/depression: Patient notes these things are unchanged on the Remeron.  He notes he is possibly more irritable than he was previously.  He is sleeping more on his days off.  He is still having some panic attacks at night.  No SI.  He has taken BuSpar and Zoloft with little to no benefit.  Lexapro led to suicidal thoughts.  He has been on Wellbutrin and Prozac in the past with those eventually losing efficacy.  His work situation is driving a lot of his anxiety, stress, and depression.  Social History   Tobacco Use  Smoking Status Never Smoker  Smokeless Tobacco Never Used     ROS see history of present illness  Objective  Physical Exam Vitals:   06/13/19 1040  BP: 130/80  Pulse: 65  Temp: 97.6 F (36.4 C)  SpO2: 98%    BP Readings from Last 3 Encounters:  06/13/19 130/80  05/11/19 (!) 160/100  04/29/19 116/79   Wt Readings from Last 3 Encounters:  06/13/19 (!) 336 lb 6.4 oz (152.6 kg)  05/11/19 (!) 337 lb 12.8 oz (153.2 kg)  04/29/19 (!) 340 lb (154.2 kg)    Physical Exam Constitutional:      General: He is not in acute distress.    Appearance: He is not diaphoretic.  Cardiovascular:     Rate and Rhythm: Normal rate and regular rhythm.     Heart sounds: Normal heart sounds.  Pulmonary:     Effort: Pulmonary effort is normal.     Breath sounds: Normal breath sounds.  Skin:    General: Skin is dry.  Neurological:     Mental Status: He is alert.      Assessment/Plan: Please see individual problem list.  Essential (primary) hypertension Controlled today though has been elevated at home.  No anxiety and stress of his work  situation is driving his elevated blood pressure.  He will continue with his current regimen we will try to treat his anxiety and depression.  He will continue to monitor and if his blood pressure trends up he will let us know.  Anxiety and depression This continues to be an issue.  He is sleeping an excessive amount on days where he does not have to work with the Remeron.  He tried multiple SSRIs as well as alternative treatments for anxiety and depression with minimal to no benefit.  SNRIs are not a great option given his blood pressure issues.  We will trial Trintellix to see if that provides benefit for depression.  It appears there have been mixed results in studies for anxiety.  Will start on hydroxyzine 1 tablet at night for anxiety and panic attacks.  Discussed monitoring for drowsiness with this.  Discussed reasons to seek medical attention for suicidal ideation.  Discussed that if the Trintellix is not beneficial we may need to consider psychiatry referral.   No orders of the defined types were placed in this encounter.   Meds ordered this encounter  Medications  . vortioxetine HBr (TRINTELLIX) 5 MG TABS tablet    Sig: Take 1 tablet (5 mg total) by mouth daily.    Dispense:  30 tablet  Refill:  3  . hydrOXYzine (VISTARIL) 25 MG capsule    Sig: Take 1 capsule (25 mg total) by mouth at bedtime as needed for anxiety.    Dispense:  30 capsule    Refill:  [place], MD [place]

## 2019-06-13 NOTE — Assessment & Plan Note (Signed)
This continues to be an issue.  He is sleeping an excessive amount on days where he does not have to work with the Remeron.  He tried multiple SSRIs as well as alternative treatments for anxiety and depression with minimal to no benefit.  SNRIs are not a great option given his blood pressure issues.  We will trial Trintellix to see if that provides benefit for depression.  It appears there have been mixed results in studies for anxiety.  Will start on hydroxyzine 1 tablet at night for anxiety and panic attacks.  Discussed monitoring for drowsiness with this.  Discussed reasons to seek medical attention for suicidal ideation.  Discussed that if the Trintellix is not beneficial we may need to consider psychiatry referral.

## 2019-06-27 ENCOUNTER — Encounter: Payer: Self-pay | Admitting: Family Medicine

## 2019-06-27 DIAGNOSIS — F32A Depression, unspecified: Secondary | ICD-10-CM

## 2019-06-27 DIAGNOSIS — F329 Major depressive disorder, single episode, unspecified: Secondary | ICD-10-CM

## 2019-06-27 DIAGNOSIS — F419 Anxiety disorder, unspecified: Secondary | ICD-10-CM

## 2019-07-05 ENCOUNTER — Encounter: Payer: Self-pay | Admitting: Family Medicine

## 2019-07-06 ENCOUNTER — Telehealth: Payer: Self-pay

## 2019-07-06 NOTE — Telephone Encounter (Signed)
Copied from [place] [id number]. Topic: General - Other >> Jul 06, 2019  4:04 PM Yvette Rack wrote: Reason for CRM: Pt stated he needs to speak with Dr. Caryl Bis nurse regarding a care issue. Pt declined to provide more details. Pt requests call back.

## 2019-07-07 ENCOUNTER — Encounter: Payer: Self-pay | Admitting: Family Medicine

## 2019-07-07 MED ORDER — VORTIOXETINE HBR 10 MG PO TABS: 10.0000 mg | ORAL_TABLET | Freq: Every day | ORAL | 2 refills | Status: DC

## 2019-07-08 DIAGNOSIS — Z0000 Encounter for general adult medical examination without abnormal findings: Secondary | ICD-10-CM | POA: Diagnosis not present

## 2019-07-12 NOTE — Telephone Encounter (Signed)
I called the patient to see what his care issue is and left a message to call back. Kendrick Remigio,cma

## 2019-07-12 NOTE — Telephone Encounter (Signed)
lmtcb to see what the patient wanted to discuss abou ta care issue.  Chelbie Jarnagin,cma

## 2019-07-13 ENCOUNTER — Ambulatory Visit: Payer: 59 | Admitting: Family Medicine

## 2019-07-21 NOTE — Telephone Encounter (Signed)
Could not reach patient.  Nathaniel Curry,cma

## 2019-07-25 DIAGNOSIS — Z20828 Contact with and (suspected) exposure to other viral communicable diseases: Secondary | ICD-10-CM | POA: Diagnosis not present

## 2019-07-25 DIAGNOSIS — I10 Essential (primary) hypertension: Secondary | ICD-10-CM | POA: Diagnosis not present

## 2019-10-17 DIAGNOSIS — U071 COVID-19: Secondary | ICD-10-CM | POA: Diagnosis not present

## 2019-10-17 DIAGNOSIS — J208 Acute bronchitis due to other specified organisms: Secondary | ICD-10-CM | POA: Diagnosis not present

## 2019-10-19 DIAGNOSIS — U071 COVID-19: Secondary | ICD-10-CM | POA: Diagnosis not present

## 2019-10-19 DIAGNOSIS — J208 Acute bronchitis due to other specified organisms: Secondary | ICD-10-CM | POA: Diagnosis not present

## 2019-10-26 DIAGNOSIS — R197 Diarrhea, unspecified: Secondary | ICD-10-CM | POA: Diagnosis not present

## 2019-10-26 DIAGNOSIS — E860 Dehydration: Secondary | ICD-10-CM | POA: Diagnosis not present

## 2019-10-26 DIAGNOSIS — U071 COVID-19: Secondary | ICD-10-CM | POA: Diagnosis not present

## 2019-10-26 DIAGNOSIS — R112 Nausea with vomiting, unspecified: Secondary | ICD-10-CM | POA: Diagnosis not present

## 2019-11-24 ENCOUNTER — Other Ambulatory Visit: Payer: Self-pay | Admitting: Internal Medicine

## 2020-01-24 DIAGNOSIS — I10 Essential (primary) hypertension: Secondary | ICD-10-CM | POA: Diagnosis not present

## 2020-01-24 DIAGNOSIS — Z0000 Encounter for general adult medical examination without abnormal findings: Secondary | ICD-10-CM | POA: Diagnosis not present

## 2020-01-24 DIAGNOSIS — K219 Gastro-esophageal reflux disease without esophagitis: Secondary | ICD-10-CM | POA: Diagnosis not present

## 2020-06-21 ENCOUNTER — Other Ambulatory Visit: Payer: Self-pay | Admitting: Internal Medicine

## 2020-07-20 DIAGNOSIS — Z131 Encounter for screening for diabetes mellitus: Secondary | ICD-10-CM | POA: Diagnosis not present

## 2020-07-20 DIAGNOSIS — Z13220 Encounter for screening for lipoid disorders: Secondary | ICD-10-CM | POA: Diagnosis not present

## 2020-07-20 DIAGNOSIS — Z0000 Encounter for general adult medical examination without abnormal findings: Secondary | ICD-10-CM | POA: Diagnosis not present

## 2020-07-20 DIAGNOSIS — Z1389 Encounter for screening for other disorder: Secondary | ICD-10-CM | POA: Diagnosis not present

## 2020-07-27 ENCOUNTER — Other Ambulatory Visit: Payer: Self-pay | Admitting: Internal Medicine

## 2020-07-27 DIAGNOSIS — Z0000 Encounter for general adult medical examination without abnormal findings: Secondary | ICD-10-CM | POA: Diagnosis not present

## 2020-08-09 DIAGNOSIS — K449 Diaphragmatic hernia without obstruction or gangrene: Secondary | ICD-10-CM | POA: Diagnosis not present

## 2020-08-09 DIAGNOSIS — Z9884 Bariatric surgery status: Secondary | ICD-10-CM | POA: Diagnosis not present

## 2020-08-09 DIAGNOSIS — K219 Gastro-esophageal reflux disease without esophagitis: Secondary | ICD-10-CM | POA: Diagnosis not present

## 2020-08-09 DIAGNOSIS — I10 Essential (primary) hypertension: Secondary | ICD-10-CM | POA: Diagnosis not present

## 2020-08-10 ENCOUNTER — Other Ambulatory Visit (HOSPITAL_COMMUNITY): Payer: Self-pay | Admitting: General Surgery

## 2020-08-10 ENCOUNTER — Other Ambulatory Visit: Payer: Self-pay | Admitting: General Surgery

## 2020-08-10 DIAGNOSIS — Z9884 Bariatric surgery status: Secondary | ICD-10-CM

## 2020-08-22 ENCOUNTER — Encounter: Payer: Self-pay | Admitting: Dietician

## 2020-08-22 ENCOUNTER — Encounter: Payer: 59 | Attending: General Surgery | Admitting: Dietician

## 2020-08-22 ENCOUNTER — Other Ambulatory Visit: Payer: Self-pay

## 2020-08-22 VITALS — Ht 69.0 in | Wt 344.4 lb

## 2020-08-22 DIAGNOSIS — K219 Gastro-esophageal reflux disease without esophagitis: Secondary | ICD-10-CM | POA: Diagnosis not present

## 2020-08-22 DIAGNOSIS — Z9884 Bariatric surgery status: Secondary | ICD-10-CM | POA: Diagnosis not present

## 2020-08-22 DIAGNOSIS — I10 Essential (primary) hypertension: Secondary | ICD-10-CM | POA: Diagnosis not present

## 2020-08-22 DIAGNOSIS — Z6841 Body Mass Index (BMI) 40.0 and over, adult: Secondary | ICD-10-CM | POA: Diagnosis not present

## 2020-08-22 NOTE — Patient Instructions (Signed)
   Work on reducing sodas and sweet tea in favor of more water and tea with sugar free sweetener.   Eat at regular intervals during the day, every 3-5 hours.   Reduce restaurant meals in favor of simple balanced meals at home.

## 2020-08-22 NOTE — Progress Notes (Signed)
Nutrition Assessment for Bariatric Surgery   Appointment start time: 0910   end time: 1010  Planned Surgery: revision of sleeve surgery to RYGB  Anthropometrics: Weight: 344.4lbs Height: 5'9" BMI: 50.86   Patient's Goal Weight: 200lbs  Clinical: Medical History: GERD, Barrett's Esophagus, diverticulosis, HTN, gout Medications and Supplements: ALPRAZolam, amlodipine-olmesartan, famotidine, hydrOXYzine, indapamide, metoprolol, pantoprazole, sertraline, sucralfate Relevant labs:  Notable symptoms: GERD symptoms Drug allergies: none known Food allergies: none known  Lifestyle and Dietary History:   Works as NP, 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  Patient lives alone, currently most meals are restaurant takeout meals.  Dieting/ weight history:   Patient underwent sleeve gastrectomy 04/2013, lost down to about 270lbs, but had severe GERD symptoms, and regained weight particularly since onset of pandemic which also involved longer work hours and subsequent increase in anxiety. He reverted from bariatric diet to more takeout food and sugar-sweetened beverages.   He reports having had no nutrition visits (through a different bariatric clinic) after his previous surgery, despite effort on his part to do so.   Disordered or emotional eating history: some stress eating or boredom eating (weekends). No eating disorder.   Physical activity: no structured activity at this time  Dietary Recall:  Daily pattern: 1-2 meals and 0-1 snacks. Dining out: 7-10 meals per week. Breakfast: usually none; occasionally restaurant biscuit  Snack: protein bar with almonds Lunch: usually none; occasionally [facility] cafeteria meal-- meat and veg.  Snack: none or same as am Supper: quick take-out meal ie sandwich and fries, [place], [place], etc.  Snack: none Beverages: sodas, sweet tea, zero sugar vitamin water   Nutrition Intervention:  Instructed  patient on pre-op diet goals and importance of close adherence to bariatric diet after surgery to avoid side effects and complications.   Discussed stages of the bariatric diet after surgery as well as the importance of adequate protein and fluid intake. Discussed options for tracking intake.  Discussed importance of vitamin supplements and options.   Provided overview of 2-week pre-op diet.   Nutrition Diagnosis: [place]-3.3 Overweight/ obesity related to history of excess calories and inadequate physical activity as evidenced by patient with current BMI of 50.9, resuming healthy eating pattern for weight loss and improvement of GI symptoms prior to bariatric surgery.  Teaching method(s) utilized: Copy provided: . Pre-op Goals . Diet Stage Template   Learning Readiness: Change in progress  Barriers to learning/ implementing lifestyle change: none  Demonstrated degree of understanding via: Teach Back  Summary:  Patient has begun making diet and lifestyle changes in effort to lose weight, improve GI symptoms, and prepare for bariatric surgery.   Patient's goal for weight loss is improved health, particularly resolution of GERD.  He has solid support from friends, family, and pastor(s).   He agrees to work on reducing/ eliminating sugar-sweetened beverages, eating at regular intervals, and reducing restaurant meals prior to surgery.   He voices understanding of, and motivation to follow the bariatric diet after surgery.   From a nutrition standpoint, he is ready to proceed with the bariatric surgery program.    Plan:  Patient commits to returning for post-op RD visits beginning 2 weeks after surgery.

## 2020-09-04 ENCOUNTER — Ambulatory Visit
Admission: RE | Admit: 2020-09-04 | Discharge: 2020-09-04 | Disposition: A | Discharge status: Home / Self Care | Payer: 59 | Source: Home / Self Care | Attending: General Surgery | Admitting: General Surgery

## 2020-09-04 ENCOUNTER — Ambulatory Visit
Admission: RE | Admit: 2020-09-04 | Discharge: 2020-09-04 | Disposition: A | Discharge status: Home / Self Care | Payer: 59 | Source: Ambulatory Visit | Attending: General Surgery | Admitting: General Surgery

## 2020-09-04 ENCOUNTER — Other Ambulatory Visit: Payer: Self-pay

## 2020-09-04 DIAGNOSIS — Z01818 Encounter for other preprocedural examination: Secondary | ICD-10-CM | POA: Diagnosis not present

## 2020-09-04 DIAGNOSIS — Z9884 Bariatric surgery status: Secondary | ICD-10-CM | POA: Insufficient documentation

## 2020-09-04 DIAGNOSIS — I10 Essential (primary) hypertension: Secondary | ICD-10-CM | POA: Diagnosis not present

## 2020-10-26 ENCOUNTER — Other Ambulatory Visit: Payer: Self-pay | Admitting: Family Medicine

## 2020-11-19 ENCOUNTER — Encounter: Payer: 59 | Attending: General Surgery | Admitting: Skilled Nursing Facility1

## 2020-11-19 ENCOUNTER — Other Ambulatory Visit: Payer: Self-pay

## 2020-11-20 NOTE — Progress Notes (Signed)
Pre-Operative Nutrition Class:  Appt start time: 2951   End time:  1830.  Patient was seen on 11/19/2020 for Pre-Operative Bariatric Surgery Education [facility]    Surgery date:  Surgery type: conversion sleeve to RYGB Start weight at [facility]: 344.4 Weight today: 352.2   The following the learning objectives were met by the patient during this course:  Identify Pre-Op Dietary Goals and will begin 2 weeks pre-operatively  Identify appropriate sources of fluids and proteins   State protein recommendations and appropriate sources pre and post-operatively  Identify Post-Operative Dietary Goals and will follow for 2 weeks post-operatively  Identify appropriate multivitamin and calcium sources  Describe the need for physical activity post-operatively and will follow MD recommendations  State when to call healthcare provider regarding medication questions or post-operative complications  Handouts given during class include:  Pre-Op Bariatric Surgery Diet Handout  Protein Shake Handout  Post-Op Bariatric Surgery Nutrition Handout  [facility] Program Information Flyer  Support Group Information Flyer  [facility] Bariatric Supplements Price List  Follow-Up Plan: Patient will follow-up at [facility] 2 weeks post operatively for diet advancement per MD.

## 2020-12-05 ENCOUNTER — Ambulatory Visit: Payer: Self-pay | Admitting: General Surgery

## 2020-12-05 NOTE — Progress Notes (Signed)
DUE TO COVID-19 ONLY ONE VISITOR IS ALLOWED TO COME WITH YOU AND STAY IN THE WAITING ROOM ONLY DURING PRE OP AND PROCEDURE DAY OF SURGERY. THE 1 VISITOR  MAY VISIT WITH YOU AFTER SURGERY IN YOUR PRIVATE ROOM DURING VISITING HOURS ONLY!  YOU NEED TO HAVE A COVID 19 TEST ON__2/24/2022 _____ @__310pm_____ , THIS TEST MUST BE DONE BEFORE SURGERY,  COVID TESTING SITE [address] [place] [address], IT IS ON THE RIGHT GOING OUT [address] APPROXIMATELY  2 MINUTES PAST [employer] ON THE RIGHT. ONCE YOUR COVID TEST IS COMPLETED,  PLEASE BEGIN THE QUARANTINE INSTRUCTIONS AS OUTLINED IN YOUR HANDOUT.                Nathaniel Curry  12/05/2020   Your procedure is scheduled on: 11/21/2020    Report to [facility] Main  Entrance   Report to admitting at   0530 AM     Call this number if you have problems the morning of surgery [phone]    REMEMBER: NO  SOLID FOOD CANDY OR GUM AFTER MIDNIGHT. CLEAR LIQUIDS UNTIL  0430am        . NOTHING BY MOUTH EXCEPT CLEAR LIQUIDS UNTIL    . PLEASE FINISH ENSURE DRINK PER SURGEON ORDER  WHICH NEEDS TO BE COMPLETED AT  0430am     .      CLEAR LIQUID DIET   Foods Allowed                                                                    Coffee and tea, regular and decaf                            Fruit ices (not with fruit pulp)                                      Iced Popsicles                                    Carbonated beverages, regular and diet                                    Cranberry, grape and apple juices Sports drinks like Gatorade Lightly seasoned clear broth or consume(fat free) Sugar, honey syrup ___________________________________________________________________      BRUSH YOUR TEETH MORNING OF SURGERY AND RINSE YOUR MOUTH OUT, NO CHEWING GUM CANDY OR MINTS.     Take these medicines the morning of surgery with A SIP OF WATER: toprol. Protonix, zoloft   DO NOT TAKE ANY DIABETIC MEDICATIONS DAY OF YOUR  SURGERY                               You may not have any metal on your body including hair pins and              piercings  Do  not wear jewelry, make-up, lotions, powders or perfumes, deodorant             Do not wear nail polish on your fingernails.  Do not shave  48 hours prior to surgery.              Men may shave face and neck.   Do not bring valuables to the hospital. [place].  Contacts, dentures or bridgework may not be worn into surgery.  Leave suitcase in the car. After surgery it may be brought to your room.     Patients discharged the day of surgery will not be allowed to drive home. IF YOU ARE HAVING SURGERY AND GOING HOME THE SAME DAY, YOU MUST HAVE AN ADULT TO DRIVE YOU HOME AND BE WITH YOU FOR 24 HOURS. YOU MAY GO HOME BY TAXI OR UBER OR ORTHERWISE, BUT AN ADULT MUST ACCOMPANY YOU HOME AND STAY WITH YOU FOR 24 HOURS.  Name and phone number of your driver:  Special Instructions: N/A              Please read over the following fact sheets you were given: _____________________________________________________________________  [facility] - Preparing for Surgery Before surgery, you can play an important role.  Because skin is not sterile, your skin needs to be as free of germs as possible.  You can reduce the number of germs on your skin by washing with CHG (chlorahexidine gluconate) soap before surgery.  CHG is an antiseptic cleaner which kills germs and bonds with the skin to continue killing germs even after washing. Please DO NOT use if you have an allergy to CHG or antibacterial soaps.  If your skin becomes reddened/irritated stop using the CHG and inform your nurse when you arrive at Short Stay. Do not shave (including legs and underarms) for at least 48 hours prior to the first CHG shower.  You may shave your face/neck. Please follow these instructions carefully:  1.  Shower with CHG Soap the night before surgery and the   morning of Surgery.  2.  If you choose to wash your hair, wash your hair first as usual with your  normal  shampoo.  3.  After you shampoo, rinse your hair and body thoroughly to remove the  shampoo.                           4.  Use CHG as you would any other liquid soap.  You can apply chg directly  to the skin and wash                       Gently with a scrungie or clean washcloth.  5.  Apply the CHG Soap to your body ONLY FROM THE NECK DOWN.   Do not use on face/ open                           Wound or open sores. Avoid contact with eyes, ears mouth and genitals (private parts).                       Wash face,  Genitals (private parts) with your normal soap.             6.  Wash thoroughly,  paying special attention to the area where your surgery  will be performed.  7.  Thoroughly rinse your body with warm water from the neck down.  8.  DO NOT shower/wash with your normal soap after using and rinsing off  the CHG Soap.                9.  Pat yourself dry with a clean towel.            10.  Wear clean pajamas.            11.  Place clean sheets on your bed the night of your first shower and do not  sleep with pets. Day of Surgery : Do not apply any lotions/deodorants the morning of surgery.  Please wear clean clothes to the hospital/surgery center.  FAILURE TO FOLLOW THESE INSTRUCTIONS MAY RESULT IN THE CANCELLATION OF YOUR SURGERY PATIENT SIGNATURE_________________________________  NURSE SIGNATURE__________________________________  ________________________________________________________________________

## 2020-12-07 ENCOUNTER — Encounter (HOSPITAL_COMMUNITY)
Admission: RE | Admit: 2020-12-07 | Discharge: 2020-12-07 | Disposition: A | Discharge status: Home / Self Care | Payer: 59 | Source: Ambulatory Visit | Attending: General Surgery | Admitting: General Surgery

## 2020-12-07 ENCOUNTER — Ambulatory Visit: Payer: Self-pay | Admitting: General Surgery

## 2020-12-07 ENCOUNTER — Encounter (HOSPITAL_COMMUNITY): Payer: Self-pay

## 2020-12-07 ENCOUNTER — Other Ambulatory Visit: Payer: Self-pay

## 2020-12-07 DIAGNOSIS — R7303 Prediabetes: Secondary | ICD-10-CM | POA: Diagnosis not present

## 2020-12-07 DIAGNOSIS — Z01812 Encounter for preprocedural laboratory examination: Secondary | ICD-10-CM | POA: Diagnosis not present

## 2020-12-07 DIAGNOSIS — Z8739 Personal history of other diseases of the musculoskeletal system and connective tissue: Secondary | ICD-10-CM | POA: Diagnosis not present

## 2020-12-07 DIAGNOSIS — Z9884 Bariatric surgery status: Secondary | ICD-10-CM | POA: Diagnosis not present

## 2020-12-07 DIAGNOSIS — K219 Gastro-esophageal reflux disease without esophagitis: Secondary | ICD-10-CM | POA: Diagnosis not present

## 2020-12-07 DIAGNOSIS — I10 Essential (primary) hypertension: Secondary | ICD-10-CM | POA: Diagnosis not present

## 2020-12-07 DIAGNOSIS — K449 Diaphragmatic hernia without obstruction or gangrene: Secondary | ICD-10-CM | POA: Diagnosis not present

## 2020-12-07 HISTORY — DX: Anxiety disorder, unspecified: F41.9

## 2020-12-07 LAB — CBC WITH DIFFERENTIAL/PLATELET
Abs Immature Granulocytes: 0.04 10*3/uL (ref 0.00–0.07)
Basophils Absolute: 0.1 10*3/uL (ref 0.0–0.1)
Basophils Relative: 1 %
Eosinophils Absolute: 0.4 10*3/uL (ref 0.0–0.5)
Eosinophils Relative: 4 %
HCT: 46.3 % (ref 39.0–52.0)
Hemoglobin: 14.4 g/dL (ref 13.0–17.0)
Immature Granulocytes: 0 %
Lymphocytes Relative: 28 %
Lymphs Abs: 2.8 10*3/uL (ref 0.7–4.0)
MCH: 24.8 pg — ABNORMAL LOW (ref 26.0–34.0)
MCHC: 31.1 g/dL (ref 30.0–36.0)
MCV: 79.7 fL — ABNORMAL LOW (ref 80.0–100.0)
Monocytes Absolute: 0.9 10*3/uL (ref 0.1–1.0)
Monocytes Relative: 9 %
Neutro Abs: 5.9 10*3/uL (ref 1.7–7.7)
Neutrophils Relative %: 58 %
Platelets: 426 10*3/uL — ABNORMAL HIGH (ref 150–400)
RBC: 5.81 MIL/uL (ref 4.22–5.81)
RDW: 14.6 % (ref 11.5–15.5)
WBC: 10.1 10*3/uL (ref 4.0–10.5)
nRBC: 0 % (ref 0.0–0.2)

## 2020-12-07 LAB — COMPREHENSIVE METABOLIC PANEL
ALT: 23 U/L (ref 0–44)
AST: 25 U/L (ref 15–41)
Albumin: 4.3 g/dL (ref 3.5–5.0)
Alkaline Phosphatase: 59 U/L (ref 38–126)
Anion gap: 12 (ref 5–15)
BUN: 22 mg/dL — ABNORMAL HIGH (ref 6–20)
CO2: 25 mmol/L (ref 22–32)
Calcium: 9.6 mg/dL (ref 8.9–10.3)
Chloride: 102 mmol/L (ref 98–111)
Creatinine, Ser: 1.28 mg/dL — ABNORMAL HIGH (ref 0.61–1.24)
GFR, Estimated: >60 mL/min (ref >60)
Glucose, Bld: 105 mg/dL — ABNORMAL HIGH (ref 70–99)
Potassium: 4.2 mmol/L (ref 3.5–5.1)
Sodium: 139 mmol/L (ref 135–145)
Total Bilirubin: 1 mg/dL (ref 0.3–1.2)
Total Protein: 8.3 g/dL — ABNORMAL HIGH (ref 6.5–8.1)

## 2020-12-07 NOTE — H&P (View-Only) (Signed)
Nathaniel Curry Appointment: 12/07/2020 8:45 AM Location: [facility] Patient #: [id number] DOB: 12/14/1980 Single / Language: Cleophus Molt / Race: White Male  History of Present Illness Nathaniel Hiss M. Wilson MD; 12/07/2020 8:56 AM) The patient is a 40 year old male who presents for a bariatric surgery evaluation. He comes in for follow-up regarding his reflux, hiatal hernia and severe obesity. He underwent laparoscopic sleeve gastrectomy in 2014 at outside hospital. He has completed our bariatric surgery revisional pathway. He has received psychological nutritional clearance. He denies any medical changes since I saw him in October. He denies any trips to the emergency room. He denies any chest pain, shortness of breath, dyspnea on exertion, orthopnea, angina. He denies any tobacco. He is still having daily reflux. He may have a rare globus sensation. He has a history of gout and kidney stones as well. He had labs back in October 2021 at his PCPs office which I reviewed in care everywhere. Prediabetes with an A1c of 5.7. Total cholesterol 200. Normal metabolic panel, normal CBC, normal TSH. Normal chest x-ray. Normal EKG   08/09/20 He comes in today to discuss bariatric surgery specifically revisional surgery. His primary care doctor is Dr. Emily Filbert at the [facility] clinic. He completed our seminar on line. He underwent laparoscopic sleeve gastrectomy in 2014 by Dr. Darnell Level at [facility]. He states that he was told he had a small hiatal hernia preoperatively but it was not repaired during surgery.  His comorbidities include hypertension, refractory GERD, hiatal hernia  He denies any chest pain, chest pressure, shortness of breath, orthopnea, nocturnal dyspnea, dyspnea on exertion, TIAs or amaurosis fugax. He denies any peripheral edema. He denies any personal or family history of blood clots. He had a remote sleep study test which was negative. He has sleep apnea questionnaire was low  risk.   He has daily reflux and heartburn. He is taking Protonix twice a day, Carafate 4 times a day, and cimetidine at night and is still having breakthrough symptoms. He states he only had controlled with dexilant that insurance would not cover it. He describes a sensation of burning in his upper abdomen, and intense pressure in his epigastric area. He will get short of breath at night due to coughing. He will occasionally have to come up at night if he eats late. He denies any pain with swallowing. He has had a laparoscopic cholecystectomy. He has had an extensive workup for his reflux. He was in consideration of going to [facility] as well as [facility] and completed some of their requirements for revisional surgery. He has undergone several endoscopies. The most recent one was last summer which I reviewed with results below. He also had a CT abdomen and pelvis in August 2020 which I personally reviewed. He has a moderate hiatal hernia. He also had an upper GI in August 2020 which showed moderate hiatal hernia, gastroesophageal reflux disease, and questionable ulcer near the GE junction.  He denies any dysuria or hematuria or melena or hematochezia. He denies any joint pain. He is on medication to prevent kidney stone formation. He denies any blurry vision.  He denies any tobacco, alcohol or drug use. He has a FNP with .  egd 04/2019 LA Grade C (one or more mucosal breaks continuous between tops of 2 or more mucosal folds, less than 75% circumference) esophagitis with no bleeding was found. Findings: There were esophageal mucosal changes consistent with short-segment Barrett's esophagus present in the lower third of the esophagus.  The maximum longitudinal extent of these mucosal changes was 2 cm in length. Mucosa was biopsied with a cold forceps for histology in 4 quadrants at intervals of 2 cm at 32 and 34 cm from the incisors. A total of 2 specimen bottles were sent to  pathology. Evidence of a sleeve gastrectomy was found in the gastric body. This was characterized by healthy appearing mucosa. However, there is an area of patulous gastric wasll in the region of the sliding hiatal hernia that will protrude up and down through the GE junction. With possible impairmant of drainage in that area. Patchy mild inflammation characterized by congestion (edema) and erosions was found in the gastric antrum and in the prepyloric region of the stomach. Biopsies were taken with a cold forceps for histology. Gastric biopsies also taken from the body of the stomach as well. The examined duodenum was normal. -   Problem List/Past Medical Nathaniel Hiss M. [name], MD; 12/07/2020 8:58 AM) PREDIABETES (R73.03) S/P LAPAROSCOPIC SLEEVE GASTRECTOMY (Z98.84) at [place] in 04/2013 with Dr Darnell Level - reviewed op note - mentioned dissecting around hiatus and did not see hiatal hernia; used 19 Fr bougie, no mention of clips HISTORY OF GOUT (Z87.39) SEVERE OBESITY (E66.01) I believe this patient meets revisional surgery criteria. It appears that a portion of his proximal sleeve has migrated into his mediastinum with a hiatal hernia. This needs to be surgically corrected. He was previously given a copy of his MBSAQIP risk/benefits outcomes  This patient encounter took 22 minutes today to perform the following: take history, perform exam, review outside records, interpret imaging, counsel the patient on their diagnosis and document encounter, findings & plan in the [place] GERD (K21.9, K44.9)  Past Surgical History Nathaniel Hiss M. [name], MD; 12/07/2020 8:58 AM) Tonsillectomy  Diagnostic Studies History Nathaniel Hiss M. [name], MD; 12/07/2020 8:58 AM) Colonoscopy 1-5 years ago  Allergies Nathaniel Hiss M. [name], MD; 12/07/2020 8:58 AM) No Known Drug Allergies [08/09/2020]:  Medication History Andreas Blower, CMA; 12/07/2020 8:27 AM) Sucralfate (1GM Tablet, Oral) Active. amLODIPine-Olmesartan  (10-20MG  Tablet, Oral) Active. Indapamide (2.5MG  Tablet, Oral) Active. Pantoprazole Sodium (40MG  Tablet DR, Oral) Active. Metoprolol Succinate ER (50MG  Tablet ER 24HR, Oral) Active. ALPRAZolam (0.5MG  Tablet, Oral) Active. Sertraline HCl (50MG  Tablet, Oral) Active. Medications Reconciled  Social History Nathaniel Hiss M. [name], MD; 12/07/2020 8:58 AM) Caffeine use Carbonated beverages, Tea. No alcohol use No drug use Tobacco use Never smoker.  Family History Nathaniel Hiss M. [name], MD; 12/07/2020 8:58 AM) Alcohol Abuse Father. Bleeding disorder Mother. Breast Cancer Family Members In [place] Father, Mother. Diabetes Mellitus Family Members In General, Father. Heart Disease Family Members In General, Father. Hypertension Brother, Family Members In [place], Father, Mother. Melanoma Family Members In General. Respiratory Condition Family Members In General. Thyroid problems Family Members In General, Mother.  Other Problems Nathaniel Hiss M. [name], MD; 12/07/2020 8:58 AM) Anxiety Disorder Gastroesophageal Reflux Disease PRIMARY HYPERTENSION (I10)     Review of Systems Nathaniel Hiss M. Wilson MD; 12/07/2020 8:56 AM) General Present- Fatigue. Not Present- Appetite Loss, Chills, Fever, Night Sweats, Weight Gain and Weight Loss. Skin Not Present- Change in Wart/Mole, Dryness, Hives, Jaundice, New Lesions, Non-Healing Wounds, Rash and Ulcer. HEENT Present- Nose Bleed and Seasonal Allergies. Not Present- Earache, Hearing Loss, Hoarseness, Oral Ulcers, Ringing in the Ears, Sinus Pain, Sore Throat, Visual Disturbances, Wears glasses/contact lenses and Yellow Eyes. Respiratory Not Present- Bloody sputum, Chronic Cough, Difficulty Breathing, Snoring and Wheezing. Breast Not Present- Breast Mass, Breast Pain, Nipple Discharge and Skin Changes. Cardiovascular  Not Present- Chest Pain, Difficulty Breathing Lying Down, Leg Cramps, Palpitations, Rapid Heart Rate, Shortness of Breath and  Swelling of Extremities. Gastrointestinal Present- Indigestion. Not Present- Abdominal Pain, Bloating, Bloody Stool, Change in Bowel Habits, Chronic diarrhea, Constipation, Difficulty Swallowing, Excessive gas, Gets full quickly at meals, Hemorrhoids, Nausea, Rectal Pain and Vomiting. Male Genitourinary Not Present- Blood in Urine, Change in Urinary Stream, Frequency, Impotence, Nocturia, Painful Urination, Urgency and Urine Leakage. Musculoskeletal Not Present- Back Pain, Joint Pain, Joint Stiffness, Muscle Pain, Muscle Weakness and Swelling of Extremities. Neurological Not Present- Decreased Memory, Fainting, Headaches, Numbness, Seizures, Tingling, Tremor, Trouble walking and Weakness. Psychiatric Present- Anxiety. Not Present- Bipolar, Change in Sleep Pattern, Depression, Fearful and Frequent crying. Endocrine Not Present- Cold Intolerance, Excessive Hunger, Hair Changes, Heat Intolerance and New Diabetes. Hematology Not Present- Blood Thinners, Easy Bruising, Excessive bleeding, Gland problems, HIV and Persistent Infections.  Vitals (Armen Ferguson CMA; 12/07/2020 8:27 AM) 12/07/2020 8:27 AM Weight: 346.5 lb Height: 68in Body Surface Area: 2.58 m Body Mass Index: 52.68 kg/m  Temp.: 97.67F  Pulse: 74 (Regular)  P.OX: 97% (Room air) BP: 132/76(Sitting, Left Arm, Standard)        Physical Exam Nathaniel Hiss M. Wilson MD; 12/07/2020 8:56 AM)  General Mental Status-Alert. General Appearance-Consistent with stated age. Hydration-Well hydrated. Voice-Normal.  Head and Neck Head-normocephalic, atraumatic with no lesions or palpable masses. Trachea-midline. Thyroid Gland Characteristics - normal size and consistency.  Eye Eyeball - Bilateral-Normal. Sclera/Conjunctiva - Bilateral-No scleral icterus.  Chest and Lung Exam Chest and lung exam reveals -quiet, even and easy respiratory effort with no use of accessory muscles and on auscultation, normal breath  sounds, no adventitious sounds and normal vocal resonance. Inspection Chest Wall - Normal. Back - normal.  Breast - Did not examine.  Cardiovascular Cardiovascular examination reveals -normal heart sounds, regular rate and rhythm with no murmurs and normal pedal pulses bilaterally.  Abdomen Inspection  Inspection of the abdomen reveals: Note: appears to have small umbilical hernia (right side of umbilicus). Skin - Scar - Note: well healed trocar scars. Palpation/Percussion Palpation and Percussion of the abdomen reveal - Soft, Non Tender, No Rebound tenderness, No Rigidity (guarding) and No hepatosplenomegaly. Auscultation Auscultation of the abdomen reveals - Bowel sounds normal.  Peripheral Vascular Upper Extremity Palpation - Pulses bilaterally normal.  Neurologic Neurologic evaluation reveals -alert and oriented x 3 with no impairment of recent or remote memory. Mental Status-Normal.  Neuropsychiatric The patient's mood and affect are described as -normal. Judgment and Insight-insight is appropriate concerning matters relevant to self.  Musculoskeletal Normal Exam - Left-Upper Extremity Strength Normal and Lower Extremity Strength Normal. Normal Exam - Right-Upper Extremity Strength Normal and Lower Extremity Strength Normal.  Lymphatic Head & Neck  General Head & Neck Lymphatics: Bilateral - Description - Normal. Axillary - Did not examine. Femoral & Inguinal - Did not examine.    Assessment & Plan Nathaniel Hiss M. Wilson MD; 12/07/2020 8:58 AM)  HIATAL HERNIA WITH GERD (K21.9) Impression: The patient has ongoing symptomatic GERD despite multiple oral medications. He also has a moderate hiatal hernia as seen on CT and upper GI. We discussed surgical options such as laparoscopic hiatal hernia repair but there would be no guarantee that that would resolve his reflux as well as the fact that he would be at increased risk for hiatal hernia recurrence. Therefore I  still recommend laparoscopic repair of his hiatal hernia with conversion to Roux-en-Y gastric bypass. We did rediscuss that the first portion of the procedure would involve dissecting around  the hiatus, reducing the sleeve out of the mediastinum, mobilizing the esophagus, closing the diaphragm, then creating the pouch and then performing the small intestinal part of the procedure. I believe this would be his best option for resolution of his hiatal hernia/migration of portion of his sleeve into his chest as well as management of his GERD. We did rediscuss that with revision patients there is increased risk for injury to surrounding structures, increased risk for a length of stay, readmission.   SEVERE OBESITY (E66.01) Story: I believe this patient meets revisional surgery criteria. It appears that a portion of his proximal sleeve has migrated into his mediastinum with a hiatal hernia. This needs to be surgically corrected. He was previously given a copy of his MBSAQIP risk/benefits outcomes  This patient encounter took 22 minutes today to perform the following: take history, perform exam, review outside records, interpret imaging, counsel the patient on their diagnosis and document encounter, findings & plan in the EHR Impression: The patient meets weight loss surgery criteria. I think the patient would be an acceptable candidate for conversion to Laparoscopic Roux-en-Y Gastric bypass.  We reviewed his workup. We discussed the findings of his upper GI, and EGDs. We reviewed his labs. We reviewed the steps of the procedure. We discussed the typical hospitalization. We discussed the possibility of performing an upper GI and hospital. We discussed the potential of readmission. We discussed dehydration potential. We discussed the diet progression after surgery. We discussed eating techniques after surgery.  All of his questions were asked and answered  Current Plans Pt Education - EMW_preopbariatric  S/P  LAPAROSCOPIC SLEEVE GASTRECTOMY (Z98.84) Story: at [place] in 04/2013 with Dr Darnell Level - reviewed op note - mentioned dissecting around hiatus and did not see hiatal hernia; used 23 Fr bougie, no mention of clips Impression: It appears that a portion of his sleeve gastrectomy has herniated into his mediastinum through a diaphragmatic/hiatal hernia  I did explained that given his prior foregut surgery as well as possible dissection around his hiatus that he would be at slightly increased risk for injury to surrounding structures such as the stomach, esophagus, lungs and spleen.   PRIMARY HYPERTENSION (I10)   PREDIABETES (R73.03)   HISTORY OF GOUT (Z87.39)  [name]. [name], MD, FACS General, Bariatric, & Minimally Invasive Surgery [facility], [place]

## 2020-12-07 NOTE — H&P (Signed)
Karen Kitchens Appointment: 12/07/2020 8:45 AM Location: [facility] Patient #: [id number] DOB: 11/19/1980 Single / Language: Cleophus Molt / Race: White Male  History of Present Illness Randall Hiss M. Manly Nestle MD; 12/07/2020 8:56 AM) The patient is a 40 year old male who presents for a bariatric surgery evaluation. He comes in for follow-up regarding his reflux, hiatal hernia and severe obesity. He underwent laparoscopic sleeve gastrectomy in 2014 at outside hospital. He has completed our bariatric surgery revisional pathway. He has received psychological nutritional clearance. He denies any medical changes since I saw him in October. He denies any trips to the emergency room. He denies any chest pain, shortness of breath, dyspnea on exertion, orthopnea, angina. He denies any tobacco. He is still having daily reflux. He may have a rare globus sensation. He has a history of gout and kidney stones as well. He had labs back in October 2021 at his PCPs office which I reviewed in care everywhere. Prediabetes with an A1c of 5.7. Total cholesterol 200. Normal metabolic panel, normal CBC, normal TSH. Normal chest x-ray. Normal EKG   08/09/20 He comes in today to discuss bariatric surgery specifically revisional surgery. His primary care doctor is Dr. Emily Filbert at the [facility] clinic. He completed our seminar on line. He underwent laparoscopic sleeve gastrectomy in 2014 by Dr. Darnell Level at [facility]. He states that he was told he had a small hiatal hernia preoperatively but it was not repaired during surgery.  His comorbidities include hypertension, refractory GERD, hiatal hernia  He denies any chest pain, chest pressure, shortness of breath, orthopnea, nocturnal dyspnea, dyspnea on exertion, TIAs or amaurosis fugax. He denies any peripheral edema. He denies any personal or family history of blood clots. He had a remote sleep study test which was negative. He has sleep apnea questionnaire was low  risk.   He has daily reflux and heartburn. He is taking Protonix twice a day, Carafate 4 times a day, and cimetidine at night and is still having breakthrough symptoms. He states he only had controlled with dexilant that insurance would not cover it. He describes a sensation of burning in his upper abdomen, and intense pressure in his epigastric area. He will get short of breath at night due to coughing. He will occasionally have to come up at night if he eats late. He denies any pain with swallowing. He has had a laparoscopic cholecystectomy. He has had an extensive workup for his reflux. He was in consideration of going to [facility] as well as [facility] and completed some of their requirements for revisional surgery. He has undergone several endoscopies. The most recent one was last summer which I reviewed with results below. He also had a CT abdomen and pelvis in August 2020 which I personally reviewed. He has a moderate hiatal hernia. He also had an upper GI in August 2020 which showed moderate hiatal hernia, gastroesophageal reflux disease, and questionable ulcer near the GE junction.  He denies any dysuria or hematuria or melena or hematochezia. He denies any joint pain. He is on medication to prevent kidney stone formation. He denies any blurry vision.  He denies any tobacco, alcohol or drug use. He has a FNP with [employer].  egd 04/2019 LA Grade C (one or more mucosal breaks continuous between tops of 2 or more mucosal folds, less than 75% circumference) esophagitis with no bleeding was found. Findings: There were esophageal mucosal changes consistent with short-segment Barrett's esophagus present in the lower third of the esophagus.  The maximum longitudinal extent of these mucosal changes was 2 cm in length. Mucosa was biopsied with a cold forceps for histology in 4 quadrants at intervals of 2 cm at 32 and 34 cm from the incisors. A total of 2 specimen bottles were sent to  pathology. Evidence of a sleeve gastrectomy was found in the gastric body. This was characterized by healthy appearing mucosa. However, there is an area of patulous gastric wasll in the region of the sliding hiatal hernia that will protrude up and down through the GE junction. With possible impairmant of drainage in that area. Patchy mild inflammation characterized by congestion (edema) and erosions was found in the gastric antrum and in the prepyloric region of the stomach. Biopsies were taken with a cold forceps for histology. Gastric biopsies also taken from the body of the stomach as well. The examined duodenum was normal. -   Problem List/Past Medical Randall Hiss M. [name], MD; 12/07/2020 8:58 AM) PREDIABETES (R73.03) S/P LAPAROSCOPIC SLEEVE GASTRECTOMY (Z98.84) at [place] in 04/2013 with Dr Darnell Level - reviewed op note - mentioned dissecting around hiatus and did not see hiatal hernia; used 13 Fr bougie, no mention of clips HISTORY OF GOUT (Z87.39) SEVERE OBESITY (E66.01) I believe this patient meets revisional surgery criteria. It appears that a portion of his proximal sleeve has migrated into his mediastinum with a hiatal hernia. This needs to be surgically corrected. He was previously given a copy of his MBSAQIP risk/benefits outcomes  This patient encounter took 22 minutes today to perform the following: take history, perform exam, review outside records, interpret imaging, counsel the patient on their diagnosis and document encounter, findings & plan in the [place] GERD (K21.9, K44.9)  Past Surgical History Randall Hiss M. [name], MD; 12/07/2020 8:58 AM) Tonsillectomy  Diagnostic Studies History Randall Hiss M. [name], MD; 12/07/2020 8:58 AM) Colonoscopy 1-5 years ago  Allergies Randall Hiss M. [name], MD; 12/07/2020 8:58 AM) No Known Drug Allergies [08/09/2020]:  Medication History Andreas Blower, CMA; 12/07/2020 8:27 AM) Sucralfate (1GM Tablet, Oral) Active. amLODIPine-Olmesartan  (10-20MG  Tablet, Oral) Active. Indapamide (2.5MG  Tablet, Oral) Active. Pantoprazole Sodium (40MG  Tablet DR, Oral) Active. Metoprolol Succinate ER (50MG  Tablet ER 24HR, Oral) Active. ALPRAZolam (0.5MG  Tablet, Oral) Active. Sertraline HCl (50MG  Tablet, Oral) Active. Medications Reconciled  Social History Randall Hiss M. [name], MD; 12/07/2020 8:58 AM) Caffeine use Carbonated beverages, Tea. No alcohol use No drug use Tobacco use Never smoker.  Family History Randall Hiss M. [name], MD; 12/07/2020 8:58 AM) Alcohol Abuse Father. Bleeding disorder Mother. Breast Cancer Family Members In [place] Father, Mother. Diabetes Mellitus Family Members In General, Father. Heart Disease Family Members In General, Father. Hypertension Brother, Family Members In [place], Father, Mother. Melanoma Family Members In General. Respiratory Condition Family Members In General. Thyroid problems Family Members In General, Mother.  Other Problems Randall Hiss M. [name], MD; 12/07/2020 8:58 AM) Anxiety Disorder Gastroesophageal Reflux Disease PRIMARY HYPERTENSION (I10)     Review of Systems Randall Hiss M. Shalom Ware MD; 12/07/2020 8:56 AM) General Present- Fatigue. Not Present- Appetite Loss, Chills, Fever, Night Sweats, Weight Gain and Weight Loss. Skin Not Present- Change in Wart/Mole, Dryness, Hives, Jaundice, New Lesions, Non-Healing Wounds, Rash and Ulcer. HEENT Present- Nose Bleed and Seasonal Allergies. Not Present- Earache, Hearing Loss, Hoarseness, Oral Ulcers, Ringing in the Ears, Sinus Pain, Sore Throat, Visual Disturbances, Wears glasses/contact lenses and Yellow Eyes. Respiratory Not Present- Bloody sputum, Chronic Cough, Difficulty Breathing, Snoring and Wheezing. Breast Not Present- Breast Mass, Breast Pain, Nipple Discharge and Skin Changes. Cardiovascular  Not Present- Chest Pain, Difficulty Breathing Lying Down, Leg Cramps, Palpitations, Rapid Heart Rate, Shortness of Breath and  Swelling of Extremities. Gastrointestinal Present- Indigestion. Not Present- Abdominal Pain, Bloating, Bloody Stool, Change in Bowel Habits, Chronic diarrhea, Constipation, Difficulty Swallowing, Excessive gas, Gets full quickly at meals, Hemorrhoids, Nausea, Rectal Pain and Vomiting. Male Genitourinary Not Present- Blood in Urine, Change in Urinary Stream, Frequency, Impotence, Nocturia, Painful Urination, Urgency and Urine Leakage. Musculoskeletal Not Present- Back Pain, Joint Pain, Joint Stiffness, Muscle Pain, Muscle Weakness and Swelling of Extremities. Neurological Not Present- Decreased Memory, Fainting, Headaches, Numbness, Seizures, Tingling, Tremor, Trouble walking and Weakness. Psychiatric Present- Anxiety. Not Present- Bipolar, Change in Sleep Pattern, Depression, Fearful and Frequent crying. Endocrine Not Present- Cold Intolerance, Excessive Hunger, Hair Changes, Heat Intolerance and New Diabetes. Hematology Not Present- Blood Thinners, Easy Bruising, Excessive bleeding, Gland problems, HIV and Persistent Infections.  Vitals (Armen Ferguson CMA; 12/07/2020 8:27 AM) 12/07/2020 8:27 AM Weight: 346.5 lb Height: 68in Body Surface Area: 2.58 m Body Mass Index: 52.68 kg/m  Temp.: 97.62F  Pulse: 74 (Regular)  P.OX: 97% (Room air) BP: 132/76(Sitting, Left Arm, Standard)        Physical Exam Randall Hiss M. Blain Hunsucker MD; 12/07/2020 8:56 AM)  General Mental Status-Alert. General Appearance-Consistent with stated age. Hydration-Well hydrated. Voice-Normal.  Head and Neck Head-normocephalic, atraumatic with no lesions or palpable masses. Trachea-midline. Thyroid Gland Characteristics - normal size and consistency.  Eye Eyeball - Bilateral-Normal. Sclera/Conjunctiva - Bilateral-No scleral icterus.  Chest and Lung Exam Chest and lung exam reveals -quiet, even and easy respiratory effort with no use of accessory muscles and on auscultation, normal breath  sounds, no adventitious sounds and normal vocal resonance. Inspection Chest Wall - Normal. Back - normal.  Breast - Did not examine.  Cardiovascular Cardiovascular examination reveals -normal heart sounds, regular rate and rhythm with no murmurs and normal pedal pulses bilaterally.  Abdomen Inspection  Inspection of the abdomen reveals: Note: appears to have small umbilical hernia (right side of umbilicus). Skin - Scar - Note: well healed trocar scars. Palpation/Percussion Palpation and Percussion of the abdomen reveal - Soft, Non Tender, No Rebound tenderness, No Rigidity (guarding) and No hepatosplenomegaly. Auscultation Auscultation of the abdomen reveals - Bowel sounds normal.  Peripheral Vascular Upper Extremity Palpation - Pulses bilaterally normal.  Neurologic Neurologic evaluation reveals -alert and oriented x 3 with no impairment of recent or remote memory. Mental Status-Normal.  Neuropsychiatric The patient's mood and affect are described as -normal. Judgment and Insight-insight is appropriate concerning matters relevant to self.  Musculoskeletal Normal Exam - Left-Upper Extremity Strength Normal and Lower Extremity Strength Normal. Normal Exam - Right-Upper Extremity Strength Normal and Lower Extremity Strength Normal.  Lymphatic Head & Neck  General Head & Neck Lymphatics: Bilateral - Description - Normal. Axillary - Did not examine. Femoral & Inguinal - Did not examine.    Assessment & Plan Randall Hiss M. Willer Osorno MD; 12/07/2020 8:58 AM)  HIATAL HERNIA WITH GERD (K21.9) Impression: The patient has ongoing symptomatic GERD despite multiple oral medications. He also has a moderate hiatal hernia as seen on CT and upper GI. We discussed surgical options such as laparoscopic hiatal hernia repair but there would be no guarantee that that would resolve his reflux as well as the fact that he would be at increased risk for hiatal hernia recurrence. Therefore I  still recommend laparoscopic repair of his hiatal hernia with conversion to Roux-en-Y gastric bypass. We did rediscuss that the first portion of the procedure would involve dissecting around  the hiatus, reducing the sleeve out of the mediastinum, mobilizing the esophagus, closing the diaphragm, then creating the pouch and then performing the small intestinal part of the procedure. I believe this would be his best option for resolution of his hiatal hernia/migration of portion of his sleeve into his chest as well as management of his GERD. We did rediscuss that with revision patients there is increased risk for injury to surrounding structures, increased risk for a length of stay, readmission.   SEVERE OBESITY (E66.01) Story: I believe this patient meets revisional surgery criteria. It appears that a portion of his proximal sleeve has migrated into his mediastinum with a hiatal hernia. This needs to be surgically corrected. He was previously given a copy of his MBSAQIP risk/benefits outcomes  This patient encounter took 22 minutes today to perform the following: take history, perform exam, review outside records, interpret imaging, counsel the patient on their diagnosis and document encounter, findings & plan in the EHR Impression: The patient meets weight loss surgery criteria. I think the patient would be an acceptable candidate for conversion to Laparoscopic Roux-en-Y Gastric bypass.  We reviewed his workup. We discussed the findings of his upper GI, and EGDs. We reviewed his labs. We reviewed the steps of the procedure. We discussed the typical hospitalization. We discussed the possibility of performing an upper GI and hospital. We discussed the potential of readmission. We discussed dehydration potential. We discussed the diet progression after surgery. We discussed eating techniques after surgery.  All of his questions were asked and answered  Current Plans Pt Education - EMW_preopbariatric  S/P  LAPAROSCOPIC SLEEVE GASTRECTOMY (Z98.84) Story: at [place] in 04/2013 with Dr Darnell Level - reviewed op note - mentioned dissecting around hiatus and did not see hiatal hernia; used 41 Fr bougie, no mention of clips Impression: It appears that a portion of his sleeve gastrectomy has herniated into his mediastinum through a diaphragmatic/hiatal hernia  I did explained that given his prior foregut surgery as well as possible dissection around his hiatus that he would be at slightly increased risk for injury to surrounding structures such as the stomach, esophagus, lungs and spleen.   PRIMARY HYPERTENSION (I10)   PREDIABETES (R73.03)   HISTORY OF GOUT (Z87.39)  [name]. [name], MD, FACS General, Bariatric, & Minimally Invasive Surgery [facility], [place]

## 2020-12-07 NOTE — Progress Notes (Signed)
EKG-09/04/2020 in epic along with CXR- 09/04/20- epic

## 2020-12-07 NOTE — Progress Notes (Signed)
Completed medical hx and instructions via phone with pt for surgery on 12/17/2020.

## 2020-12-13 ENCOUNTER — Other Ambulatory Visit: Payer: Self-pay

## 2020-12-13 ENCOUNTER — Other Ambulatory Visit (HOSPITAL_COMMUNITY): Payer: 59

## 2020-12-13 ENCOUNTER — Other Ambulatory Visit
Admission: RE | Admit: 2020-12-13 | Discharge: 2020-12-13 | Disposition: A | Discharge status: Home / Self Care | Payer: 59 | Source: Ambulatory Visit | Attending: General Surgery | Admitting: General Surgery

## 2020-12-13 DIAGNOSIS — Z01812 Encounter for preprocedural laboratory examination: Secondary | ICD-10-CM | POA: Diagnosis not present

## 2020-12-13 DIAGNOSIS — Z20822 Contact with and (suspected) exposure to covid-19: Secondary | ICD-10-CM | POA: Diagnosis not present

## 2020-12-14 LAB — SARS CORONAVIRUS 2 (TAT 6-24 HRS): SARS Coronavirus 2: NEGATIVE

## 2020-12-16 MED ORDER — BUPIVACAINE LIPOSOME 1.3 % IJ SUSP
20.0000 mL | Freq: Once | INTRAMUSCULAR | Status: DC
  Filled 2020-12-16: qty 20

## 2020-12-16 NOTE — Anesthesia Preprocedure Evaluation (Addendum)
Anesthesia Evaluation  Patient identified by MRN, date of birth, ID band Patient awake    Reviewed: Allergy & Precautions, NPO status , Patient's Chart, lab work & pertinent test results, reviewed documented beta blocker date and time   Airway Mallampati: II  TM Distance: >3 FB Neck ROM: Full    Dental  (+) Teeth Intact, Dental Advisory Given   Pulmonary neg pulmonary ROS,    Pulmonary exam normal breath sounds clear to auscultation       Cardiovascular hypertension, Pt. on medications and Pt. on home beta blockers Normal cardiovascular exam Rhythm:Regular Rate:Normal  11-21 EKG SR R 78 155/80 in preop, usually runs lower    Neuro/Psych  Headaches, PSYCHIATRIC DISORDERS Anxiety    GI/Hepatic Neg liver ROS, hiatal hernia, GERD  Medicated and Controlled,S/p sleeve gastrectomy- being converted to roux-en-y Severe GERD    Endo/Other  Morbid obesityBMI 52  Renal/GU Renal InsufficiencyRenal diseaseCr 1.28   negative genitourinary   Musculoskeletal negative musculoskeletal ROS (+)   Abdominal (+) + obese,   Peds  Hematology hct 46.3   Anesthesia Other Findings   Reproductive/Obstetrics negative OB ROS                           Anesthesia Physical Anesthesia Plan  ASA: III  Anesthesia Plan: General   Post-op Pain Management:    Induction: Intravenous, Rapid sequence and Cricoid pressure planned  PONV Risk Score and Plan: 4 or greater and Treatment may vary due to age or medical condition, Midazolam, Ondansetron, Dexamethasone, Scopolamine patch - Pre-op and Aprepitant  Airway Management Planned: Oral ETT  Additional Equipment: None  Intra-op Plan:   Post-operative Plan: Extubation in OR  Informed Consent: I have reviewed the patients History and Physical, chart, labs and discussed the procedure including the risks, benefits and alternatives for the proposed anesthesia with the patient  or authorized representative who has indicated his/her understanding and acceptance.     Dental advisory given  Plan Discussed with: CRNA  Anesthesia Plan Comments:       Anesthesia Quick Evaluation

## 2020-12-17 ENCOUNTER — Inpatient Hospital Stay (HOSPITAL_COMMUNITY)
Admission: RE | Admit: 2020-12-17 | Discharge: 2020-12-18 | DRG: 621 | Disposition: A | Discharge status: Home / Self Care | Payer: 59 | Attending: General Surgery | Admitting: General Surgery

## 2020-12-17 ENCOUNTER — Encounter (HOSPITAL_COMMUNITY): Payer: Self-pay | Admitting: General Surgery

## 2020-12-17 ENCOUNTER — Encounter (HOSPITAL_COMMUNITY)
Admission: RE | Disposition: A | Discharge status: Home / Self Care | Payer: Self-pay | Source: Home / Self Care | Attending: General Surgery

## 2020-12-17 ENCOUNTER — Inpatient Hospital Stay (HOSPITAL_COMMUNITY): Payer: 59 | Admitting: Anesthesiology

## 2020-12-17 DIAGNOSIS — M5412 Radiculopathy, cervical region: Secondary | ICD-10-CM | POA: Diagnosis present

## 2020-12-17 DIAGNOSIS — Z79899 Other long term (current) drug therapy: Secondary | ICD-10-CM | POA: Diagnosis not present

## 2020-12-17 DIAGNOSIS — K449 Diaphragmatic hernia without obstruction or gangrene: Secondary | ICD-10-CM | POA: Diagnosis present

## 2020-12-17 DIAGNOSIS — Z87442 Personal history of urinary calculi: Secondary | ICD-10-CM | POA: Diagnosis not present

## 2020-12-17 DIAGNOSIS — M109 Gout, unspecified: Secondary | ICD-10-CM | POA: Diagnosis not present

## 2020-12-17 DIAGNOSIS — Z800 Family history of malignant neoplasm of digestive organs: Secondary | ICD-10-CM

## 2020-12-17 DIAGNOSIS — Z803 Family history of malignant neoplasm of breast: Secondary | ICD-10-CM

## 2020-12-17 DIAGNOSIS — F419 Anxiety disorder, unspecified: Secondary | ICD-10-CM | POA: Diagnosis not present

## 2020-12-17 DIAGNOSIS — K2270 Barrett's esophagus without dysplasia: Secondary | ICD-10-CM | POA: Diagnosis present

## 2020-12-17 DIAGNOSIS — Z9049 Acquired absence of other specified parts of digestive tract: Secondary | ICD-10-CM | POA: Diagnosis not present

## 2020-12-17 DIAGNOSIS — Z8739 Personal history of other diseases of the musculoskeletal system and connective tissue: Secondary | ICD-10-CM

## 2020-12-17 DIAGNOSIS — Z6841 Body Mass Index (BMI) 40.0 and over, adult: Secondary | ICD-10-CM | POA: Diagnosis not present

## 2020-12-17 DIAGNOSIS — K219 Gastro-esophageal reflux disease without esophagitis: Secondary | ICD-10-CM | POA: Diagnosis not present

## 2020-12-17 DIAGNOSIS — I10 Essential (primary) hypertension: Secondary | ICD-10-CM | POA: Diagnosis present

## 2020-12-17 DIAGNOSIS — Z9884 Bariatric surgery status: Secondary | ICD-10-CM | POA: Diagnosis not present

## 2020-12-17 DIAGNOSIS — Z8249 Family history of ischemic heart disease and other diseases of the circulatory system: Secondary | ICD-10-CM

## 2020-12-17 HISTORY — PX: HIATAL HERNIA REPAIR: SHX195

## 2020-12-17 HISTORY — PX: GASTRIC ROUX-EN-Y: SHX5262

## 2020-12-17 LAB — TYPE AND SCREEN
ABO/RH(D): A POS
Antibody Screen: NEGATIVE

## 2020-12-17 LAB — HEMOGLOBIN AND HEMATOCRIT, BLOOD
HCT: 45.2 % (ref 39.0–52.0)
Hemoglobin: 14.1 g/dL (ref 13.0–17.0)

## 2020-12-17 LAB — ABO/RH: ABO/RH(D): A POS

## 2020-12-17 SURGERY — LAPAROSCOPIC ROUX-EN-Y GASTRIC BYPASS WITH UPPER ENDOSCOPY
Anesthesia: General

## 2020-12-17 MED ORDER — KETAMINE HCL 10 MG/ML IJ SOLN
INTRAMUSCULAR | Status: AC
  Filled 2020-12-17: qty 1

## 2020-12-17 MED ORDER — ENOXAPARIN SODIUM 30 MG/0.3ML ~~LOC~~ SOLN
30.0000 mg | Freq: Two times a day (BID) | SUBCUTANEOUS | Status: DC
  Administered 2020-12-17 – 2020-12-18 (×2): 30 mg via SUBCUTANEOUS
  Filled 2020-12-17 (×3): qty 0.3

## 2020-12-17 MED ORDER — ENSURE MAX PROTEIN PO LIQD
2.0000 [oz_av] | ORAL | Status: DC
  Administered 2020-12-18: 2 [oz_av] via ORAL

## 2020-12-17 MED ORDER — OXYCODONE HCL 5 MG/5ML PO SOLN
5.0000 mg | Freq: Four times a day (QID) | ORAL | Status: DC | PRN
Start: 2020-12-17 — End: 2020-12-18

## 2020-12-17 MED ORDER — SCOPOLAMINE 1 MG/3DAYS TD PT72
1.0000 | MEDICATED_PATCH | TRANSDERMAL | Status: DC
  Administered 2020-12-17: 1.5 mg via TRANSDERMAL
  Filled 2020-12-17: qty 1

## 2020-12-17 MED ORDER — "VISTASEAL 4 ML SINGLE DOSE KIT "
4.0000 mL | PACK | Freq: Once | CUTANEOUS | Status: DC
  Filled 2020-12-17: qty 4

## 2020-12-17 MED ORDER — ACETAMINOPHEN 500 MG PO TABS
1000.0000 mg | ORAL_TABLET | Freq: Three times a day (TID) | ORAL | Status: DC
  Administered 2020-12-17 – 2020-12-18 (×3): 1000 mg via ORAL
  Filled 2020-12-17 (×3): qty 2

## 2020-12-17 MED ORDER — DEXAMETHASONE SODIUM PHOSPHATE 10 MG/ML IJ SOLN
INTRAMUSCULAR | Status: DC | PRN
  Administered 2020-12-17: 10 mg via INTRAVENOUS

## 2020-12-17 MED ORDER — HEPARIN SODIUM (PORCINE) 5000 UNIT/ML IJ SOLN
5000.0000 [IU] | INTRAMUSCULAR | Status: AC
  Administered 2020-12-17: 5000 [IU] via SUBCUTANEOUS
  Filled 2020-12-17: qty 1

## 2020-12-17 MED ORDER — FIBRIN SEALANT 2 ML SINGLE DOSE KIT
2.0000 mL | PACK | Freq: Once | CUTANEOUS | Status: AC
  Administered 2020-12-17: 2 mL via TOPICAL
  Filled 2020-12-17: qty 2

## 2020-12-17 MED ORDER — MIDAZOLAM HCL 2 MG/2ML IJ SOLN
INTRAMUSCULAR | Status: AC
  Filled 2020-12-17: qty 2

## 2020-12-17 MED ORDER — HYDROMORPHONE HCL 1 MG/ML IJ SOLN
INTRAMUSCULAR | Status: AC
  Administered 2020-12-17: 0.25 mg via INTRAVENOUS
  Filled 2020-12-17: qty 1

## 2020-12-17 MED ORDER — PHENYLEPHRINE HCL-NACL 10-0.9 MG/250ML-% IV SOLN
INTRAVENOUS | Status: DC | PRN
  Administered 2020-12-17: 50 ug/min via INTRAVENOUS

## 2020-12-17 MED ORDER — ROCURONIUM BROMIDE 10 MG/ML (PF) SYRINGE
PREFILLED_SYRINGE | INTRAVENOUS | Status: DC | PRN
  Administered 2020-12-17 (×2): 30 mg via INTRAVENOUS
  Administered 2020-12-17: 70 mg via INTRAVENOUS
  Administered 2020-12-17: 30 mg via INTRAVENOUS
  Administered 2020-12-17 (×2): 20 mg via INTRAVENOUS

## 2020-12-17 MED ORDER — PHENYLEPHRINE 40 MCG/ML (10ML) SYRINGE FOR IV PUSH (FOR BLOOD PRESSURE SUPPORT)
PREFILLED_SYRINGE | INTRAVENOUS | Status: AC
  Filled 2020-12-17: qty 10

## 2020-12-17 MED ORDER — OXYCODONE HCL 5 MG/5ML PO SOLN: 5.0000 mg | Freq: Once | ORAL | Status: DC | PRN

## 2020-12-17 MED ORDER — FENTANYL CITRATE (PF) 250 MCG/5ML IJ SOLN
INTRAMUSCULAR | Status: AC
  Filled 2020-12-17: qty 5

## 2020-12-17 MED ORDER — LIDOCAINE HCL 2 % IJ SOLN
INTRAMUSCULAR | Status: AC
  Filled 2020-12-17: qty 20

## 2020-12-17 MED ORDER — CHLORHEXIDINE GLUCONATE 4 % EX LIQD: 60.0000 mL | Freq: Once | CUTANEOUS | Status: DC

## 2020-12-17 MED ORDER — ONDANSETRON HCL 4 MG/2ML IJ SOLN
INTRAMUSCULAR | Status: AC
  Filled 2020-12-17: qty 2

## 2020-12-17 MED ORDER — AMISULPRIDE (ANTIEMETIC) 5 MG/2ML IV SOLN: 10.0000 mg | Freq: Once | INTRAVENOUS | Status: DC | PRN

## 2020-12-17 MED ORDER — IRBESARTAN 150 MG PO TABS
150.0000 mg | ORAL_TABLET | Freq: Every day | ORAL | Status: DC
  Administered 2020-12-17 – 2020-12-18 (×2): 150 mg via ORAL
  Filled 2020-12-17 (×2): qty 1

## 2020-12-17 MED ORDER — SODIUM CHLORIDE 0.9 % IR SOLN
Status: DC | PRN
  Administered 2020-12-17: 1000 mL

## 2020-12-17 MED ORDER — FENTANYL CITRATE (PF) 250 MCG/5ML IJ SOLN
INTRAMUSCULAR | Status: DC | PRN
  Administered 2020-12-17: 150 ug via INTRAVENOUS
  Administered 2020-12-17 (×2): 50 ug via INTRAVENOUS

## 2020-12-17 MED ORDER — SODIUM CHLORIDE 0.9 % IV SOLN
2.0000 g | INTRAVENOUS | Status: AC
  Administered 2020-12-17: 2 g via INTRAVENOUS
  Filled 2020-12-17: qty 2

## 2020-12-17 MED ORDER — GABAPENTIN 300 MG PO CAPS
300.0000 mg | ORAL_CAPSULE | ORAL | Status: AC
  Administered 2020-12-17: 300 mg via ORAL
  Filled 2020-12-17: qty 1

## 2020-12-17 MED ORDER — METOPROLOL TARTRATE 5 MG/5ML IV SOLN
5.0000 mg | Freq: Four times a day (QID) | INTRAVENOUS | Status: DC | PRN
  Filled 2020-12-17: qty 5

## 2020-12-17 MED ORDER — KETAMINE HCL 10 MG/ML IJ SOLN
INTRAMUSCULAR | Status: DC | PRN
  Administered 2020-12-17: 50 mg via INTRAVENOUS
  Administered 2020-12-17 (×3): 10 mg via INTRAVENOUS

## 2020-12-17 MED ORDER — "VISTASEAL 4 ML SINGLE DOSE KIT "
PACK | CUTANEOUS | Status: DC | PRN
  Administered 2020-12-17: 4 mL via TOPICAL

## 2020-12-17 MED ORDER — LACTATED RINGERS IR SOLN
Status: DC | PRN
  Administered 2020-12-17: 3000 mL

## 2020-12-17 MED ORDER — SUGAMMADEX SODIUM 200 MG/2ML IV SOLN
INTRAVENOUS | Status: DC | PRN
  Administered 2020-12-17: 350 mg via INTRAVENOUS

## 2020-12-17 MED ORDER — AMLODIPINE BESYLATE 5 MG PO TABS
5.0000 mg | ORAL_TABLET | Freq: Every day | ORAL | Status: DC
  Administered 2020-12-17 – 2020-12-18 (×2): 5 mg via ORAL
  Filled 2020-12-17 (×2): qty 1

## 2020-12-17 MED ORDER — DEXAMETHASONE SODIUM PHOSPHATE 10 MG/ML IJ SOLN
INTRAMUSCULAR | Status: AC
  Filled 2020-12-17: qty 1

## 2020-12-17 MED ORDER — APREPITANT 40 MG PO CAPS
40.0000 mg | ORAL_CAPSULE | ORAL | Status: AC
  Administered 2020-12-17: 40 mg via ORAL
  Filled 2020-12-17: qty 1

## 2020-12-17 MED ORDER — CHLORHEXIDINE GLUCONATE 0.12 % MT SOLN
15.0000 mL | Freq: Once | OROMUCOSAL | Status: AC
  Administered 2020-12-17: 15 mL via OROMUCOSAL

## 2020-12-17 MED ORDER — PROPOFOL 10 MG/ML IV BOLUS
INTRAVENOUS | Status: AC
  Filled 2020-12-17: qty 20

## 2020-12-17 MED ORDER — LIDOCAINE HCL (PF) 2 % IJ SOLN
INTRAMUSCULAR | Status: AC
  Filled 2020-12-17: qty 5

## 2020-12-17 MED ORDER — ONDANSETRON HCL 4 MG/2ML IJ SOLN
INTRAMUSCULAR | Status: DC | PRN
  Administered 2020-12-17: 4 mg via INTRAVENOUS

## 2020-12-17 MED ORDER — HYDRALAZINE HCL 20 MG/ML IJ SOLN: 10.0000 mg | INTRAMUSCULAR | Status: DC | PRN

## 2020-12-17 MED ORDER — LIDOCAINE 2% (20 MG/ML) 5 ML SYRINGE
INTRAMUSCULAR | Status: DC | PRN
  Administered 2020-12-17: 1.5 mg/kg/h via INTRAVENOUS

## 2020-12-17 MED ORDER — SUGAMMADEX SODIUM 500 MG/5ML IV SOLN
INTRAVENOUS | Status: AC
  Filled 2020-12-17: qty 5

## 2020-12-17 MED ORDER — MORPHINE SULFATE (PF) 2 MG/ML IV SOLN: 1.0000 mg | INTRAVENOUS | Status: DC | PRN

## 2020-12-17 MED ORDER — HYDROMORPHONE HCL 1 MG/ML IJ SOLN
0.2500 mg | INTRAMUSCULAR | Status: DC | PRN
Start: 2020-12-17 — End: 2020-12-17
  Administered 2020-12-17: 0.25 mg via INTRAVENOUS

## 2020-12-17 MED ORDER — METOPROLOL TARTRATE 25 MG PO TABS
25.0000 mg | ORAL_TABLET | Freq: Two times a day (BID) | ORAL | Status: DC
  Administered 2020-12-18: 25 mg via ORAL
  Filled 2020-12-17: qty 1

## 2020-12-17 MED ORDER — KCL IN DEXTROSE-NACL 20-5-0.45 MEQ/L-%-% IV SOLN
INTRAVENOUS | Status: DC
  Filled 2020-12-17 (×3): qty 1000

## 2020-12-17 MED ORDER — ENOXAPARIN (LOVENOX) PATIENT EDUCATION KIT
PACK | Freq: Once | Status: AC
  Filled 2020-12-17: qty 1

## 2020-12-17 MED ORDER — INDAPAMIDE 1.25 MG PO TABS
2.5000 mg | ORAL_TABLET | Freq: Every day | ORAL | Status: DC
  Administered 2020-12-17 – 2020-12-18 (×2): 2.5 mg via ORAL
  Filled 2020-12-17 (×2): qty 2

## 2020-12-17 MED ORDER — MIDAZOLAM HCL 2 MG/2ML IJ SOLN
INTRAMUSCULAR | Status: DC | PRN
  Administered 2020-12-17: 2 mg via INTRAVENOUS

## 2020-12-17 MED ORDER — LACTATED RINGERS IV SOLN: INTRAVENOUS | Status: DC

## 2020-12-17 MED ORDER — SUCCINYLCHOLINE CHLORIDE 200 MG/10ML IV SOSY
PREFILLED_SYRINGE | INTRAVENOUS | Status: DC | PRN
  Administered 2020-12-17: 160 mg via INTRAVENOUS

## 2020-12-17 MED ORDER — EPHEDRINE 5 MG/ML INJ
INTRAVENOUS | Status: AC
  Filled 2020-12-17: qty 10

## 2020-12-17 MED ORDER — PHENYLEPHRINE HCL (PRESSORS) 10 MG/ML IV SOLN
INTRAVENOUS | Status: AC
  Filled 2020-12-17: qty 1

## 2020-12-17 MED ORDER — ROCURONIUM BROMIDE 10 MG/ML (PF) SYRINGE
PREFILLED_SYRINGE | INTRAVENOUS | Status: AC
  Filled 2020-12-17: qty 10

## 2020-12-17 MED ORDER — BUPIVACAINE LIPOSOME 1.3 % IJ SUSP
INTRAMUSCULAR | Status: DC | PRN
  Administered 2020-12-17: 20 mL

## 2020-12-17 MED ORDER — SODIUM CHLORIDE (PF) 0.9 % IJ SOLN
INTRAMUSCULAR | Status: DC | PRN
  Administered 2020-12-17: 50 mL via INTRAVENOUS

## 2020-12-17 MED ORDER — ACETAMINOPHEN 500 MG PO TABS
1000.0000 mg | ORAL_TABLET | ORAL | Status: AC
  Administered 2020-12-17: 1000 mg via ORAL
  Filled 2020-12-17: qty 2

## 2020-12-17 MED ORDER — AMLODIPINE-OLMESARTAN 10-20 MG PO TABS: 1.0000 | ORAL_TABLET | Freq: Every day | ORAL | Status: DC

## 2020-12-17 MED ORDER — PANTOPRAZOLE SODIUM 40 MG IV SOLR
40.0000 mg | Freq: Every day | INTRAVENOUS | Status: DC
  Administered 2020-12-17: 40 mg via INTRAVENOUS
  Filled 2020-12-17: qty 40

## 2020-12-17 MED ORDER — ACETAMINOPHEN 160 MG/5ML PO SOLN: 1000.0000 mg | Freq: Three times a day (TID) | ORAL | Status: DC

## 2020-12-17 MED ORDER — SIMETHICONE 80 MG PO CHEW: 80.0000 mg | CHEWABLE_TABLET | Freq: Four times a day (QID) | ORAL | Status: DC | PRN

## 2020-12-17 MED ORDER — STERILE WATER FOR IRRIGATION IR SOLN
Status: DC | PRN
  Administered 2020-12-17 (×2): 1000 mL

## 2020-12-17 MED ORDER — ONDANSETRON HCL 4 MG/2ML IJ SOLN: 4.0000 mg | Freq: Four times a day (QID) | INTRAMUSCULAR | Status: DC | PRN

## 2020-12-17 MED ORDER — LIDOCAINE 2% (20 MG/ML) 5 ML SYRINGE
INTRAMUSCULAR | Status: DC | PRN
  Administered 2020-12-17: 60 mg via INTRAVENOUS

## 2020-12-17 MED ORDER — SERTRALINE HCL 50 MG PO TABS
50.0000 mg | ORAL_TABLET | Freq: Every day | ORAL | Status: DC
  Administered 2020-12-18: 50 mg via ORAL
  Filled 2020-12-17: qty 1

## 2020-12-17 MED ORDER — PROPOFOL 10 MG/ML IV BOLUS
INTRAVENOUS | Status: DC | PRN
Start: 2020-12-17 — End: 2020-12-17
  Administered 2020-12-17: 200 mg via INTRAVENOUS

## 2020-12-17 MED ORDER — ONDANSETRON HCL 4 MG/2ML IJ SOLN: 4.0000 mg | Freq: Once | INTRAMUSCULAR | Status: DC | PRN

## 2020-12-17 MED ORDER — EPHEDRINE SULFATE-NACL 50-0.9 MG/10ML-% IV SOSY
PREFILLED_SYRINGE | INTRAVENOUS | Status: DC | PRN
  Administered 2020-12-17: 10 mg via INTRAVENOUS
  Administered 2020-12-17: 5 mg via INTRAVENOUS
  Administered 2020-12-17: 10 mg via INTRAVENOUS

## 2020-12-17 MED ORDER — OXYCODONE HCL 5 MG PO TABS: 5.0000 mg | ORAL_TABLET | Freq: Once | ORAL | Status: DC | PRN

## 2020-12-17 MED ORDER — DEXAMETHASONE SODIUM PHOSPHATE 4 MG/ML IJ SOLN: 4.0000 mg | INTRAMUSCULAR | Status: DC

## 2020-12-17 SURGICAL SUPPLY — 106 items
APL LAPSCP 35 DL APL RGD (MISCELLANEOUS) ×2
APL PRP STRL LF DISP 70% ISPRP (MISCELLANEOUS) ×2
APL SWBSTK 6 STRL LF DISP (MISCELLANEOUS) ×1
APPLICATOR COTTON TIP 6 STRL (MISCELLANEOUS) ×1 IMPLANT
APPLICATOR COTTON TIP 6IN STRL (MISCELLANEOUS) ×2
APPLICATOR VISTASEAL 35 (MISCELLANEOUS) ×4 IMPLANT
APPLIER CLIP ROT 13.4 12 LRG (CLIP)
APR CLP LRG 13.4X12 ROT 20 MLT (CLIP)
BLADE EXTENDED COATED 6.5IN (ELECTRODE) IMPLANT
BLADE HEX COATED 2.75 (ELECTRODE) ×2 IMPLANT
BLADE SURG SZ11 CARB STEEL (BLADE) ×2 IMPLANT
BNDG ADH 1X3 SHEER STRL LF (GAUZE/BANDAGES/DRESSINGS) IMPLANT
BNDG ADH THN 3X1 STRL LF (GAUZE/BANDAGES/DRESSINGS)
CABLE HIGH FREQUENCY MONO STRZ (ELECTRODE) IMPLANT
CHLORAPREP W/TINT 26 (MISCELLANEOUS) ×4 IMPLANT
CLIP APPLIE ROT 13.4 12 LRG (CLIP) IMPLANT
CLIP SUT LAPRA TY ABSORB (SUTURE) ×3 IMPLANT
CLSR STERI-STRIP ANTIMIC 1/2X4 (GAUZE/BANDAGES/DRESSINGS) ×1 IMPLANT
COVER MAYO STAND STRL (DRAPES) IMPLANT
COVER WAND RF STERILE (DRAPES) IMPLANT
CUTTER FLEX LINEAR 45M (STAPLE) IMPLANT
DEVICE SUT QUICK LOAD TK 5 (STAPLE) ×2 IMPLANT
DEVICE SUT TI-KNOT TK 5X26 (MISCELLANEOUS) ×2 IMPLANT
DEVICE SUTURE ENDOST 10MM (ENDOMECHANICALS) ×2 IMPLANT
DISSECTOR BLUNT TIP ENDO 5MM (MISCELLANEOUS) ×1 IMPLANT
DRAIN PENROSE 0.25X18 (DRAIN) ×2 IMPLANT
DRAIN PENROSE 0.5X18 (DRAIN) ×2 IMPLANT
DRAPE UTILITY XL STRL (DRAPES) ×2 IMPLANT
DRAPE WARM FLUID 44X44 (DRAPES) IMPLANT
DRSG TEGADERM 2-3/8X2-3/4 SM (GAUZE/BANDAGES/DRESSINGS) ×2 IMPLANT
ELECT REM PT RETURN 15FT ADLT (MISCELLANEOUS) ×2 IMPLANT
GAUZE 4X4 16PLY RFD (DISPOSABLE) ×2 IMPLANT
GAUZE SPONGE 2X2 8PLY STRL LF (GAUZE/BANDAGES/DRESSINGS) ×1 IMPLANT
GAUZE SPONGE 4X4 12PLY STRL (GAUZE/BANDAGES/DRESSINGS) ×2 IMPLANT
GLOVE INDICATOR 8.0 STRL GRN (GLOVE) ×4 IMPLANT
GLOVE SURG ENC TEXT LTX SZ7.5 (GLOVE) ×2 IMPLANT
GLOVE SURG LTX SZ8 (GLOVE) ×2 IMPLANT
GLOVE SURG UNDER POLY LF SZ7 (GLOVE) ×2 IMPLANT
GOWN STRL REUS W/TWL LRG LVL3 (GOWN DISPOSABLE) ×2 IMPLANT
GOWN STRL REUS W/TWL XL LVL3 (GOWN DISPOSABLE) ×8 IMPLANT
HANDLE SUCTION POOLE (INSTRUMENTS) IMPLANT
KIT BASIN OR (CUSTOM PROCEDURE TRAY) ×2 IMPLANT
KIT GASTRIC LAVAGE 34FR ADT (SET/KITS/TRAYS/PACK) ×2 IMPLANT
KIT TURNOVER KIT A (KITS) ×2 IMPLANT
MARKER SKIN DUAL TIP RULER LAB (MISCELLANEOUS) ×2 IMPLANT
MAT PREVALON FULL STRYKER (MISCELLANEOUS) ×2 IMPLANT
NDL SPNL 22GX3.5 QUINCKE BK (NEEDLE) ×1 IMPLANT
NEEDLE SPNL 22GX3.5 QUINCKE BK (NEEDLE) ×2 IMPLANT
PACK CARDIOVASCULAR III (CUSTOM PROCEDURE TRAY) ×2 IMPLANT
PENCIL SMOKE EVACUATOR (MISCELLANEOUS) IMPLANT
RELOAD 45 VASCULAR/THIN (ENDOMECHANICALS) IMPLANT
RELOAD ENDO STITCH 2.0 (ENDOMECHANICALS) ×20
RELOAD STAPLE 45 2.5 WHT GRN (ENDOMECHANICALS) IMPLANT
RELOAD STAPLE 45 3.5 BLU ETS (ENDOMECHANICALS) IMPLANT
RELOAD STAPLE 60 2.6 WHT THN (STAPLE) ×2 IMPLANT
RELOAD STAPLE 60 3.6 BLU REG (STAPLE) ×2 IMPLANT
RELOAD STAPLE 60 3.8 GOLD REG (STAPLE) ×1 IMPLANT
RELOAD STAPLE TA45 3.5 REG BLU (ENDOMECHANICALS) IMPLANT
RELOAD STAPLER BLUE 60MM (STAPLE) ×2 IMPLANT
RELOAD STAPLER GOLD 60MM (STAPLE) ×1 IMPLANT
RELOAD STAPLER WHITE 60MM (STAPLE) ×2 IMPLANT
RELOAD SUT SNGL STCH ABSRB 2-0 (ENDOMECHANICALS) ×5 IMPLANT
RELOAD SUT SNGL STCH BLK 2-0 (ENDOMECHANICALS) ×4 IMPLANT
SCISSORS LAP 5X45 EPIX DISP (ENDOMECHANICALS) ×2 IMPLANT
SET IRRIG TUBING LAPAROSCOPIC (IRRIGATION / IRRIGATOR) ×2 IMPLANT
SET TUBE SMOKE EVAC HIGH FLOW (TUBING) ×2 IMPLANT
SHEARS HARMONIC ACE PLUS 45CM (MISCELLANEOUS) ×2 IMPLANT
SLEEVE XCEL OPT CAN 5 100 (ENDOMECHANICALS) ×2 IMPLANT
SOL ANTI FOG 6CC (MISCELLANEOUS) ×1 IMPLANT
SOLUTION ANTI FOG 6CC (MISCELLANEOUS) ×1
SPONGE GAUZE 2X2 STER 10/PKG (GAUZE/BANDAGES/DRESSINGS) ×1
SPONGE LAP 18X18 RF (DISPOSABLE) IMPLANT
STAPLER ECHELON BIOABSB 60 FLE (MISCELLANEOUS) IMPLANT
STAPLER ECHELON LONG 60 440 (INSTRUMENTS) ×2 IMPLANT
STAPLER RELOAD BLUE 60MM (STAPLE) ×4
STAPLER RELOAD GOLD 60MM (STAPLE) ×2
STAPLER RELOAD WHITE 60MM (STAPLE) ×4
STRIP CLOSURE SKIN 1/2X4 (GAUZE/BANDAGES/DRESSINGS) ×12 IMPLANT
SUCTION POOLE HANDLE (INSTRUMENTS)
SURGILUBE 2OZ TUBE FLIPTOP (MISCELLANEOUS) ×1 IMPLANT
SUT MNCRL AB 4-0 PS2 18 (SUTURE) ×3 IMPLANT
SUT PDS AB 1 CTX 36 (SUTURE) IMPLANT
SUT RELOAD ENDO STITCH 2 48X1 (ENDOMECHANICALS) ×5
SUT RELOAD ENDO STITCH 2.0 (ENDOMECHANICALS) ×5
SUT SILK 2 0 (SUTURE)
SUT SILK 2 0 SH CR/8 (SUTURE) IMPLANT
SUT SILK 2-0 18XBRD TIE 12 (SUTURE) IMPLANT
SUT SILK 3 0 (SUTURE)
SUT SILK 3 0 SH CR/8 (SUTURE) IMPLANT
SUT SILK 3-0 18XBRD TIE 12 (SUTURE) IMPLANT
SUT SURGIDAC NAB ES-9 0 48 120 (SUTURE) ×1 IMPLANT
SUT VIC AB 2-0 SH 27 (SUTURE) ×2
SUT VIC AB 2-0 SH 27X BRD (SUTURE) ×1 IMPLANT
SUT VICRYL 2 0 18  UND BR (SUTURE)
SUT VICRYL 2 0 18 UND BR (SUTURE) IMPLANT
SUTURE RELOAD END STTCH 2 48X1 (ENDOMECHANICALS) ×5 IMPLANT
SUTURE RELOAD ENDO STITCH 2.0 (ENDOMECHANICALS) ×5 IMPLANT
SYR 20ML LL LF (SYRINGE) ×3 IMPLANT
TOWEL OR 17X26 10 PK STRL BLUE (TOWEL DISPOSABLE) ×4 IMPLANT
TOWEL OR NON WOVEN STRL DISP B (DISPOSABLE) ×2 IMPLANT
TRAY FOLEY MTR SLVR 16FR STAT (SET/KITS/TRAYS/PACK) ×1 IMPLANT
TROCAR BLADELESS OPT 5 100 (ENDOMECHANICALS) ×2 IMPLANT
TROCAR UNIVERSAL OPT 12M 100M (ENDOMECHANICALS) ×6 IMPLANT
TROCAR XCEL 12X100 BLDLESS (ENDOMECHANICALS) ×2 IMPLANT
TUBING CONNECTING 10 (TUBING) ×3 IMPLANT
YANKAUER SUCT BULB TIP NO VENT (SUCTIONS) IMPLANT

## 2020-12-17 NOTE — Op Note (Signed)
Preoperative diagnosis: Roux-en-Y gastric bypass  Postoperative diagnosis: Same   Procedure: Upper endoscopy   Surgeon: Gurney Maxin, M.D.  Anesthesia: Gen.   Indications for procedure: This patient was undergoing a Roux-en-Y gastric bypass.   Description of procedure: In order to identify the GE junction, after hiatal dissection, endoscopy was performed. The endoscopy was place in the mouth and into the oropharynx and under endoscopic vision it was advanced to the esophagogastric junction. This appeared right at the hiatus by laparoscopy. The endoscope was removed.   After anastomosis of GJ, endoscopy was performed. The endoscopy was placed in the mouth and into the oropharynx and under endoscopic vision it was advanced to the esophagogastric junction. The pouch was insufflated and no bleeding or bubbles were seen. The GEJ was identified at 39 cm from the teeth. The anastomosis was widely patent at 46 cm. No bleeding or leaks were detected. The scope was withdrawn without difficulty.   Gurney Maxin, M.D. General, Bariatric, & Minimally Invasive Surgery [facility] [place]

## 2020-12-17 NOTE — Interval H&P Note (Signed)
History and Physical Interval Note:  12/17/2020 7:23 AM  Nathaniel Curry  has presented today for surgery, with the diagnosis of morbid obesity.  The various methods of treatment have been discussed with the patient and family. After consideration of risks, benefits and other options for treatment, the patient has consented to  Procedure(s): LAPAROSCOPIC ROUX-EN-Y GASTRIC BYPASS WITH UPPER ENDOSCOPY -CONVERSION FROM LAP SLEEVE GASTRECTOMY (N/A) HERNIA REPAIR HIATAL (N/A) as a surgical intervention.  The patient's history has been reviewed, patient examined, no change in status, stable for surgery.  I have reviewed the patient's chart and labs.  Questions were answered to the patient's satisfaction.    [name]. [name], MD, FACS General, Bariatric, & Minimally Invasive Surgery [facility] [place]  [place]rel

## 2020-12-17 NOTE — Progress Notes (Signed)
Discussed post op day goals with patient including ambulation, IS, diet progression, pain, and nausea control.  BSTOP education provided including BSTOP information guide, "Guide for Pain Management after your Bariatric Procedure".  Questions answered. 

## 2020-12-17 NOTE — Anesthesia Postprocedure Evaluation (Signed)
Anesthesia Post Note  Patient: Nathaniel Curry  Procedure(s) Performed: LAPAROSCOPIC ROUX-EN-Y GASTRIC BYPASS WITH UPPER ENDOSCOPY -CONVERSION FROM LAP SLEEVE GASTRECTOMY, Hiatal Hernia Repair (N/A ) HERNIA REPAIR HIATAL (N/A )     Patient location during evaluation: PACU Anesthesia Type: General Level of consciousness: awake and alert, oriented and patient cooperative Pain management: pain level controlled Vital Signs Assessment: post-procedure vital signs reviewed and stable Respiratory status: spontaneous breathing, nonlabored ventilation and respiratory function stable Cardiovascular status: blood pressure returned to baseline and stable Postop Assessment: no apparent nausea or vomiting Anesthetic complications: no   No complications documented.  Last Vitals:  Vitals:   12/17/20 1209 12/17/20 1230  BP:  136/85  Pulse: (!) 59 (!) 58  Resp: 16 20  Temp:    SpO2: 95% 95%    Last Pain:  Vitals:   12/17/20 1206  PainSc: [place]

## 2020-12-17 NOTE — Progress Notes (Signed)
PHARMACY CONSULT FOR:  Risk Assessment for Post-Discharge VTE Following Bariatric Surgery  Post-Discharge VTE Risk Assessment: This patient's probability of 30-day post-discharge VTE is increased due to the factors marked: X  Male    Age >/=60 years  X  BMI >/=50 kg/m2    CHF    Dyspnea at Rest    Paraplegia  X  Non-gastric-band surgery    Operation Time >/=3 hr    Return to OR     Length of Stay >/= 3 d   Hx of VTE   Hypercoagulable condition   Significant venous stasis       Predicted probability of 30-day post-discharge VTE: 0.51 %  Other patient-specific factors to consider: OR time 2 minutes shy of 3 hours  Recommendation for Discharge: Enoxaparin 60 mg [place] q12h x 2 weeks post-discharge    Nathaniel Curry is a 40 y.o. male who underwent LAPAROSCOPIC ROUX-EN-Y GASTRIC BYPASS WITH UPPER ENDOSCOPY -CONVERSION FROM LAP SLEEVE GASTRECTOMY, HERRNIA REPAIR HIATAL  12/17/2020    No Known Allergies  Patient Measurements: Height: 5\' 8"  (172.7 cm) Weight: (!) 155.9 kg (343 lb 9.6 oz) IBW/kg (Calculated) : 68.4 Body mass index is 52.24 kg/m.  No results for input(s): WBC, HGB, HCT, PLT, APTT, CREATININE, LABCREA, CREATININE, CREAT24HRUR, MG, PHOS, ALBUMIN, PROT, ALBUMIN, AST, ALT, ALKPHOS, BILITOT, BILIDIR, IBILI in the last 72 hours. Estimated Creatinine Clearance: 114.4 mL/min (A) (by C-G formula based on SCr of 1.28 mg/dL (H)).    Past Medical History:  Diagnosis Date  . Allergy   . Anemia   . Anxiety   . Barrett's esophagus   . Diverticulosis 01/11/2016  . Family history of breast cancer   . Family history of colon cancer 10/07/2018  . Family history of melanoma   . Family history of uterine cancer   . GERD (gastroesophageal reflux disease)   . Gout   . Gout   . History of kidney stones   . Hypertension   . Iron deficiency anemia 05/28/2017  . Nephrolithiasis 02/08/2014  . Obesity      Medications Prior to Admission  Medication Sig Dispense Refill Last Dose   . ALPRAZolam (XANAX) 0.5 MG tablet Take 0.5 mg by mouth 2 (two) times daily as needed for anxiety.   Past Week at Unknown time  . amlodipine-olmesartan (AZOR) 10-20 MG tablet Take 1 tablet by mouth daily. 90 tablet 3 12/16/2020 at Unknown time  . famotidine (PEPCID) 20 MG tablet Take 20 mg by mouth 2 (two) times daily.   12/17/2020 at 0400  . indapamide (LOZOL) 2.5 MG tablet Take 1 tablet (2.5 mg total) by mouth daily. 90 tablet 1 12/16/2020 at Unknown time  . metoprolol succinate (TOPROL-XL) 50 MG 24 hr tablet Take 50 mg by mouth daily.   12/17/2020 at 0400  . pantoprazole (PROTONIX) 40 MG tablet Take 40 mg by mouth 2 (two) times daily.    12/17/2020 at 0400  . sertraline (ZOLOFT) 50 MG tablet Take 50 mg by mouth daily.   12/16/2020 at Unknown time  . sucralfate (CARAFATE) 1 g tablet Take 1 g by mouth 4 (four) times daily -  with meals and at bedtime.   12/16/2020 at Unknown time  . hydrOXYzine (VISTARIL) 25 MG capsule Take 1 capsule (25 mg total) by mouth at bedtime as needed for anxiety. 30 capsule 1 More than a month at Unknown time     Nathaniel Curry, Pharm.D 12/17/2020 11:43 AM

## 2020-12-17 NOTE — Transfer of Care (Signed)
Immediate Anesthesia Transfer of Care Note  Patient: Nathaniel Curry  Procedure(s) Performed: LAPAROSCOPIC ROUX-EN-Y GASTRIC BYPASS WITH UPPER ENDOSCOPY -CONVERSION FROM LAP SLEEVE GASTRECTOMY, Hiatal Hernia Repair (N/A ) HERNIA REPAIR HIATAL (N/A )  Patient Location: PACU  Anesthesia Type:General  Level of Consciousness: awake, alert  and oriented  Airway & Oxygen Therapy: Patient Spontanous Breathing and Patient connected to face mask oxygen  Post-op Assessment: Report given to RN and Post -op Vital signs reviewed and stable  Post vital signs: Reviewed and stable  Last Vitals:  Vitals Value Taken Time  BP    Temp    Pulse 70 12/17/20 1116  Resp 16 12/17/20 1116  SpO2 100 % 12/17/20 1116  Vitals shown include unvalidated device data.  Last Pain:  Vitals:   12/17/20 0618  PainSc: 0-No pain         Complications: No complications documented.

## 2020-12-17 NOTE — Op Note (Signed)
Nathaniel Curry [id number] 18-Apr-1981. 12/17/2020  Preoperative diagnosis:    Essential (primary) hypertension   GERD with large hiatal hernia (gastroesophageal reflux disease)   History of gout   Family history of breast cancer   Family history of colon cancer   Cervical radiculopathy   S/P laparoscopic sleeve gastrectomy 2014  Postoperative  diagnosis:  1. same  Surgical procedure: Laparoscopic Repair of Hiatal hernia along with Roux-en-Y gastric bypass (ante-colic, ante-gastric); upper endoscopy  Surgeon: Gayland Curry, M.D. FACS  Asst.: Gurney Maxin MD FACS  Anesthesia: General plus exparel/marcaine mix  Complications: None   EBL: Minimal   Drains: None   Disposition: PACU in good condition   Indications for procedure: 40 y.o. yo male with severe obesity who previously underwent a laparoscopic sleeve gastrectomy in 2014 at outside hospital who unfortunately developed persistent GERD.  His work-up revealed a moderate hiatal hernia.  A portion of his sleeve had herniated into his chest.  Medications were no longer effective for controlling his severe heartburn.  He had an extensive work-up and evaluation and recommendation was to repair his hiatal hernia with conversion to Roux-en-Y gastric bypass.  The patient's comorbidities are listed above. We discussed the risk and benefits of surgery including but not limited to anesthesia risk, bleeding, infection, blood clot formation, anastomotic leak, anastomotic stricture, ulcer formation, death, respiratory complications, intestinal blockage, internal hernia, gallstone formation, vitamin and nutritional deficiencies, injury to surrounding structures, failure to lose weight and mood changes.   Description of procedure: Patient is brought to the operating room and general anesthesia induced. The patient had received preoperative broad-spectrum IV antibiotics and subcutaneous heparin. The abdomen was widely sterilely prepped with Chloraprep  and draped. Patient timeout was performed and correct patient and procedure confirmed. Access was obtained with a 12 mm Optiview trocar in the left upper quadrant and pneumoperitoneum established without difficulty. Under direct vision 12 mm trocars were placed laterally in the right upper quadrant, right upper quadrant midclavicular line, and to the left and above the umbilicus for the camera port. A 5 mm trocar was placed laterally in the left upper quadrant.  Exparel/marcaine mix was infiltrated in bilateral lateral abdominal walls as a TAP block.  We first decided to address his hiatal hernia.  Patient was placed in reverse Trendelenburg.  A 5 mm trocar was placed in the subxiphoid position to pass a Nathanson liver retractor to lift up the left lobe of the liver.  This revealed a moderate sized hiatal hernia with a portion of the sleeve herniated above the diaphragm.  Pars flaccida had not been previously taken down.  We incised the pars flaccida with harmonic scalpel.  Identified the right crus of the diaphragm.  I was then able to incise the peritoneum over the right crus of the diaphragm and then I gently dissected along the right crus of the diaphragm while my assistant was holding traction laterally toward the patient's left side.  Dissection was done with a combination of Prestige grasper as well as with laparoscopic Kitner's.  I then continued the dissection anteriorly taking down some of the peritoneum and scar tissue anteriorly.  We then identified the left crus of the diaphragm.  I was able to identify the confluence of the left and right crus.  I was able to pass a Building surveyor.  We placed a Penrose drain around the upper sleeve and my assistant retracted on the sleeve.  I was unable to do posterior dissection using a combination of  harmonic scalpel as well as blunt dissection.  I was able to perform a circumferential mobilization dissection around the proximal sleeve and distal  esophagus.  It took about 40 minutes to circumferentially dissect and mobilize the sleeve out of the chest.  It appeared that we had the GE junction below the diaphragm.  My assistant scrubbed out and performed an upper endoscopy.  The Z-line was still probably about half a centimeter above the diaphragm.  He scrubbed back in and we continued some additional mediastinal circumferential mobilization.  The pleura was not violated.  We were able to mobilize an additional 1 cm.  The Z-line was now below the diaphragm by about 2 cm.  The sleeve itself has been completely reduced back into the abdomen.  We then closed the hiatus with 3 interrupted 0 Ethibond Surgidac sutures each secured with a titanium tie knot.  Then using a ruler I measured about 5-1/2 cm to 6 cm from the Z-line and identified an area along the lesser curve of the sleeve to get into the retrogastric space.  This was taken down with harmonic scalpel.  I was able to get behind the sleeve in this location.  I was able to pass a Prestige grasper.  We had previously taken down some the omentum that had really adhered along the patient's left side of the sleeve.  I confirmed that there was no clips along the planned transection line the proximal sleeve.  I then stapled across the sleeve with a single fire of an Echelon Ethicon 60 mm stapler with a gold load.  This completely divided the sleeve.  This left about a 5-1/2 to 6 cm long proximal pouch.  We then went down to perform the jejunojejunostomy.    The omentum was brought into the upper abdomen and the transverse mesocolon elevated and the ligament of Treitz clearly identified. A 40 cm biliopancreatic limb was then carefully measured from the ligament of Treitz. The small intestine was divided at this point with a single firing of the white load linear stapler. A Penrose drain was sutured to the end of the Roux-en-Y limb for later identification. A 100 cm Roux-en-Y limb was then carefully  measured. At this point a side-to-side anastomosis was created between the Roux limb and the end of the biliopancreatic limb. This was accomplished with a single firing of the 60 mm white load linear stapler. The common enterotomy was closed with a running 2-0 Vicryl begun at either end of the enterotomy and tied centrally. Vistaseal tissue sealant was placed over the anastomosis. The mesenteric defect was then closed with running 2-0 silk. The omentum was then divided with the harmonic scalpel up towards the transverse colon to allow mobility of the Roux limb toward the gastric pouch. The patient was then placed in steep reversed Trendelenburg.    The Roux limb was then brought up in an antecolic fashion with the candycane facing to the patient's left without undue tension. The gastrojejunostomy was created with an initial posterior row of 2-0 Vicryl between the Roux limb and the staple line of the gastric pouch. Enterotomies were then made in the gastric pouch and the Roux limb with the harmonic scalpel and at approximately 2-2-1/2 cm anastomosis was created with a single firing of the 24mm blue load linear stapler. The staple line was inspected and was intact without bleeding. The common enterotomy was then closed with running 2-0 Vicryl begun at either end and tied centrally. The Ewall tube was then  easily passed through the anastomosis and an outer anterior layer of running 2-0 Vicryl was placed. The Ewald tube was removed. With the outlet of the gastrojejunostomy clamped and under saline irrigation the assistant performed upper endoscopy and with the gastric pouch tensely distended with air-there was no evidence of leak on this test. The pouch was desufflated. The Terance Hart defect was closed with running 2-0 silk. The abdomen was inspected for any evidence of bleeding or bowel injury and everything looked fine. The Nathanson retractor was removed under direct vision after coating the anastomosis with  vistaseal tissue sealant. All CO2 was evacuated and trochars removed. Skin incisions were closed with 4-0 monocryl in a subcuticular fashion followed by benzoin, steri-strips and bandages. Sponge needle and instrument counts were correct. The patient was taken to the PACU in good condition.      [name]. [name], MD, FACS General, Bariatric, & Minimally Invasive Surgery [facility], [place]

## 2020-12-17 NOTE — Anesthesia Procedure Notes (Signed)
Procedure Name: Intubation Date/Time: 12/17/2020 7:34 AM Performed by: Sharlette Dense, CRNA Patient Re-evaluated:Patient Re-evaluated prior to induction Oxygen Delivery Method: Circle system utilized Preoxygenation: Pre-oxygenation with 100% oxygen Induction Type: IV induction, Rapid sequence and Cricoid Pressure applied Laryngoscope Size: Miller and 3 Grade View: Grade I Tube type: Oral Tube size: 8.0 mm Number of attempts: 1 Airway Equipment and Method: Stylet Placement Confirmation: ETT inserted through vocal cords under direct vision,  positive ETCO2 and breath sounds checked- equal and bilateral Secured at: 22 cm Tube secured with: Tape Dental Injury: Teeth and Oropharynx as per pre-operative assessment

## 2020-12-18 ENCOUNTER — Encounter (HOSPITAL_COMMUNITY): Payer: Self-pay | Admitting: General Surgery

## 2020-12-18 ENCOUNTER — Other Ambulatory Visit (HOSPITAL_COMMUNITY): Payer: Self-pay | Admitting: General Surgery

## 2020-12-18 DIAGNOSIS — Z9884 Bariatric surgery status: Secondary | ICD-10-CM

## 2020-12-18 LAB — COMPREHENSIVE METABOLIC PANEL
ALT: 242 U/L — ABNORMAL HIGH (ref 0–44)
AST: 265 U/L — ABNORMAL HIGH (ref 15–41)
Albumin: 3.6 g/dL (ref 3.5–5.0)
Alkaline Phosphatase: 60 U/L (ref 38–126)
Anion gap: 10 (ref 5–15)
BUN: 13 mg/dL (ref 6–20)
CO2: 25 mmol/L (ref 22–32)
Calcium: 9 mg/dL (ref 8.9–10.3)
Chloride: 101 mmol/L (ref 98–111)
Creatinine, Ser: 1.07 mg/dL (ref 0.61–1.24)
GFR, Estimated: >60 mL/min (ref >60)
Glucose, Bld: 139 mg/dL — ABNORMAL HIGH (ref 70–99)
Potassium: 3.9 mmol/L (ref 3.5–5.1)
Sodium: 136 mmol/L (ref 135–145)
Total Bilirubin: 1 mg/dL (ref 0.3–1.2)
Total Protein: 7.2 g/dL (ref 6.5–8.1)

## 2020-12-18 LAB — CBC WITH DIFFERENTIAL/PLATELET
Abs Immature Granulocytes: 0.07 10*3/uL (ref 0.00–0.07)
Basophils Absolute: 0 10*3/uL (ref 0.0–0.1)
Basophils Relative: 0 %
Eosinophils Absolute: 0 10*3/uL (ref 0.0–0.5)
Eosinophils Relative: 0 %
HCT: 41.4 % (ref 39.0–52.0)
Hemoglobin: 13 g/dL (ref 13.0–17.0)
Immature Granulocytes: 1 %
Lymphocytes Relative: 10 %
Lymphs Abs: 1.4 10*3/uL (ref 0.7–4.0)
MCH: 25 pg — ABNORMAL LOW (ref 26.0–34.0)
MCHC: 31.4 g/dL (ref 30.0–36.0)
MCV: 79.8 fL — ABNORMAL LOW (ref 80.0–100.0)
Monocytes Absolute: 1 10*3/uL (ref 0.1–1.0)
Monocytes Relative: 7 %
Neutro Abs: 12.4 10*3/uL — ABNORMAL HIGH (ref 1.7–7.7)
Neutrophils Relative %: 82 %
Platelets: 344 10*3/uL (ref 150–400)
RBC: 5.19 MIL/uL (ref 4.22–5.81)
RDW: 14.9 % (ref 11.5–15.5)
WBC: 14.9 10*3/uL — ABNORMAL HIGH (ref 4.0–10.5)
nRBC: 0 % (ref 0.0–0.2)

## 2020-12-18 MED ORDER — ACETAMINOPHEN 500 MG PO TABS: 1000.0000 mg | ORAL_TABLET | Freq: Three times a day (TID) | ORAL | 0 refills | Status: AC

## 2020-12-18 MED ORDER — GABAPENTIN 100 MG PO CAPS: 200.0000 mg | ORAL_CAPSULE | Freq: Two times a day (BID) | ORAL | 0 refills | Status: DC

## 2020-12-18 MED ORDER — ENOXAPARIN SODIUM 60 MG/0.6ML ~~LOC~~ SOLN: 60.0000 mg | Freq: Two times a day (BID) | SUBCUTANEOUS | 0 refills | Status: DC

## 2020-12-18 MED ORDER — ONDANSETRON 4 MG PO TBDP: 4.0000 mg | ORAL_TABLET | Freq: Four times a day (QID) | ORAL | 0 refills | Status: DC | PRN

## 2020-12-18 MED ORDER — METOPROLOL TARTRATE 25 MG PO TABS: 25.0000 mg | ORAL_TABLET | Freq: Two times a day (BID) | ORAL | 3 refills | Status: DC

## 2020-12-18 MED ORDER — TRAMADOL HCL 50 MG PO TABS: 50.0000 mg | ORAL_TABLET | Freq: Four times a day (QID) | ORAL | 0 refills | Status: DC | PRN

## 2020-12-18 NOTE — Progress Notes (Signed)
Patient was given discharge instructions, and all questions were answered. Patient was stable for discharge and was walked to the main exit. 

## 2020-12-18 NOTE — Discharge Instructions (Signed)
MONITOR YOUR BLOOD PRESSURE - MAY NEED BLOOD PRESSURE MEDICATION DOSAGE CHANGES AFTER SURGERY  GASTRIC BYPASS / [place] Instructions  These instructions are to help you care for yourself when you go home.  Call: If you have any problems. . Call [phone] and ask for the surgeon on call . If you have an emergency related to your surgery please use the ER at [facility].  . Tell the ER staff that you are a new post-op gastric bypass or gastric sleeve patient   Signs and symptoms to report: . Severe vomiting or nausea o If you cannot handle clear liquids for longer than 1 day, call your surgeon  . Abdominal pain which does not get better after taking your pain medication . Fever greater than 100.4 F and chills . Heart rate over 100 beats a minute . Trouble breathing . Chest pain .  Redness, swelling, drainage, or foul odor at incision (surgical) sites .  If your incisions open or pull apart . Swelling or pain in calf (lower leg) . Diarrhea (Loose bowel movements that happen often), frequent watery, uncontrolled bowel movements . Constipation, (no bowel movements for 3 days) if this happens:  o Take Milk of Magnesia, 2 tablespoons by mouth, 3 times a day for 2 days if needed o Stop taking Milk of Magnesia once you have had a bowel movement o Call your doctor if constipation continues Or o Take Miralax  (instead of Milk of Magnesia) following the label instructions o Stop taking Miralax once you have had a bowel movement o Call your doctor if constipation continues . Anything you think is "abnormal for you"   Normal side effects after surgery: . Unable to sleep at night or unable to concentrate . Irritability . Being tearful (crying) or depressed These are common complaints, possibly related to your anesthesia, stress of surgery and change in lifestyle, that usually go away a few weeks after surgery.  If these feelings continue, call your medical doctor.  Wound Care: You  may have surgical glue, steri-strips, or staples over your incisions after surgery . Surgical glue:  Looks like a clear film over your incisions and will wear off a little at a time . Steri-strips : Adhesive strips of tape over your incisions. You may notice a yellowish color on the skin under the steri-strips. This is used to make the   steri-strips stick better. Do not pull the steri-strips off - let them fall off . Staples: Jodell Cipro may be removed before you leave the hospital o If you go home with staples, call [facility] at for an appointment with your surgeon's nurse to have staples removed 10 days after surgery, [phone] . Showering: You may shower two (2) days after your surgery unless your surgeon tells you differently o Wash gently around incisions with warm soapy water, rinse well, and gently pat dry  o If you have a drain (tube from your incision), you may need someone to hold this while you shower  o No tub baths until staples are removed and incisions are healed     Medications: Marland Kitchen Medications should be liquid or crushed if larger than the size of a dime . Extended release pills (medication that releases a little bit at a time through the day) should not be crushed . Depending on the size and number of medications you take, you may need to space (take a few throughout the day)/change the time you take your medications so  that you do not over-fill your pouch (smaller stomach) . Make sure you follow-up with your primary care physician to make medication changes needed during rapid weight loss and life-style changes . If you have diabetes, follow up with the doctor that orders your diabetes medication(s) within one week after surgery and check your blood sugar regularly. . Do not drive while taking narcotics (pain medications) . DO NOT take NSAID'S (Examples of NSAID's include ibuprofen, naproxen)  Diet:                    First 2 Weeks  You will see the nutritionist  about two (2) weeks after your surgery. The nutritionist will increase the types of foods you can eat if you are handling liquids well: Marland Kitchen If you have severe vomiting or nausea and cannot handle clear liquids lasting longer than 1 day, call your surgeon  Protein Shake . Drink at least 2 ounces of shake 5-6 times per day . Each serving of protein shakes (usually 8 - 12 ounces) should have a minimum of:  o 15 grams of protein  o And no more than 5 grams of carbohydrate  . Goal for protein each day: o Men = 80 grams per day o Women = 60 grams per day . Protein powder may be added to fluids such as non-fat milk or Lactaid milk or Soy milk (limit to 35 grams added protein powder per serving)  Hydration . Slowly increase the amount of water and other clear liquids as tolerated (See Acceptable Fluids) . Slowly increase the amount of protein shake as tolerated  .  Sip fluids slowly and throughout the day . May use sugar substitutes in small amounts (no more than 6 - 8 packets per day; i.e. Splenda)  Fluid Goal . The first goal is to drink at least 8 ounces of protein shake/drink per day (or as directed by the nutritionist);  See handout from pre-op Bariatric Education Class for examples of protein shake/drink.   o Slowly increase the amount of protein shake you drink as tolerated o You may find it easier to slowly sip shakes throughout the day o It is important to get your proteins in first . Your fluid goal is to drink 64 - 100 ounces of fluid daily o It may take a few weeks to build up to this . 32 oz (or more) should be clear liquids  And  . 32 oz (or more) should be full liquids (see below for examples) . Liquids should not contain sugar, caffeine, or carbonation  Clear Liquids: . Water or Sugar-free flavored water (i.e. Fruit H2O, Propel) . Decaffeinated coffee or tea (sugar-free) . [employer], [employer], Minute [facility] . Sugar-free Jell-O . Bouillon or broth . Sugar-free  Popsicle:   *Less than 20 calories each; Limit 1 per day  Full Liquids: Protein Shakes/Drinks + 2 choices per day of other full liquids . Full liquids must be: o No More Than 12 grams of Carbs per serving  o No More Than 3 grams of Fat per serving . Strained low-fat cream soup . Non-Fat milk . Fat-free Lactaid Milk . Sugar-free yogurt (Dannon Lite & Fit, Greek yogurt)      Vitamins and Minerals . Start 1 day after surgery unless otherwise directed by your surgeon . Bariatric Specific Complete Multivitamins . Chewable Calcium Citrate with Vitamin D-3 (Example: 3 Chewable Calcium Plus 600 with Vitamin D-3) o Take 500 mg three (3) times a day  for a total of 1500 mg each day o Do not take all 3 doses of calcium at one time as it may cause constipation, and you can only absorb 500 mg  at a time  o Do not mix multivitamins containing iron with calcium supplements; take 2 hours apart  . Menstruating women and those at risk for anemia (a blood disease that causes weakness) may need extra iron o Talk with your doctor to see if you need more iron . If you need extra iron: Total daily Iron recommendation (including Vitamins) is 50 to 100 mg Iron/day . Do not stop taking or change any vitamins or minerals until you talk to your nutritionist or surgeon . Your nutritionist and/or surgeon must approve all vitamin and mineral supplements   Activity and Exercise: It is important to continue walking at home.  Limit your physical activity as instructed by your doctor.  During this time, use these guidelines: . Do not lift anything greater than ten (10) pounds for at least two (2) weeks . Do not go back to work or drive until Engineer, production says you can . You may have sex when you feel comfortable  o It is VERY important for male patients to use a reliable birth control method; fertility often increases after surgery  o Do not get pregnant for at least 18 months . Start exercising as soon as your  doctor tells you that you can o Make sure your doctor approves any physical activity . Start with a simple walking program . Walk 5-15 minutes each day, 7 days per week.  . Slowly increase until you are walking 30-45 minutes per day Consider joining our [place] program. [phone] or email [email or web address]   Special Instructions Things to remember:  . Use your CPAP when sleeping if this applies to you, do not stop the use of CPAP unless directed by physician after a sleep study . [facility] has a free Bariatric Surgery Support Group that meets monthly, the 3rd Thursday, 6 pm.  Please review discharge information for date and location of this meeting. . It is very important to keep all follow up appointments with your surgeon, nutritionist, primary care physician, and behavioral health practitioner o After the first year, please follow up with your bariatric surgeon and nutritionist at least once a year in order to maintain best weight loss results   [facility]: [place]: [phone] Bariatric Nurse Coordinator: [phone]

## 2020-12-18 NOTE — Discharge Summary (Signed)
Physician Discharge Summary  Nathaniel Curry DXA:[id number] DOB: [birth year] DOA: 12/17/2020  PCP: Rusty Aus, MD  Admit date: 12/17/2020 Discharge date: 12/18/2020  Recommendations for Outpatient Follow-up:     Follow-up Information    Greer Pickerel, MD. Go on 02/06/2021.   Specialty: General Surgery Why: at 9:30am.  Please arrive 15 minutes prior to your appointment time.  Thank you. Contact information: [address] [place] [address] [phone]        Carlena Hurl, PA-C. Go on 01/08/2021.   Specialty: General Surgery Why: at 4:00pm for Dr. [name].  Please arrive 15 minutes prior to your appointment time.  Thank you. 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 Please see operative note. Preoperatively the patient was given 5000 units of subcutaneous heparin for DVT prophylaxis. ERAS protocol was used. Postoperative prophylactic Lovenox dosing was started on the evening of postoperative day 0.  The patient was started on ice chips and water on the evening of POD 0 which they tolerated. On  postoperative day 1 The patient's diet was advanced to protein shakes which they also tolerated. On POD 1, The patient was ambulating without difficulty. Their vital signs are stable without fever or tachycardia. Their hemoglobin had remained stable. The patient had received discharge instructions and counseling. They were deemed stable for discharge.  He was recommended to go home on 2 weeks of lovenox for DVT prophylaxis  BP 121/72 (BP Location: Right Arm)   Pulse (!) 52   Temp 97.6 F (36.4 C) (Oral)   Resp 18   Ht 5\' 8"  (1.727 m)   Wt (!) 155.9 kg   SpO2 97%   BMI 52.24 kg/m   Gen: alert, NAD, non-toxic appearing Pupils: equal, no scleral icterus Pulm: Lungs clear to auscultation, symmetric chest rise CV: regular rate and rhythm Abd: soft, min tender, nondistended. No cellulitis. No incisional hernia Ext: no edema, no calf tenderness Skin: no rash, no jaundice  Discharge Instructions  Discharge Instructions    Ambulate hourly while awake   Complete by: As directed    Call MD for:  difficulty breathing, headache or visual disturbances   Complete by: As directed    Call MD for:  persistant dizziness or light-headedness   Complete by: As directed    Call MD for:  persistant nausea and vomiting   Complete by: As directed    Call MD for:  redness, tenderness, or signs of infection (pain, swelling, redness, odor or green/yellow discharge around incision site)   Complete by: As directed    Call MD for:  severe uncontrolled pain   Complete by: As directed    Call MD for:  temperature >101 F   Complete by: As directed    Diet bariatric full liquid   Complete by: As directed    Discharge instructions   Complete by: As directed    See bariatric discharge instructions   Incentive spirometry   Complete by: As directed    Perform hourly while awake     Allergies as of 12/18/2020   No Known Allergies     Medication List    STOP taking these medications   famotidine 20 MG  tablet Commonly known as: PEPCID   metoprolol succinate 50 MG 24 hr tablet Commonly known as: TOPROL-XL   sucralfate 1 g tablet Commonly known as: CARAFATE     TAKE these medications   acetaminophen 500 MG tablet Commonly known as: TYLENOL Take 2 tablets (1,000 mg total) by mouth every 8 (eight) hours for 5 days.   ALPRAZolam 0.5 MG tablet Commonly known as: XANAX Take 0.5 mg by mouth 2 (two) times daily as needed for anxiety.   amlodipine-olmesartan 10-20 MG tablet Commonly known as: AZOR Take 1 tablet by mouth daily. Notes to patient: Monitor Blood Pressure Daily and keep a log for primary care physician.  You may need to make changes to your medications with rapid weight loss.     enoxaparin 60 MG/0.6ML injection Commonly known as: LOVENOX Inject 0.6 mLs (60 mg total) into the skin every 12 (twelve) hours for 14 days.   gabapentin 100 MG capsule Commonly known as: NEURONTIN Take 2 capsules (200 mg total) by mouth every 12 (twelve) hours.   hydrOXYzine 25 MG capsule Commonly known as: VISTARIL Take 1 capsule (25 mg total) by mouth at bedtime as needed for anxiety.   indapamide 2.5 MG tablet Commonly known as: LOZOL Take 1 tablet (2.5 mg total) by mouth daily. Notes to patient: Monitor Blood Pressure Daily and keep a log for primary care physician.  Monitor for symptoms of dehydration.  You may need to make changes to your medications with rapid weight loss.     metoprolol tartrate 25 MG tablet Commonly known as: LOPRESSOR Take 1 tablet (25 mg total) by mouth 2 (two) times daily.   ondansetron 4 MG disintegrating tablet Commonly known as: ZOFRAN-ODT Take 1 tablet (4 mg total) by mouth every 6 (six) hours as needed for nausea or vomiting.   pantoprazole 40 MG tablet Commonly known as: PROTONIX Take 40 mg by mouth 2 (two) times daily.   sertraline 50 MG tablet Commonly known as: ZOLOFT Take 50 mg by mouth daily.   traMADol 50 MG tablet Commonly known as:  ULTRAM Take 1 tablet (50 mg total) by mouth every 6 (six) hours as needed (pain).       Follow-up Information    Greer Pickerel, MD. Go on 02/06/2021.   Specialty: General Surgery Why: at 9:30am.  Please arrive 15 minutes prior to your appointment time.  Thank you. Contact information: [address] [place] [address] [phone]        Carlena Hurl, PA-C. Go on 01/08/2021.   Specialty: General Surgery Why: at 4:00pm for Dr. [name].  Please arrive 15 minutes prior to your appointment time.  Thank you. Contact information: [address] [place]  [address] [phone]                The results of significant diagnostics from this hospitalization (including imaging,  microbiology, ancillary and laboratory) are listed below for reference.    Significant Diagnostic Studies: No results found.  Labs: Basic Metabolic Panel: Recent Labs  Lab 12/18/20 0431  NA 136  K 3.9  CL 101  CO2 25  GLUCOSE 139*  BUN 13  CREATININE 1.07  CALCIUM 9.0   Liver Function Tests: Recent Labs  Lab 12/18/20 0431  AST 265*  ALT 242*  ALKPHOS 60  BILITOT 1.0  PROT 7.2  ALBUMIN 3.6    CBC: Recent Labs  Lab 12/17/20 1354 12/18/20 0431  WBC  --  14.9*  NEUTROABS  --  12.4*  HGB 14.1 13.0  HCT 45.2 41.4  MCV  --  79.8*  PLT  --  344    CBG: No results for input(s): GLUCAP in the last 168 hours.  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

## 2020-12-18 NOTE — Progress Notes (Signed)
Nutrition Education Note ° °Received consult for diet education for patient s/p bariatric surgery. ° °Discussed 2 week post op diet with pt. Emphasized that liquids must be non carbonated, non caffeinated, and sugar free. Fluid goals discussed. Pt to follow up with outpatient bariatric RD for further diet progression after 2 weeks. Multivitamins and minerals also reviewed. Teach back method used, pt expressed understanding, expect good compliance. ° °If nutrition issues arise, please consult RD. ° °Nathaniel Azbell, MS, RD, LDN °Inpatient Clinical Dietitian °Contact information available via Amion ° ° °

## 2020-12-18 NOTE — Progress Notes (Signed)
Patient alert and oriented, pain is controlled. Patient is tolerating fluids, advanced to protein shake today, patient is tolerating well. Reviewed Gastric Bypass discharge instructions with patient and patient is able to articulate understanding. Provided information on BELT program, Support Group and [facility] outpatient pharmacy. All questions answered, will continue to monitor.  Total 24hr fluid recall 856mL.  Per dehydration protocol, will call pt to f/u within one week post op.

## 2020-12-18 NOTE — Progress Notes (Signed)
Patient alert and oriented, Post op day 1.  Provided support and encouragement.  Encouraged pulmonary toilet, ambulation and small sips of liquids.  All questions answered.  Will continue to monitor. 

## 2020-12-18 NOTE — Consult Note (Signed)
   [facility] Inpatient Consult   12/18/2020  SAMIK BALKCOM Jul 09, 1981 [id number]    [place] [facility] [ACO] Patient: [place]  Follow up is planned by a [place] Coordinator for the [facility] plan benefits -  to follow up for support and  resource support as needed..    Plan: Patient will receive a post hospital call and will be evaluated for assessments and disease process education.       For additional questions or referrals please contact:   Natividad Brood, RN BSN [facility] Liaison  [phone] business mobile phone Toll free office [phone]  Fax number: [phone] [email or web address]

## 2020-12-19 ENCOUNTER — Other Ambulatory Visit: Payer: Self-pay | Admitting: *Deleted

## 2020-12-19 NOTE — Patient Outreach (Signed)
[place] [facility]) Care Management  12/19/2020  Nathaniel Curry [birth year] [id number]   Transition of care telephone call  Referral received:12/17/20 Initial outreach:12/19/20 Insurance: [facility]   Initial unsuccessful telephone call to patient's preferred number in order to complete transition of care assessment; no answer, left HIPAA compliant voicemail message requesting return call.   Objective: Per electronic record ,Nathaniel Curry  was hospitalized at [facility] 2/28-12/18/20 for Laparoscopic gastric bypass .  Comorbidities include: Hypertension, GERD, s/p laparoscopic sleeve gastrectomy 2014.  He was discharged to home on 12/18/20  without the need for home health services or DME.     Plan: This RNCM will route unsuccessful outreach letter with [place] Management pamphlet and 24 hour Nurse Advice Line Magnet to [place] Management clinical pool to be mailed to patient's home address. This RNCM will attempt another outreach within 4 business days.  Joylene Draft, RN, BSN  [place] Management Coordinator  [phone]- Mobile [phone]- Toll Free Main Office

## 2020-12-24 ENCOUNTER — Other Ambulatory Visit: Payer: Self-pay | Admitting: *Deleted

## 2020-12-24 ENCOUNTER — Telehealth (HOSPITAL_COMMUNITY): Payer: Self-pay | Admitting: *Deleted

## 2020-12-24 NOTE — Patient Outreach (Signed)
[place] [facility]) Care Management  12/24/2020  Nathaniel Curry 08-21-1981 [id number]   Transition of care call Referral received: 12/17/20 Initial outreach attempt: 12/19/20 Insurance: [place]    2nd unsuccessful telephone call to patient's preferred contact number in order to complete post hospital discharge transition of care assessment , no answer left HIPAA compliant message requesting return call.    Objective: Per electronic record ,Nathaniel Curry was hospitalized at [facility] 2/28-12/18/20 for Laparoscopic gastric bypass .  Comorbidities include: Hypertension, GERD, s/p laparoscopic sleeve gastrectomy 2014.  He was discharged to home on 12/18/20  without the need for home health servicesor DME.   Plan If no return call from patient will attempt 3rd outreach in the next 4 business days.   Joylene Draft, RN, BSN  [place] Management Coordinator  [phone]- Mobile [phone]- Toll Free Main Office

## 2020-12-26 ENCOUNTER — Other Ambulatory Visit: Payer: Self-pay | Admitting: Internal Medicine

## 2020-12-27 ENCOUNTER — Other Ambulatory Visit: Payer: Self-pay | Admitting: *Deleted

## 2020-12-27 NOTE — Patient Outreach (Signed)
[place] [facility]) Care Management  12/27/2020  XXAVIER NOON Sep 29, 1981 [id number]   Transition of care call Referral received: 12/17/20 Initial outreach attempt: 12/19/20 Insurance: [place] unsuccessful telephone call to patient's preferred contact number in order to complete post hospital discharge transition of care assessment; no answer, unable to leave a message, received recording unable to process call all circuit busy, attempted x 2.   Objective: Per electronic record ,[facility] .Comorbidities include: Hypertension, GERD, s/p laparoscopic sleeve gastrectomy 2014. He was discharged to home on3/1/22without the need for home health servicesor DME.    Plan: If no return call from patient,will schedule 4th call attempt in the next 3 weeks.    Joylene Draft, RN, BSN  [place] Management Coordinator  [phone]- Mobile [phone]- Toll Free Main Office

## 2021-01-01 ENCOUNTER — Ambulatory Visit: Payer: 59

## 2021-01-02 ENCOUNTER — Encounter: Payer: 59 | Attending: General Surgery | Admitting: Dietician

## 2021-01-02 ENCOUNTER — Encounter: Payer: Self-pay | Admitting: Dietician

## 2021-01-02 ENCOUNTER — Other Ambulatory Visit: Payer: Self-pay

## 2021-01-02 NOTE — Patient Instructions (Signed)
   Advance diet to include some protein foods, cooked moist and tender.   When able to eat 1-2oz of protein food at a sitting, you can add some low-carb veggies.   Continue to gradually increase physical activity as tolerated.

## 2021-01-02 NOTE — Progress Notes (Signed)
Nutrition Therapy for Post-Operative Bariatric Diet Follow-up visit:  2 weeks post-op sleeve-RYGB revision Surgery  Medical Nutrition Therapy:  Appt start time: 1330 end time:  1400.  Anthropometrics: Weight: 331.1lbs Height: 5'9"  Date 08/22/20 11/19/20 01/02/21  BMI 50.86 52.01 48.89  Weight (lbs) 344.4 352.2 331.1  Skeletal muscle (lbs)   Patient declined body comp measurement  % body fat      Clinical: Medications: ALPRAZolam, sertraline Supplementation: bariatric multivitamin 1x daily (with 45mg  Fe) + calcium 600mg  2 tabs 3x daily Health/ medical history changes: none GI symptoms: N/V/D/C: none Dumping Syndrome: no Hair loss: no  Dietary/ Lifestyle Progress: . Patient reports surgery and recovery have gone smoothly so far, denies any pain or GI distress.  Marland Kitchen He voices readiness to resume consumption of solid foods.  Marland Kitchen He feels fatigued, low energy.  . Reports stools are not well formed and would like to add some solid food to help this issue.   Dietary recall: Eating pattern: 2 protein shakes daily + 1 low-sugar yogurt or yogurt drink Dining out: 0 Beverages: Premier protein shakes, 1 1/2 - 2 bottles vitamin water  Fluid intake: 53-63oz Estimated total protein intake: 60-72g protein Bariatric diet adherence:  . Using straws: no . Drinking fluids during meals: no . Carbonated beverages: no  Recent physical activity:  Gradually increasing ADLs   Nutrition Intervention:   . Reviewed progress since surgery . Discussed advancement of diet to include solid (moist, tender) protein foods with goal of eating 1-2oz at a time. Reviewed importance of small bites, thoroughly chewing, slow eating, and avoidance of fluids during meals.  . Discussed adding soft low-carb vegetables only when easily able to eat 1-2oz of protein foods at a meal.  . Advised reducing calcium supplementation to 500-600mg  3 times daily.   Nutritional Diagnosis:  [place]-3.3 Overweight/obesity As related to  history of excess calories and inadequate physical activity.  As evidenced by patient with current BMI of 48.8, following bariatric diet guidelines after surgical revision to RnY gastric bypass.  Teaching Method Utilized:  Visual Auditory Hands on  Materials provided:  Phase 2 and 3 bariatric diet handouts  Visit summary with goals/ instructions  Learning Readiness:   Change in progress  Barriers to learning/adherence to lifestyle change: none  Demonstrated degree of understanding via:  Teach Back      Plan: . Work on goals as listed in "patient instructions" . Return for MNT follow-up 02/14/21 at 1:30pm

## 2021-01-08 DIAGNOSIS — K912 Postsurgical malabsorption, not elsewhere classified: Secondary | ICD-10-CM | POA: Diagnosis not present

## 2021-01-16 ENCOUNTER — Other Ambulatory Visit: Payer: Self-pay | Admitting: *Deleted

## 2021-01-16 NOTE — Patient Outreach (Signed)
[place] [facility]) Care Management  01/16/2021  Nathaniel Curry [birth year] [id number]   Transition of care /Case Closure Unsuccessful outreach    Referral received:12/17/20 Initial outreach:12/19/20 Insurance: [place]    Unable to complete post hospital discharge transition of care assessment. No return call form patient after 3 call attempts and no response to request to contact RN Care Coordinator in unsuccessful outreach letter mailed to home on 12/19/20.  Objective: Per electronic record ,[facility] .Comorbidities include: Hypertension, GERD, s/p laparoscopic sleeve gastrectomy 2014. He was discharged to home on3/1/22without the need for home health servicesor DME. Plan Case closed to [place] care management services unsuccessful call attempts x 4.    Joylene Draft, RN, BSN  [place] Management Coordinator  [phone]- Mobile [phone]- Toll Free Main Office

## 2021-02-06 ENCOUNTER — Other Ambulatory Visit: Payer: Self-pay

## 2021-02-06 ENCOUNTER — Other Ambulatory Visit: Payer: Self-pay | Admitting: Internal Medicine

## 2021-02-06 MED FILL — Amlodipine Besylate-Olmesartan Medoxomil Tab 10-20 MG: ORAL | 90 days supply | Qty: 90 | Fill #0 | Status: AC

## 2021-02-06 MED FILL — Alprazolam Tab 0.5 MG: ORAL | 30 days supply | Qty: 30 | Fill #0 | Status: AC

## 2021-02-10 ENCOUNTER — Other Ambulatory Visit: Payer: Self-pay

## 2021-02-10 MED FILL — Indapamide Tab 2.5 MG: ORAL | 90 days supply | Qty: 90 | Fill #0 | Status: AC

## 2021-02-10 MED FILL — Pantoprazole Sodium EC Tab 40 MG (Base Equiv): ORAL | 90 days supply | Qty: 180 | Fill #0 | Status: AC

## 2021-02-11 ENCOUNTER — Other Ambulatory Visit: Payer: Self-pay

## 2021-02-11 ENCOUNTER — Other Ambulatory Visit: Payer: Self-pay | Admitting: Internal Medicine

## 2021-02-12 ENCOUNTER — Other Ambulatory Visit: Payer: Self-pay

## 2021-02-12 MED ORDER — SERTRALINE HCL 50 MG PO TABS
1.0000 | ORAL_TABLET | Freq: Every day | ORAL | 3 refills | Status: DC
  Filled 2021-02-12 – 2021-03-06 (×2): qty 90, 90d supply, fill #0
  Filled 2021-06-06: qty 90, 90d supply, fill #1

## 2021-02-14 ENCOUNTER — Ambulatory Visit: Payer: 59 | Admitting: Dietician

## 2021-02-26 ENCOUNTER — Other Ambulatory Visit: Payer: Self-pay

## 2021-03-06 ENCOUNTER — Encounter: Payer: 59 | Attending: General Surgery | Admitting: Dietician

## 2021-03-06 ENCOUNTER — Other Ambulatory Visit: Payer: Self-pay

## 2021-03-06 ENCOUNTER — Encounter: Payer: Self-pay | Admitting: Dietician

## 2021-03-06 VITALS — Wt 321.8 lb

## 2021-03-06 DIAGNOSIS — Z6841 Body Mass Index (BMI) 40.0 and over, adult: Secondary | ICD-10-CM

## 2021-03-06 MED FILL — Metoprolol Tartrate Tab 25 MG: ORAL | 90 days supply | Qty: 180 | Fill #0 | Status: AC

## 2021-03-06 MED FILL — Alprazolam Tab 0.5 MG: ORAL | 30 days supply | Qty: 30 | Fill #1 | Status: AC

## 2021-03-06 NOTE — Patient Instructions (Signed)
   Make soup with mostly protein and vegetables. Some beans are ok if tolerated.   Begin regular exercise as cleared by MD.   Continue to strictly limit starches ie breads, rice, pasta, as these foods are harder to digest and can lead to increase in circulating insulin which could slow weight loss.   Begin taking miralax for better intestinal transit time.

## 2021-03-06 NOTE — Progress Notes (Signed)
Nutrition Therapy for Post-Operative Bariatric Diet Follow-up visit:  3 months  post-op RnY Gastric Bypass Surgery  Medical Nutrition Therapy:  Appt start time: 1510 end time:  4818.  Anthropometrics: Weight: 321.8lbs Height: 5'9"  BMI: 47.52 01/02/21  331.1            48.89  Clinical: Medications: ALPRAZolam, indapamide, metoprolol, sertraline Supplementation: bariatric multivitamin 1x daily; calcium citrate 600mg  3x daily Health/ medical history changes: no changes GI symptoms: N/V/D/C: none Dumping Syndrome: no Hair loss: no  Dietary/ Lifestyle Progress: . Patient reports feeling bloated, likely slow intestinal transit. Has not yet tried miralax.  . Biggest symptom currently is fatigue . He reports having advanced his diet to include solid proteins, low carb vegetables, fruits, and low carb soft starches ie low carb wraps. He denies any adverse GI effects from his food intake.   Dietary recall: Eating pattern: 3 meals and 2 snacks daily Dining out: not assessed today Breakfast: protein shake Snack: boiled egg, soft protein bar  Lunch: soup Snack: watermelon/ cantaloupe/ grapes  Dinner: lunchmeat wrap or protein shake Snack: none or protein if needed to reach daily goal  Fluid intake: at least 64+ oz daily water, vitamin water Estimated total protein intake: 75-80g daily Bariatric diet adherence:  . Using straws: yes (drinks more when using straws) . Drinking fluids during meals: yes . Carbonated beverages: no  Recent physical activity:  None yet, recently cleared to begin and plans to start   Nutrition Intervention:   . Reviewed progress since previous visit.  . Reviewed diet stages and timeline.  . Discussed role of exercise in ongoing weight loss.  Marland Kitchen Updated nutrition goals based on patient's input.  Nutritional Diagnosis:  [place]-3.3 Overweight/obesity As related to history of excess calories and inadequate physical activity.  As evidenced by patient with current BMI  of 47.52, restricting calorie intake and food choices for ongoing weight loss after bariatric revision.  Teaching Method Utilized:  Visual Auditory Hands on  Materials provided:  Phase 4 bariatric diet handout  Learning Readiness:   Change in progress  Barriers to learning/adherence to lifestyle change: none  Demonstrated degree of understanding via:  Teach Back      Plan: . Restrict starchy foods and emphasize lean proteins and low carb vegetables.  . Begin regular exercise.  Marland Kitchen Return 06/06/21 for 6 month post-op MNT.

## 2021-05-08 MED FILL — Alprazolam Tab 0.5 MG: ORAL | 30 days supply | Qty: 30 | Fill #2 | Status: AC

## 2021-05-09 ENCOUNTER — Other Ambulatory Visit: Payer: Self-pay

## 2021-05-17 ENCOUNTER — Encounter: Payer: Self-pay | Admitting: Oncology

## 2021-05-20 ENCOUNTER — Encounter: Payer: Self-pay | Admitting: Oncology

## 2021-06-02 MED FILL — Indapamide Tab 2.5 MG: ORAL | 90 days supply | Qty: 90 | Fill #1 | Status: AC

## 2021-06-02 MED FILL — Metoprolol Tartrate Tab 25 MG: ORAL | 90 days supply | Qty: 180 | Fill #1 | Status: AC

## 2021-06-02 MED FILL — Alprazolam Tab 0.5 MG: ORAL | 30 days supply | Qty: 30 | Fill #3 | Status: CN

## 2021-06-03 ENCOUNTER — Other Ambulatory Visit: Payer: Self-pay

## 2021-06-05 ENCOUNTER — Other Ambulatory Visit: Payer: Self-pay

## 2021-06-05 MED FILL — Alprazolam Tab 0.5 MG: ORAL | 30 days supply | Qty: 30 | Fill #3 | Status: CN

## 2021-06-06 ENCOUNTER — Encounter: Payer: Self-pay | Admitting: Dietician

## 2021-06-06 ENCOUNTER — Other Ambulatory Visit: Payer: Self-pay

## 2021-06-06 ENCOUNTER — Encounter: Payer: 59 | Attending: General Surgery | Admitting: Dietician

## 2021-06-06 MED FILL — Alprazolam Tab 0.5 MG: ORAL | 30 days supply | Qty: 30 | Fill #3 | Status: AC

## 2021-06-06 MED FILL — Sucralfate Tab 1 GM: ORAL | 30 days supply | Qty: 120 | Fill #0 | Status: AC

## 2021-06-06 MED FILL — Pantoprazole Sodium EC Tab 40 MG (Base Equiv): ORAL | 90 days supply | Qty: 180 | Fill #1 | Status: AC

## 2021-06-06 NOTE — Progress Notes (Signed)
Nutrition Therapy for Post-Operative Bariatric Diet Follow-up visit:  6 months  post-op RnY Gastric Bypass Surgery conversion from sleeve  Medical Nutrition Therapy:  Appt start time: 1630 end time:  1700.  Anthropometrics: Weight: 312.4lbs Height: 5'9" BMI: 46.13 Previous weight: 321.8lbs 03/06/21  Clinical: Medications: ALPRAZolam, indapamide, metoprolol, pantoprazole prn, sertraline, sucralfate Supplementation: bariatric multivitamin 1x daily, calcium citrate 3x daily Health/ medical history changes: no changes GI symptoms: N/V/D/C: some reflux, uses carafate and protonix which help Dumping Syndrome: no Hair loss: no  Dietary/ Lifestyle Progress: Patient reports chronic fatigue ongoing which has made phsycial activity more challenging He feels some symptoms of iron deficiency, including ice craving; had anemia with prior surgery as well. Also questions whether his B12 is low. He has been taking multivitamin formulated for one daily dose, containing '45mg'$  iron.  He voices some concern over slow weight loss but understands the conversion surgery often results in slower weight loss.   Dietary recall: Eating pattern: 3 meals and 2 snacks daily Dining out:  Breakfast: protein shake Snack: boiled egg, protein bar  Lunch: 1c. [employer] soup with chicken 19g carb Snack: fruit  Dinner: wrap with lunchmeat, or protein shake;  1- 1.5c taco soup with beans and extra lean ground meat Snack: none or protien shake if needed Beverages: vitamin water 1-2 bottles daily; protein shakes, soups  Fluid intake: 40-50oz or more daily Estimated total protein intake: 50-60g aily Bariatric diet adherence:  Using straws: yes Drinking fluids during meals: no Carbonated beverages: none  Recent physical activity:  limited due to low energy   Nutrition Intervention:   Reviewed progress since previous visit Discussed possible factors for fatigue and potential solutions, ie increasing solid foods including  some high fiber foods and protein sources, adequate fluid intake Improving fatigue to then increase physical activity for ongoing weight loss and improved body composition. He has tracked dietary intake occasionally and estimates consumption of 1500-1600kcal daily. Patient will further discuss with MD at next visit. He will schedule next MNT follow up after getting clarity with iron status and B12 status.   Nutritional Diagnosis:  [place]-3.3 Overweight/obesity As related to history of excess calories and inadequate physical activity.  As evidenced by patient with current BMI of 46.13, following bariatric diet for ongoing weight loss after revision from sleeve to gastric bypass.  Teaching Method Utilized:  Visual Auditory Hands on  Materials provided: AND bariatric menu plans   Learning Readiness:  Change in progress  Barriers to learning/adherence to lifestyle change: none  Demonstrated degree of understanding via:  Teach Back      Plan: Return for MNT follow up at 12 months post-op or sooner if needed.

## 2021-06-06 NOTE — Patient Instructions (Signed)
Gradually increase solid foods and decrease protein shakes Adding variety of solid proteins and small amounts of whole grains, fruits and vegetables will likely help with energy and thereby further promote weight loss. Do eat the protein first, follow with low carb veg, and then other foods.

## 2021-06-20 ENCOUNTER — Encounter: Payer: Self-pay | Admitting: Oncology

## 2021-07-04 DIAGNOSIS — Z13220 Encounter for screening for lipoid disorders: Secondary | ICD-10-CM | POA: Diagnosis not present

## 2021-07-04 DIAGNOSIS — Z131 Encounter for screening for diabetes mellitus: Secondary | ICD-10-CM | POA: Diagnosis not present

## 2021-07-04 DIAGNOSIS — Z0000 Encounter for general adult medical examination without abnormal findings: Secondary | ICD-10-CM | POA: Diagnosis not present

## 2021-07-29 ENCOUNTER — Other Ambulatory Visit: Payer: Self-pay

## 2021-07-29 DIAGNOSIS — Z0000 Encounter for general adult medical examination without abnormal findings: Secondary | ICD-10-CM | POA: Diagnosis not present

## 2021-07-29 DIAGNOSIS — E559 Vitamin D deficiency, unspecified: Secondary | ICD-10-CM | POA: Diagnosis not present

## 2021-07-29 MED ORDER — AMLODIPINE-OLMESARTAN 10-20 MG PO TABS
1.0000 | ORAL_TABLET | Freq: Every day | ORAL | 3 refills | Status: DC
  Filled 2021-07-29: qty 90, 90d supply, fill #0
  Filled 2021-10-31: qty 90, 90d supply, fill #1
  Filled 2022-03-09: qty 90, 90d supply, fill #2
  Filled 2022-05-31: qty 90, 90d supply, fill #3

## 2021-07-29 MED ORDER — ALPRAZOLAM 0.5 MG PO TABS
ORAL_TABLET | ORAL | 5 refills | Status: DC
  Filled 2021-07-29: qty 30, 30d supply, fill #0
  Filled 2021-10-01: qty 30, 30d supply, fill #1
  Filled 2021-10-31: qty 30, 30d supply, fill #2
  Filled 2021-11-27 – 2021-11-29 (×2): qty 30, 30d supply, fill #3
  Filled 2021-12-26: qty 30, 30d supply, fill #4
  Filled 2022-01-22: qty 30, 30d supply, fill #5

## 2021-07-29 MED ORDER — COLCHICINE 0.6 MG PO TABS
ORAL_TABLET | ORAL | 1 refills | Status: AC
  Filled 2021-07-29: qty 60, 30d supply, fill #0
  Filled 2022-05-31: qty 60, 30d supply, fill #1

## 2021-07-29 MED ORDER — INDAPAMIDE 2.5 MG PO TABS
ORAL_TABLET | ORAL | 3 refills | Status: DC
  Filled 2021-07-29 – 2021-10-01 (×2): qty 90, 90d supply, fill #0
  Filled 2021-12-26: qty 90, 90d supply, fill #1
  Filled 2022-03-09: qty 90, 90d supply, fill #2
  Filled 2022-05-31: qty 90, 90d supply, fill #3

## 2021-07-29 MED ORDER — METOCLOPRAMIDE HCL 5 MG PO TABS
ORAL_TABLET | ORAL | 0 refills | Status: DC
  Filled 2021-07-29: qty 90, 90d supply, fill #0

## 2021-07-29 MED ORDER — VENLAFAXINE HCL ER 37.5 MG PO CP24
ORAL_CAPSULE | ORAL | 11 refills | Status: DC
  Filled 2021-07-29: qty 30, 30d supply, fill #0

## 2021-07-30 ENCOUNTER — Other Ambulatory Visit: Payer: Self-pay

## 2021-08-16 ENCOUNTER — Other Ambulatory Visit: Payer: Self-pay

## 2021-08-16 MED ORDER — VENLAFAXINE HCL ER 75 MG PO CP24
ORAL_CAPSULE | ORAL | 3 refills | Status: DC
  Filled 2021-08-16: qty 90, 90d supply, fill #0
  Filled 2021-10-31: qty 90, 90d supply, fill #1
  Filled 2022-03-09: qty 90, 90d supply, fill #2
  Filled 2022-05-31: qty 90, 90d supply, fill #3

## 2021-08-16 MED ORDER — METOCLOPRAMIDE HCL 5 MG PO TABS
ORAL_TABLET | ORAL | 3 refills | Status: DC
  Filled 2021-08-16 – 2021-08-20 (×2): qty 90, 90d supply, fill #0
  Filled 2022-06-02: qty 90, 90d supply, fill #1

## 2021-08-19 ENCOUNTER — Other Ambulatory Visit: Payer: Self-pay

## 2021-08-20 ENCOUNTER — Other Ambulatory Visit: Payer: Self-pay

## 2021-09-30 ENCOUNTER — Other Ambulatory Visit: Payer: Self-pay

## 2021-09-30 DIAGNOSIS — K5900 Constipation, unspecified: Secondary | ICD-10-CM | POA: Diagnosis not present

## 2021-09-30 DIAGNOSIS — R1033 Periumbilical pain: Secondary | ICD-10-CM | POA: Diagnosis not present

## 2021-09-30 MED ORDER — AMOXICILLIN-POT CLAVULANATE 875-125 MG PO TABS
ORAL_TABLET | ORAL | 0 refills | Status: DC
  Filled 2021-09-30: qty 14, 7d supply, fill #0

## 2021-09-30 MED ORDER — LINZESS 72 MCG PO CAPS
ORAL_CAPSULE | ORAL | 1 refills | Status: DC
  Filled 2021-09-30: qty 30, 30d supply, fill #0
  Filled 2021-10-31: qty 30, 30d supply, fill #1

## 2021-10-01 ENCOUNTER — Other Ambulatory Visit: Payer: Self-pay | Admitting: Student

## 2021-10-01 ENCOUNTER — Other Ambulatory Visit: Payer: Self-pay

## 2021-10-01 ENCOUNTER — Ambulatory Visit
Admission: RE | Admit: 2021-10-01 | Discharge: 2021-10-01 | Disposition: A | Discharge status: Home / Self Care | Payer: 59 | Source: Ambulatory Visit | Attending: Student | Admitting: Student

## 2021-10-01 DIAGNOSIS — R1033 Periumbilical pain: Secondary | ICD-10-CM | POA: Diagnosis not present

## 2021-10-01 DIAGNOSIS — K769 Liver disease, unspecified: Secondary | ICD-10-CM | POA: Diagnosis not present

## 2021-10-01 LAB — POCT I-STAT CREATININE: Creatinine, Ser: 1.2 mg/dL (ref 0.61–1.24)

## 2021-10-01 MED ORDER — IOHEXOL 300 MG/ML  SOLN
100.0000 mL | Freq: Once | INTRAMUSCULAR | Status: AC | PRN
  Administered 2021-10-01: 100 mL via INTRAVENOUS

## 2021-10-01 MED ORDER — IOHEXOL 350 MG/ML SOLN: 100.0000 mL | Freq: Once | INTRAVENOUS | Status: DC | PRN

## 2021-10-02 ENCOUNTER — Other Ambulatory Visit: Payer: Self-pay

## 2021-10-02 MED ORDER — PHENTERMINE HCL 37.5 MG PO CAPS
ORAL_CAPSULE | ORAL | 0 refills | Status: DC
  Filled 2021-10-02: qty 30, 30d supply, fill #0

## 2021-10-23 ENCOUNTER — Other Ambulatory Visit: Payer: Self-pay | Admitting: Internal Medicine

## 2021-10-23 ENCOUNTER — Ambulatory Visit
Admission: RE | Admit: 2021-10-23 | Discharge: 2021-10-23 | Disposition: A | Discharge status: Home / Self Care | Payer: 59 | Source: Ambulatory Visit | Attending: Internal Medicine | Admitting: Internal Medicine

## 2021-10-23 ENCOUNTER — Other Ambulatory Visit (HOSPITAL_COMMUNITY): Payer: Self-pay | Admitting: Internal Medicine

## 2021-10-23 DIAGNOSIS — R509 Fever, unspecified: Secondary | ICD-10-CM | POA: Diagnosis not present

## 2021-10-23 DIAGNOSIS — K449 Diaphragmatic hernia without obstruction or gangrene: Secondary | ICD-10-CM | POA: Diagnosis not present

## 2021-10-23 DIAGNOSIS — R1084 Generalized abdominal pain: Secondary | ICD-10-CM | POA: Diagnosis not present

## 2021-10-23 DIAGNOSIS — N200 Calculus of kidney: Secondary | ICD-10-CM | POA: Diagnosis not present

## 2021-10-23 DIAGNOSIS — K5792 Diverticulitis of intestine, part unspecified, without perforation or abscess without bleeding: Secondary | ICD-10-CM

## 2021-10-23 DIAGNOSIS — K5720 Diverticulitis of large intestine with perforation and abscess without bleeding: Secondary | ICD-10-CM | POA: Diagnosis not present

## 2021-10-23 MED ORDER — IOHEXOL 300 MG/ML  SOLN
100.0000 mL | Freq: Once | INTRAMUSCULAR | Status: AC | PRN
  Administered 2021-10-23: 100 mL via INTRAVENOUS

## 2021-10-24 ENCOUNTER — Other Ambulatory Visit: Payer: Self-pay

## 2021-10-24 ENCOUNTER — Encounter (HOSPITAL_COMMUNITY): Payer: Self-pay

## 2021-10-24 ENCOUNTER — Emergency Department (HOSPITAL_COMMUNITY): Payer: 59

## 2021-10-24 ENCOUNTER — Inpatient Hospital Stay (HOSPITAL_COMMUNITY)
Admission: EM | Admit: 2021-10-24 | Discharge: 2021-10-30 | DRG: 391 | Disposition: A | Discharge status: Home / Self Care | Payer: 59 | Attending: General Surgery | Admitting: General Surgery

## 2021-10-24 ENCOUNTER — Telehealth: Payer: Self-pay | Admitting: General Surgery

## 2021-10-24 DIAGNOSIS — K5720 Diverticulitis of large intestine with perforation and abscess without bleeding: Principal | ICD-10-CM | POA: Diagnosis present

## 2021-10-24 DIAGNOSIS — Z833 Family history of diabetes mellitus: Secondary | ICD-10-CM

## 2021-10-24 DIAGNOSIS — Z9884 Bariatric surgery status: Secondary | ICD-10-CM | POA: Diagnosis not present

## 2021-10-24 DIAGNOSIS — Z8051 Family history of malignant neoplasm of kidney: Secondary | ICD-10-CM

## 2021-10-24 DIAGNOSIS — K219 Gastro-esophageal reflux disease without esophagitis: Secondary | ICD-10-CM | POA: Diagnosis present

## 2021-10-24 DIAGNOSIS — M109 Gout, unspecified: Secondary | ICD-10-CM | POA: Diagnosis present

## 2021-10-24 DIAGNOSIS — Z83438 Family history of other disorder of lipoprotein metabolism and other lipidemia: Secondary | ICD-10-CM

## 2021-10-24 DIAGNOSIS — K56609 Unspecified intestinal obstruction, unspecified as to partial versus complete obstruction: Secondary | ICD-10-CM | POA: Diagnosis not present

## 2021-10-24 DIAGNOSIS — K5900 Constipation, unspecified: Secondary | ICD-10-CM | POA: Diagnosis present

## 2021-10-24 DIAGNOSIS — F419 Anxiety disorder, unspecified: Secondary | ICD-10-CM | POA: Diagnosis present

## 2021-10-24 DIAGNOSIS — Z800 Family history of malignant neoplasm of digestive organs: Secondary | ICD-10-CM | POA: Diagnosis not present

## 2021-10-24 DIAGNOSIS — Z811 Family history of alcohol abuse and dependence: Secondary | ICD-10-CM | POA: Diagnosis not present

## 2021-10-24 DIAGNOSIS — Z808 Family history of malignant neoplasm of other organs or systems: Secondary | ICD-10-CM | POA: Diagnosis not present

## 2021-10-24 DIAGNOSIS — Z8049 Family history of malignant neoplasm of other genital organs: Secondary | ICD-10-CM

## 2021-10-24 DIAGNOSIS — Z803 Family history of malignant neoplasm of breast: Secondary | ICD-10-CM | POA: Diagnosis not present

## 2021-10-24 DIAGNOSIS — B952 Enterococcus as the cause of diseases classified elsewhere: Secondary | ICD-10-CM | POA: Diagnosis present

## 2021-10-24 DIAGNOSIS — Z20822 Contact with and (suspected) exposure to covid-19: Secondary | ICD-10-CM | POA: Diagnosis not present

## 2021-10-24 DIAGNOSIS — L0291 Cutaneous abscess, unspecified: Secondary | ICD-10-CM

## 2021-10-24 DIAGNOSIS — K6389 Other specified diseases of intestine: Secondary | ICD-10-CM | POA: Diagnosis not present

## 2021-10-24 DIAGNOSIS — K651 Peritoneal abscess: Secondary | ICD-10-CM | POA: Diagnosis not present

## 2021-10-24 DIAGNOSIS — I10 Essential (primary) hypertension: Secondary | ICD-10-CM | POA: Diagnosis present

## 2021-10-24 DIAGNOSIS — K567 Ileus, unspecified: Secondary | ICD-10-CM | POA: Diagnosis present

## 2021-10-24 DIAGNOSIS — A419 Sepsis, unspecified organism: Secondary | ICD-10-CM | POA: Diagnosis not present

## 2021-10-24 DIAGNOSIS — Z79899 Other long term (current) drug therapy: Secondary | ICD-10-CM

## 2021-10-24 DIAGNOSIS — Z8249 Family history of ischemic heart disease and other diseases of the circulatory system: Secondary | ICD-10-CM | POA: Diagnosis not present

## 2021-10-24 DIAGNOSIS — Z6841 Body Mass Index (BMI) 40.0 and over, adult: Secondary | ICD-10-CM

## 2021-10-24 DIAGNOSIS — K5780 Diverticulitis of intestine, part unspecified, with perforation and abscess without bleeding: Secondary | ICD-10-CM | POA: Diagnosis not present

## 2021-10-24 LAB — COMPREHENSIVE METABOLIC PANEL
ALT: 19 U/L (ref 0–44)
AST: 18 U/L (ref 15–41)
Albumin: 3.8 g/dL (ref 3.5–5.0)
Alkaline Phosphatase: 78 U/L (ref 38–126)
Anion gap: 8 (ref 5–15)
BUN: 9 mg/dL (ref 6–20)
CO2: 23 mmol/L (ref 22–32)
Calcium: 9 mg/dL (ref 8.9–10.3)
Chloride: 103 mmol/L (ref 98–111)
Creatinine, Ser: 1.08 mg/dL (ref 0.61–1.24)
GFR, Estimated: >60 mL/min (ref >60)
Glucose, Bld: 108 mg/dL — ABNORMAL HIGH (ref 70–99)
Potassium: 3.9 mmol/L (ref 3.5–5.1)
Sodium: 134 mmol/L — ABNORMAL LOW (ref 135–145)
Total Bilirubin: 1.4 mg/dL — ABNORMAL HIGH (ref 0.3–1.2)
Total Protein: 8.6 g/dL — ABNORMAL HIGH (ref 6.5–8.1)

## 2021-10-24 LAB — CBC WITH DIFFERENTIAL/PLATELET
Abs Immature Granulocytes: 0.1 10*3/uL — ABNORMAL HIGH (ref 0.00–0.07)
Basophils Absolute: 0.1 10*3/uL (ref 0.0–0.1)
Basophils Relative: 0 %
Eosinophils Absolute: 0.1 10*3/uL (ref 0.0–0.5)
Eosinophils Relative: 0 %
HCT: 44.9 % (ref 39.0–52.0)
Hemoglobin: 14.4 g/dL (ref 13.0–17.0)
Immature Granulocytes: 1 %
Lymphocytes Relative: 12 %
Lymphs Abs: 2.3 10*3/uL (ref 0.7–4.0)
MCH: 26.6 pg (ref 26.0–34.0)
MCHC: 32.1 g/dL (ref 30.0–36.0)
MCV: 82.8 fL (ref 80.0–100.0)
Monocytes Absolute: 1.5 10*3/uL — ABNORMAL HIGH (ref 0.1–1.0)
Monocytes Relative: 8 %
Neutro Abs: 14.8 10*3/uL — ABNORMAL HIGH (ref 1.7–7.7)
Neutrophils Relative %: 79 %
Platelets: 454 10*3/uL — ABNORMAL HIGH (ref 150–400)
RBC: 5.42 MIL/uL (ref 4.22–5.81)
RDW: 13.2 % (ref 11.5–15.5)
WBC: 18.9 10*3/uL — ABNORMAL HIGH (ref 4.0–10.5)
nRBC: 0 % (ref 0.0–0.2)

## 2021-10-24 LAB — PROTIME-INR
INR: 1.2 (ref 0.8–1.2)
Prothrombin Time: 15.3 seconds — ABNORMAL HIGH (ref 11.4–15.2)

## 2021-10-24 LAB — RESP PANEL BY RT-PCR (FLU A&B, COVID) ARPGX2
Influenza A by PCR: NEGATIVE
Influenza B by PCR: NEGATIVE
SARS Coronavirus 2 by RT PCR: NEGATIVE

## 2021-10-24 LAB — APTT: aPTT: 49 seconds — ABNORMAL HIGH (ref 24–36)

## 2021-10-24 LAB — LACTIC ACID, PLASMA: Lactic Acid, Venous: 0.9 mmol/L (ref 0.5–1.9)

## 2021-10-24 MED ORDER — METOPROLOL TARTRATE 25 MG PO TABS: 25.0000 mg | ORAL_TABLET | Freq: Two times a day (BID) | ORAL | Status: DC

## 2021-10-24 MED ORDER — ACETAMINOPHEN 650 MG RE SUPP: 650.0000 mg | Freq: Four times a day (QID) | RECTAL | Status: DC | PRN

## 2021-10-24 MED ORDER — ACETAMINOPHEN 325 MG PO TABS
650.0000 mg | ORAL_TABLET | Freq: Four times a day (QID) | ORAL | Status: DC | PRN
  Administered 2021-10-25: 650 mg via ORAL
  Filled 2021-10-24: qty 2

## 2021-10-24 MED ORDER — SERTRALINE HCL 50 MG PO TABS: 50.0000 mg | ORAL_TABLET | Freq: Every day | ORAL | Status: DC

## 2021-10-24 MED ORDER — SODIUM CHLORIDE 0.9 % IV SOLN: INTRAVENOUS | Status: DC

## 2021-10-24 MED ORDER — PIPERACILLIN-TAZOBACTAM 3.375 G IVPB
3.3750 g | Freq: Three times a day (TID) | INTRAVENOUS | Status: DC
  Administered 2021-10-24 – 2021-10-29 (×14): 3.375 g via INTRAVENOUS
  Filled 2021-10-24 (×15): qty 50

## 2021-10-24 MED ORDER — LACTATED RINGERS IV SOLN: INTRAVENOUS | Status: AC

## 2021-10-24 MED ORDER — AMLODIPINE-OLMESARTAN 10-20 MG PO TABS: 1.0000 | ORAL_TABLET | Freq: Every day | ORAL | Status: DC

## 2021-10-24 MED ORDER — ONDANSETRON HCL 4 MG/2ML IJ SOLN
4.0000 mg | Freq: Four times a day (QID) | INTRAMUSCULAR | Status: DC | PRN
  Administered 2021-10-25 – 2021-10-30 (×10): 4 mg via INTRAVENOUS
  Filled 2021-10-24 (×10): qty 2

## 2021-10-24 MED ORDER — HYDROMORPHONE HCL 1 MG/ML IJ SOLN
0.5000 mg | INTRAMUSCULAR | Status: DC | PRN
  Administered 2021-10-25 – 2021-10-26 (×7): 0.5 mg via INTRAVENOUS
  Filled 2021-10-24: qty 0.5
  Filled 2021-10-24: qty 1
  Filled 2021-10-24 (×2): qty 0.5
  Filled 2021-10-24 (×3): qty 1

## 2021-10-24 MED ORDER — ENOXAPARIN SODIUM 40 MG/0.4ML IJ SOSY
40.0000 mg | PREFILLED_SYRINGE | INTRAMUSCULAR | Status: DC
  Administered 2021-10-25 – 2021-10-30 (×5): 40 mg via SUBCUTANEOUS
  Filled 2021-10-24 (×5): qty 0.4

## 2021-10-24 MED ORDER — VENLAFAXINE HCL ER 37.5 MG PO CP24: 37.5000 mg | ORAL_CAPSULE | Freq: Every day | ORAL | Status: DC

## 2021-10-24 MED ORDER — SIMETHICONE 80 MG PO CHEW
40.0000 mg | CHEWABLE_TABLET | Freq: Four times a day (QID) | ORAL | Status: DC | PRN
  Administered 2021-10-28: 40 mg via ORAL
  Filled 2021-10-24 (×2): qty 1

## 2021-10-24 MED ORDER — ONDANSETRON 4 MG PO TBDP: 4.0000 mg | ORAL_TABLET | Freq: Four times a day (QID) | ORAL | Status: DC | PRN

## 2021-10-24 NOTE — ED Provider Triage Note (Signed)
Emergency Medicine Provider Triage Evaluation Note  Nathaniel Curry , a 40 y.o. male  was evaluated in triage.  Pt complains of left lower quadrant abdominal pain that has been ongoing and worsening since 23 December.  Patient initially had pain and bleeding for the umbilicus had a CT scan at that time which showed an abscess in that area in addition to diverticulitis.  Patient was placed on Augmentin.  After that pain is worsened.  Repeat CT scan yesterday showed diverticular abscess and possible bowel obstruction per patient.  Sent to [facility] for IV antibiotics  Review of Systems  Positive:  Negative: See above   Physical Exam  BP (!) 174/104 (BP Location: Left Arm)    Pulse 94    Temp (!) 100.9 F (38.3 C) (Oral)    Resp 16    Ht 5\' 8"  (1.727 m)    Wt (!) 140.6 kg    SpO2 98%    BMI 47.14 kg/m  Gen:   Awake, no distress   Resp:  Normal effort  MSK:   Moves extremities without difficulty  Other:  Moderate left lower quadrant tenderness  Medical Decision Making  Medically screening exam initiated at 8:58 PM.  Appropriate orders placed.  Nathaniel Curry was informed that the remainder of the evaluation will be completed by another provider, this initial triage assessment does not replace that evaluation, and the importance of remaining in the ED until their evaluation is complete.  Sepsis work-up initiated   Hendricks Limes, [place] 10/24/21 2059

## 2021-10-24 NOTE — Telephone Encounter (Signed)
Copy of [employer] message I sent to patient just now Nathaniel Curry,  As we discussed on the phone twice you have complicated diverticulitis with an abscess.  This requires admission to the hospital with IV antibiotics and interventional radiology consultation to evaluate you for percutaneous drain.  Unfortunately direct admission is not an option due to the number of patients holding in multiple ED's throughout the system.  Therefore you need to go to the emergency room.  It looks like Nathaniel Curry is going to be "the best "bet of more timely evaluation.  I have spoken to the charge nurse at [facility] long ED.

## 2021-10-24 NOTE — Progress Notes (Signed)
PHARMACY NOTE -  [place] has been assisting with dosing of Zosyn for IAI. Dosage remains stable at 3.375 g IV q8 hr and further renal adjustments per institutional Pharmacy antibiotic protocol  Pharmacy will sign off, following peripherally for culture results or dose adjustments. Please reconsult if a change in clinical status warrants re-evaluation of dosage.  Reuel Boom, PharmD, BCPS [phone] 10/24/2021, 9:11 PM

## 2021-10-24 NOTE — ED Provider Notes (Signed)
[facility] DEPT Provider Note   CSN: [id number] Arrival date & time: 10/24/21  1927     History  Chief Complaint  Patient presents with   Bowel Obstruction    Nathaniel Curry is a 41 y.o. male.  The history is provided by the patient and medical records.   41 y.o. M with hx of gout, HTN, GERD, anxiety, obesity, presenting to the ED after abnormal CT scan.  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  Started having some abdominal pain recently, had OP CT scan today that showed complicated diverticulitis with abscess.  He states he was sent in for IV abx and evaluation for possible IR drain.  Did have fever on arrival.    Home Medications Prior to Admission medications   Medication Sig Start Date End Date Taking? Authorizing Provider  ALPRAZolam Duanne Moron) 0.5 MG tablet Take 1 tablet (0.5 mg total) by mouth once daily as needed 07/29/21     amlodipine-olmesartan (AZOR) 10-20 MG tablet Take 1 tablet by mouth once daily 07/29/21     amoxicillin-clavulanate (AUGMENTIN) 875-125 MG tablet TAKE 1 TABLET BY MOUTH EVERY 12 (TWELVE) HOURS FOR 10 DAYS 10/26/20 10/26/21  Fields, Colan Neptune, NP  amoxicillin-clavulanate (AUGMENTIN) 875-125 MG tablet Take 1 tablet (875 mg total) by mouth every 12 (twelve) hours for 7 days 09/30/21     CALCIUM CITRATE PO Take 600 mg by mouth 3 (three) times daily.    [provider]  colchicine 0.6 MG tablet Take 1 tablet (0.6 mg total) by mouth 2 (two) times daily as needed (gout) 07/29/21     enoxaparin (LOVENOX) 60 MG/0.6ML injection INJECT 0.6 MLS (60 MG TOTAL) INTO THE SKIN EVERY 12 (TWELVE) HOURS FOR 14 DAYS. 12/18/20 12/18/21  Greer Pickerel, MD  gabapentin (NEURONTIN) 100 MG capsule TAKE 2 CAPSULES (200 MG TOTAL) BY MOUTH EVERY 12 (TWELVE) HOURS. Patient not taking: Reported on 01/02/2021 12/18/20 12/18/21  Greer Pickerel, MD  hydrOXYzine (VISTARIL) 25 MG capsule Take 1 capsule (25 mg total) by mouth at bedtime as needed for  anxiety. Patient not taking: Reported on 01/02/2021 06/13/19   Leone Haven, MD  indapamide (LOZOL) 2.5 MG tablet Take 1 tablet (2.5 mg total) by mouth once daily 07/29/21     linaclotide (LINZESS) 72 MCG capsule Take 1 capsule (72 mcg total) by mouth once daily 09/30/21     metoCLOPramide (REGLAN) 5 MG tablet Take 1 tablet (5 mg total) by mouth at bedtime for 90 days 07/29/21     metoCLOPramide (REGLAN) 5 MG tablet Take 1 tablet (5 mg total) by mouth at bedtime for 90 days 08/16/21     metoprolol tartrate (LOPRESSOR) 25 MG tablet TAKE 1 TABLET BY MOUTH 2 (TWO) TIMES DAILY. 12/18/20 12/18/21  Greer Pickerel, MD  Multiple Vitamins-Minerals (MULTIVITAMIN ADULT, MINERALS, PO) Take by mouth. Bariatric with iron    [provider]  ondansetron (ZOFRAN-ODT) 4 MG disintegrating tablet TAKE 1 TABLET (4 MG TOTAL) BY MOUTH EVERY 6 (SIX) HOURS AS NEEDED FOR NAUSEA OR VOMITING. 12/18/20 12/18/21  Greer Pickerel, MD  pantoprazole (PROTONIX) 40 MG tablet Take 40 mg by mouth 2 (two) times daily.    [provider]  pantoprazole (PROTONIX) 40 MG tablet TAKE 1 TABLET BY MOUTH 2 (TWO) TIMES DAILY 07/27/20 09/04/21  Rusty Aus, MD  phentermine 37.5 MG capsule Take 1 capsule (37.5 mg total) by mouth every morning before breakfast for 30 days 10/02/21     sertraline (ZOLOFT) 50 MG  tablet Take 50 mg by mouth daily. 07/27/20   [provider]  sertraline (ZOLOFT) 50 MG tablet TAKE 1 TABLET (50 MG TOTAL) BY MOUTH ONCE DAILY 02/12/21     sucralfate (CARAFATE) 1 g tablet TAKE 1 TABLET BY MOUTH 4 (FOUR) TIMES DAILY BEFORE MEALS AND NIGHTLY 07/27/20 07/27/21  Rusty Aus, MD  venlafaxine XR (EFFEXOR-XR) 37.5 MG 24 hr capsule Take 1 capsule (37.5 mg total) by mouth once daily 07/29/21     venlafaxine XR (EFFEXOR-XR) 75 MG 24 hr capsule Take 1 capsule (75 mg total) by mouth once daily 08/16/21         Allergies    Patient has no known allergies.    Review of Systems   Review of Systems   Constitutional:  Positive for fever.  Gastrointestinal:  Positive for abdominal pain.  All other systems reviewed and are negative.  Physical Exam Updated Vital Signs BP (!) 174/104 (BP Location: Left Arm)    Pulse 94    Temp (!) 100.9 F (38.3 C) (Oral)    Resp 16    Ht 5\' 8"  (1.727 m)    Wt (!) 140.6 kg    SpO2 98%    BMI 47.14 kg/m   Physical Exam Vitals and nursing note reviewed.  Constitutional:      Appearance: He is well-developed.  HENT:     Head: Normocephalic and atraumatic.  Eyes:     Conjunctiva/sclera: Conjunctivae normal.     Pupils: Pupils are equal, round, and reactive to light.  Cardiovascular:     Rate and Rhythm: Normal rate and regular rhythm.     Heart sounds: Normal heart sounds.  Pulmonary:     Effort: Pulmonary effort is normal. No respiratory distress.     Breath sounds: Normal breath sounds. No rhonchi.  Abdominal:     General: Bowel sounds are normal.     Palpations: Abdomen is soft.     Tenderness: There is abdominal tenderness.  Musculoskeletal:        General: Normal range of motion.     Cervical back: Normal range of motion.  Skin:    General: Skin is warm and dry.  Neurological:     Mental Status: He is alert and oriented to person, place, and time.    ED Results / Procedures / Treatments   Labs (all labs ordered are listed, but only abnormal results are displayed) Labs Reviewed  COMPREHENSIVE METABOLIC PANEL - Abnormal; Notable for the following components:      Result Value   Sodium 134 (*)    Glucose, Bld 108 (*)    Total Protein 8.6 (*)    Total Bilirubin 1.4 (*)    All other components within normal limits  CBC WITH DIFFERENTIAL/PLATELET - Abnormal; Notable for the following components:   WBC 18.9 (*)    Platelets 454 (*)    Neutro Abs 14.8 (*)    Monocytes Absolute 1.5 (*)    Abs Immature Granulocytes 0.10 (*)    All other components within normal limits  PROTIME-INR - Abnormal; Notable for the following components:    Prothrombin Time 15.3 (*)    All other components within normal limits  APTT - Abnormal; Notable for the following components:   aPTT 49 (*)    All other components within normal limits  RESP PANEL BY RT-PCR (FLU A&B, COVID) ARPGX2  CULTURE, BLOOD (ROUTINE X 2)  CULTURE, BLOOD (ROUTINE X 2)  LACTIC ACID, PLASMA  URINALYSIS, ROUTINE W  REFLEX MICROSCOPIC    EKG None  Radiology CT ABDOMEN PELVIS W CONTRAST  Result Date: 10/23/2021 CLINICAL DATA:  Fever. Lack of bowel movements. History of umbilical hernia repair. EXAM: CT ABDOMEN AND PELVIS WITH CONTRAST TECHNIQUE: Multidetector CT imaging of the abdomen and pelvis was performed using the standard protocol following bolus administration of intravenous contrast. CONTRAST:  139mL OMNIPAQUE IOHEXOL 300 MG/ML  SOLN COMPARISON:  10/01/2021 FINDINGS: Lower chest: Clear lung bases. Normal heart size without pericardial or pleural effusion. 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 Concurrent tiny hiatal hernia with contrast in the distal esophagus. Hepatobiliary: Poor contrast bolus, limiting evaluation of previously described indeterminate liver lesions. Example at 2.0 cm in the right hepatic lobe on 18/2. Cholecystectomy, without biliary ductal dilatation. Pancreas: Normal, without mass or ductal dilatation. Spleen: Normal in size, without focal abnormality. Adrenals/Urinary Tract: Normal adrenal glands. Bilateral punctate renal collecting system calculi. Lower pole right renal exophytic lesion measures slightly greater than fluid density and is new compared to 2015. No air within the urinary bladder. Pericystic edema is increased including on 78/2. 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 Wall thickening of and edema surrounding the sigmoid is increased. Development of a complex fluid collection positioned between the sigmoid and left side of the urinary bladder dome. Example 4.8  x 4.2 cm on 74/2. Minimal gas within. Favor fistulous communication between the proximal and distal sigmoid including on 71/2. The colon is stool-filled proximal to this site, suggesting upstream constipation and possible low-grade obstruction. Normal terminal ileum and appendix. Normal small bowel. Vascular/Lymphatic: Normal caliber of the aorta and branch vessels. No abdominopelvic adenopathy. Reproductive: Normal prostate. Other: No significant free fluid. No free intraperitoneal air. Tiny fat containing ventral abdominal wall hernia. Musculoskeletal: No acute osseous abnormality. IMPRESSION: 1. Progressive, now complicated sigmoid diverticulitis, as evidenced by developing abscess caudal to the sigmoid and cephalad to the urinary bladder. Surrounding pelvic and pericystic edema, without well-defined colonic to bladder fistula. This cannot however, be entirely excluded. 2. 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 3. Upstream stool volume suggests constipation or low-grade obstruction at the level of the sigmoid. 4. Bilateral nephrolithiasis. 5. 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 Esophageal air fluid level suggests dysmotility or gastroesophageal reflux. These results will be called to the ordering clinician or representative by the Radiology Department at the imaging location. Electronically Signed   By: Abigail Miyamoto M.D.   On: 10/23/2021 20:44   DG Chest Port 1 View  Result Date: 10/24/2021 CLINICAL DATA:  Sepsis EXAM: PORTABLE CHEST 1 VIEW COMPARISON:  09/04/2020 FINDINGS: The heart size and mediastinal contours are within normal limits. Both lungs are clear. The visualized skeletal structures are unremarkable. IMPRESSION: No active disease. Electronically Signed   By: Fidela Salisbury M.D.   On: 10/24/2021 21:21    Procedures Procedures    Medications Ordered in ED Medications  lactated ringers infusion ( Intravenous New Bag/Given 10/24/21 2148)  piperacillin-tazobactam  (ZOSYN) IVPB 3.375 g (3.375 g Intravenous New Bag/Given 10/24/21 2147)  enoxaparin (LOVENOX) injection 40 mg (has no administration in time range)  0.9 %  sodium chloride infusion (has no administration in time range)  acetaminophen (TYLENOL) tablet 650 mg (has no administration in time range)    Or  acetaminophen (TYLENOL) suppository 650 mg (has no administration in time range)  HYDROmorphone (DILAUDID) injection 0.5 mg (has no administration in time range)  ondansetron (ZOFRAN-ODT) disintegrating tablet 4 mg (has no administration in time range)  Or  ondansetron (ZOFRAN) injection 4 mg (has no administration in time range)  simethicone (MYLICON) chewable tablet 40 mg (has no administration in time range)  metoprolol tartrate (LOPRESSOR) tablet 25 mg (has no administration in time range)  sertraline (ZOLOFT) tablet 50 mg (has no administration in time range)  venlafaxine XR (EFFEXOR-XR) 24 hr capsule 37.5 mg (has no administration in time range)  amlodipine-olmesartan (AZOR) 10-20 MG per tablet 1 tablet (has no administration in time range)    ED Course/ Medical Decision Making/ A&P                           Medical Decision Making Amount and/or Complexity of Data Reviewed External Data Reviewed: labs and radiology. Labs: ordered. Radiology: ordered and independent interpretation performed.   41 y.o. M here with abnormal OP CT scan earlier today that showed complicated diverticulitis with abscess.  His surgeon, Dr. [name], sent him in for IV abx and evaluation for possible IR drain placement.  He is febrile here but non-toxic.  Some tenderness to the lower abdomen as expected.  Labs ordered and reviewed, notable for normal lactate but leukocytosis to 18.9K.  chemistry is overall reassuring.  CXR is clear.  CT reviewed-- does have complicated diverticulitis and questionable colonic-bladder fistula.  He has been given IV zosyn.  Covid screen is negative.  General surgery, Dr. Donne Hazel--  aware of patient and has evaluated in the ED, will admit for ongoing care.     CRITICAL CARE Performed by: Larene Pickett   Total critical care time: 35 minutes  Critical care time was exclusive of separately billable procedures and treating other patients.  Critical care was necessary to treat or prevent imminent or life-threatening deterioration.  Critical care was time spent personally by me on the following activities: development of treatment plan with patient and/or surrogate as well as nursing, discussions with consultants, evaluation of patient's response to treatment, examination of patient, obtaining history from patient or surrogate, ordering and performing treatments and interventions, ordering and review of laboratory studies, ordering and review of radiographic studies, pulse oximetry and re-evaluation of patient's condition.  Final diagnoses:  Diverticulitis of large intestine with abscess without bleeding    Rx / DC Orders ED Discharge Orders     None         Larene Pickett, PA-C 10/24/21 2337    Molpus, Jenny Reichmann, MD 10/25/21 [phone]

## 2021-10-24 NOTE — H&P (Signed)
Nathaniel Curry is an 41 y.o. male.   Chief Complaint: ab pain HPI: 41 y.o. male with history of gastric sleeve at [facility] in 2014, who was dealing with reflux symptoms  despite multiple oral medications along with hiatal hernia.  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 [name].   Was seen in our office in mid December. "He states 4 days ago he started to develop periumbilical pain. The area superior to his umbilicus became indurated and slightly red. Two days ago he experienced serosanguineous fluid from his umbilicus. Yesterday, he was pressing on the area and felt a pop and then had purulent drainage. He reports T-max 100.2 F. He has been taking Tylenol and states his pain is better today. He denies any recent lifting or straining. He admits to some nausea but denies vomiting.   He states since surgery he has been dealing with constipation despite taking fiber daily and MiraLAX every other day. He only has a bowel movement about every 4 days. He was also placed on Reglan about 2 months ago as needed for possible slow transit. Some bariatric labs were obtained by PCP on 07/04/2021 and were normal. He is taking his multivitamin and calcium."  He was treated with augmentin at that time but his ab pain has progressed. This is worse with urination. He has not had bm in 14 days, had fevers, has passed flatus.  Ab pain became unmanageable at home leading him to seek help. He had a ct scan outside of hospital that shows complicated diverticulitis with abscess between sigmoid and bladder.  He does not have pneumaturia.    Past Medical History:  Diagnosis Date   Allergy    Anemia    Anxiety    Barrett's esophagus    Diverticulosis 01/11/2016   Family history of breast cancer    Family history of colon cancer 10/07/2018   Family history of melanoma    Family history of uterine cancer    GERD (gastroesophageal reflux disease)    Gout    Gout    History of kidney  stones    Hypertension    Iron deficiency anemia 05/28/2017   Nephrolithiasis 02/08/2014   Obesity     Past Surgical History:  Procedure Laterality Date   BARIATRIC SURGERY     Gastric sleeve   CERVICAL FUSION     CHOLECYSTECTOMY     COLONOSCOPY WITH PROPOFOL N/A 01/11/2016   Procedure: COLONOSCOPY WITH PROPOFOL;  Surgeon: Lollie Sails, MD;  Location: [facility] ENDOSCOPY;  Service: Endoscopy;  Laterality: N/A;   ESOPHAGOGASTRODUODENOSCOPY  01/11/2016   Procedure: ESOPHAGOGASTRODUODENOSCOPY (EGD);  Surgeon: Lollie Sails, MD;  Location: [facility] ENDOSCOPY;  Service: Endoscopy;;   ESOPHAGOGASTRODUODENOSCOPY (EGD) WITH PROPOFOL N/A 08/07/2017   Procedure: ESOPHAGOGASTRODUODENOSCOPY (EGD) WITH PROPOFOL;  Surgeon: Toledo, Benay Pike, MD;  Location: [facility] ENDOSCOPY;  Service: Gastroenterology;  Laterality: N/A;   ESOPHAGOGASTRODUODENOSCOPY (EGD) WITH PROPOFOL N/A 04/29/2019   Procedure: ESOPHAGOGASTRODUODENOSCOPY (EGD) WITH PROPOFOL;  Surgeon: Lollie Sails, MD;  Location: [facility] ENDOSCOPY;  Service: Endoscopy;  Laterality: N/A;   GASTRIC ROUX-EN-Y N/A 12/17/2020   Procedure: LAPAROSCOPIC ROUX-EN-Y GASTRIC BYPASS WITH UPPER ENDOSCOPY -CONVERSION FROM LAP SLEEVE GASTRECTOMY, Hiatal Hernia Repair;  Surgeon: Greer Pickerel, MD;  Location: [facility] ORS;  Service: General;  Laterality: N/A;   HIATAL HERNIA REPAIR N/A 12/17/2020   Procedure: HERNIA REPAIR HIATAL;  Surgeon: Greer Pickerel, MD;  Location: [facility] ORS;  Service: General;  Laterality: N/A;  LASIK     TONSILLECTOMY N/A 08/06/2016   Procedure: TONSILLECTOMY;  Surgeon: Clyde Canterbury, MD;  Location: [facility] ORS;  Service: ENT;  Laterality: N/A;   UPPER GASTROINTESTINAL ENDOSCOPY      Family History  Problem Relation Age of Onset   Alcohol abuse Mother    Cancer Mother        unk primaray- was in appendix, peritoneal,, and colon   Hypertension Mother    Uterine cancer Mother 30   Hyperlipidemia Father    Hypertension Father    Diabetes Father    Colon  cancer Father 35   Hypertension Maternal Grandmother    Alcohol abuse Maternal Grandmother    Breast cancer Maternal Grandmother        dx >50   Hypertension Maternal Grandfather    Alcohol abuse Maternal Grandfather    Melanoma Maternal Grandfather 93   Hypertension Paternal Grandmother    Kidney cancer Paternal Grandmother 1   Liver cancer Paternal Grandmother    Hypertension Paternal Grandfather    Social History:  reports that he has never smoked. He has never used smokeless tobacco. He reports that he does not drink alcohol and does not use drugs.  Allergies: No Known Allergies  No current facility-administered medications on file prior to encounter.   Current Outpatient Medications on File Prior to Encounter  Medication Sig Dispense Refill   ALPRAZolam (XANAX) 0.5 MG tablet Take 1 tablet (0.5 mg total) by mouth once daily as needed 30 tablet 5   amlodipine-olmesartan (AZOR) 10-20 MG tablet Take 1 tablet by mouth once daily 90 tablet 3   amoxicillin-clavulanate (AUGMENTIN) 875-125 MG tablet TAKE 1 TABLET BY MOUTH EVERY 12 (TWELVE) HOURS FOR 10 DAYS 20 tablet 0   amoxicillin-clavulanate (AUGMENTIN) 875-125 MG tablet Take 1 tablet (875 mg total) by mouth every 12 (twelve) hours for 7 days 14 tablet 0   CALCIUM CITRATE PO Take 600 mg by mouth 3 (three) times daily.     colchicine 0.6 MG tablet Take 1 tablet (0.6 mg total) by mouth 2 (two) times daily as needed (gout) 60 tablet 1   enoxaparin (LOVENOX) 60 MG/0.6ML injection INJECT 0.6 MLS (60 MG TOTAL) INTO THE SKIN EVERY 12 (TWELVE) HOURS FOR 14 DAYS. 8.4 mL 0   gabapentin (NEURONTIN) 100 MG capsule TAKE 2 CAPSULES (200 MG TOTAL) BY MOUTH EVERY 12 (TWELVE) HOURS. (Patient not taking: Reported on 01/02/2021) 20 capsule 0   hydrOXYzine (VISTARIL) 25 MG capsule Take 1 capsule (25 mg total) by mouth at bedtime as needed for anxiety. (Patient not taking: Reported on 01/02/2021) 30 capsule 1   indapamide (LOZOL) 2.5 MG tablet Take 1 tablet  (2.5 mg total) by mouth once daily 90 tablet 3   linaclotide (LINZESS) 72 MCG capsule Take 1 capsule (72 mcg total) by mouth once daily 30 capsule 1   metoCLOPramide (REGLAN) 5 MG tablet Take 1 tablet (5 mg total) by mouth at bedtime for 90 days 90 tablet 0   metoCLOPramide (REGLAN) 5 MG tablet Take 1 tablet (5 mg total) by mouth at bedtime for 90 days 90 tablet 3   metoprolol tartrate (LOPRESSOR) 25 MG tablet TAKE 1 TABLET BY MOUTH 2 (TWO) TIMES DAILY. 180 tablet 3   Multiple Vitamins-Minerals (MULTIVITAMIN ADULT, MINERALS, PO) Take by mouth. Bariatric with iron     ondansetron (ZOFRAN-ODT) 4 MG disintegrating tablet TAKE 1 TABLET (4 MG TOTAL) BY MOUTH EVERY 6 (SIX) HOURS AS NEEDED FOR NAUSEA OR VOMITING. 20 tablet 0  pantoprazole (PROTONIX) 40 MG tablet Take 40 mg by mouth 2 (two) times daily.     pantoprazole (PROTONIX) 40 MG tablet TAKE 1 TABLET BY MOUTH 2 (TWO) TIMES DAILY 180 tablet 3   phentermine 37.5 MG capsule Take 1 capsule (37.5 mg total) by mouth every morning before breakfast for 30 days 30 capsule 0   sertraline (ZOLOFT) 50 MG tablet Take 50 mg by mouth daily.     sertraline (ZOLOFT) 50 MG tablet TAKE 1 TABLET (50 MG TOTAL) BY MOUTH ONCE DAILY 90 tablet 3   sucralfate (CARAFATE) 1 g tablet TAKE 1 TABLET BY MOUTH 4 (FOUR) TIMES DAILY BEFORE MEALS AND NIGHTLY 120 tablet 11   venlafaxine XR (EFFEXOR-XR) 37.5 MG 24 hr capsule Take 1 capsule (37.5 mg total) by mouth once daily 30 capsule 11   venlafaxine XR (EFFEXOR-XR) 75 MG 24 hr capsule Take 1 capsule (75 mg total) by mouth once daily 90 capsule 3      Results for orders placed or performed during the hospital encounter of 10/24/21 (from the past 48 hour(s))  Lactic acid, plasma     Status: None   Collection Time: 10/24/21  9:30 PM  Result Value Ref Range   Lactic Acid, Venous 0.9 0.5 - 1.9 mmol/L    Comment: Performed at [facility], [place] [address] [place], [place] [address]  Comprehensive metabolic panel      Status: Abnormal   Collection Time: 10/24/21  9:30 PM  Result Value Ref Range   Sodium 134 (L) 135 - 145 mmol/L   Potassium 3.9 3.5 - 5.1 mmol/L   Chloride 103 98 - 111 mmol/L   CO2 23 22 - 32 mmol/L   Glucose, Bld 108 (H) 70 - 99 mg/dL    Comment: Glucose reference range applies only to samples taken after fasting for at least 8 hours.   BUN 9 6 - 20 mg/dL   Creatinine, Ser 1.08 0.61 - 1.24 mg/dL   Calcium 9.0 8.9 - 10.3 mg/dL   Total Protein 8.6 (H) 6.5 - 8.1 g/dL   Albumin 3.8 3.5 - 5.0 g/dL   AST 18 15 - 41 U/L   ALT 19 0 - 44 U/L   Alkaline Phosphatase 78 38 - 126 U/L   Total Bilirubin 1.4 (H) 0.3 - 1.2 mg/dL   GFR, Estimated >60 >60 mL/min    Comment: (NOTE) Calculated using the CKD-EPI Creatinine Equation (2021)    Anion gap 8 5 - 15    Comment: Performed at [facility], [place] [address], [place], [place] [address]  CBC WITH DIFFERENTIAL     Status: Abnormal   Collection Time: 10/24/21  9:30 PM  Result Value Ref Range   WBC 18.9 (H) 4.0 - 10.5 K/uL   RBC 5.42 4.22 - 5.81 MIL/uL   Hemoglobin 14.4 13.0 - 17.0 g/dL   HCT 44.9 39.0 - 52.0 %   MCV 82.8 80.0 - 100.0 fL   MCH 26.6 26.0 - 34.0 pg   MCHC 32.1 30.0 - 36.0 g/dL   RDW 13.2 11.5 - 15.5 %   Platelets 454 (H) 150 - 400 K/uL   nRBC 0.0 0.0 - 0.2 %   Neutrophils Relative % 79 %   Neutro Abs 14.8 (H) 1.7 - 7.7 K/uL   Lymphocytes Relative 12 %   Lymphs Abs 2.3 0.7 - 4.0 K/uL   Monocytes Relative 8 %   Monocytes Absolute 1.5 (H) 0.1 - 1.0 K/uL   Eosinophils  Relative 0 %   Eosinophils Absolute 0.1 0.0 - 0.5 K/uL   Basophils Relative 0 %   Basophils Absolute 0.1 0.0 - 0.1 K/uL   Immature Granulocytes 1 %   Abs Immature Granulocytes 0.10 (H) 0.00 - 0.07 K/uL    Comment: Performed at [facility], [place] [address] [place], [place] [address]  Protime-INR     Status: Abnormal   Collection Time: 10/24/21  9:30 PM  Result Value Ref Range   Prothrombin Time 15.3 (H) 11.4 - 15.2  seconds   INR 1.2 0.8 - 1.2    Comment: (NOTE) INR goal varies based on device and disease states. Performed at [facility], [place] [address] [place], [place] [address]   APTT     Status: Abnormal   Collection Time: 10/24/21  9:30 PM  Result Value Ref Range   aPTT 49 (H) 24 - 36 seconds    Comment:        IF BASELINE aPTT IS ELEVATED, SUGGEST PATIENT RISK ASSESSMENT BE USED TO DETERMINE APPROPRIATE ANTICOAGULANT THERAPY. Performed at [facility], [place] [address], [place], [place] [address]   Resp Panel by RT-PCR (Flu A&B, Covid) Nasopharyngeal Swab     Status: None   Collection Time: 10/24/21 10:02 PM   Specimen: Nasopharyngeal Swab; Nasopharyngeal(NP) swabs in vial transport medium  Result Value Ref Range   SARS Coronavirus 2 by RT PCR NEGATIVE NEGATIVE    Comment: (NOTE) SARS-CoV-2 target nucleic acids are NOT DETECTED.  The SARS-CoV-2 RNA is generally detectable in upper respiratory specimens during the acute phase of infection. The lowest concentration of SARS-CoV-2 viral copies this assay can detect is 138 copies/mL. A negative result does not preclude SARS-Cov-2 infection and should not be used as the sole basis for treatment or other patient management decisions. A negative result may occur with  improper specimen collection/handling, submission of specimen other than nasopharyngeal swab, presence of viral mutation(s) within the areas targeted by this assay, and inadequate number of viral copies(<138 copies/mL). A negative result must be combined with clinical observations, patient history, and epidemiological information. The expected result is Negative.  Fact Sheet for Patients:  EntrepreneurPulse.com.au  Fact Sheet for Healthcare Providers:  IncredibleEmployment.be  This test is no t yet approved or cleared by the [place] FDA and  has been authorized for detection and/or diagnosis of  SARS-CoV-2 by FDA under an Emergency Use Authorization (EUA). This EUA will remain  in effect (meaning this test can be used) for the duration of the COVID-19 declaration under Section 564(b)(1) of the Act, 21 U.S.C.section 360bbb-3(b)(1), unless the authorization is terminated  or revoked sooner.       Influenza A by PCR NEGATIVE NEGATIVE   Influenza B by PCR NEGATIVE NEGATIVE    Comment: (NOTE) The Xpert Xpress SARS-CoV-2/FLU/RSV plus assay is intended as an aid in the diagnosis of influenza from Nasopharyngeal swab specimens and should not be used as a sole basis for treatment. Nasal washings and aspirates are unacceptable for Xpert Xpress SARS-CoV-2/FLU/RSV testing.  Fact Sheet for Patients: EntrepreneurPulse.com.au  Fact Sheet for Healthcare Providers: IncredibleEmployment.be  This test is not yet approved or cleared by the [place] FDA and has been authorized for detection and/or diagnosis of SARS-CoV-2 by FDA under an Emergency Use Authorization (EUA). This EUA will remain in effect (meaning this test can be used) for the duration of the COVID-19 declaration under Section 564(b)(1) of the Act, 21 U.S.C. section 360bbb-3(b)(1), unless the  authorization is terminated or revoked.  Performed at [facility], [place] [address], [place], [place] [address]    CT ABDOMEN PELVIS W CONTRAST  Result Date: 10/23/2021 CLINICAL DATA:  Fever. Lack of bowel movements. History of umbilical hernia repair. EXAM: CT ABDOMEN AND PELVIS WITH CONTRAST TECHNIQUE: Multidetector CT imaging of the abdomen and pelvis was performed using the standard protocol following bolus administration of intravenous contrast. CONTRAST:  167mL OMNIPAQUE IOHEXOL 300 MG/ML  SOLN COMPARISON:  10/01/2021 FINDINGS: Lower chest: Clear lung bases. Normal heart size without pericardial or pleural effusion. Surgical changes at the gastroesophageal junction consistent  with Roux-en-Y gastric bypass. Concurrent tiny hiatal hernia with contrast in the distal esophagus. Hepatobiliary: Poor contrast bolus, limiting evaluation of previously described indeterminate liver lesions. Example at 2.0 cm in the right hepatic lobe on 18/2. Cholecystectomy, without biliary ductal dilatation. Pancreas: Normal, without mass or ductal dilatation. Spleen: Normal in size, without focal abnormality. Adrenals/Urinary Tract: Normal adrenal glands. Bilateral punctate renal collecting system calculi. Lower pole right renal exophytic lesion measures slightly greater than fluid density and is new compared to 2015. No air within the urinary bladder. Pericystic edema is increased including on 78/2. 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 Wall thickening of and edema surrounding the sigmoid is increased. Development of a complex fluid collection positioned between the sigmoid and left side of the urinary bladder dome. Example 4.8 x 4.2 cm on 74/2. Minimal gas within. Favor fistulous communication between the proximal and distal sigmoid including on 71/2. The colon is stool-filled proximal to this site, suggesting upstream constipation and possible low-grade obstruction. Normal terminal ileum and appendix. Normal small bowel. Vascular/Lymphatic: Normal caliber of the aorta and branch vessels. No abdominopelvic adenopathy. Reproductive: Normal prostate. Other: No significant free fluid. No free intraperitoneal air. Tiny fat containing ventral abdominal wall hernia. Musculoskeletal: No acute osseous abnormality. IMPRESSION: 1. Progressive, now complicated sigmoid diverticulitis, as evidenced by developing abscess caudal to the sigmoid and cephalad to the urinary bladder. Surrounding pelvic and pericystic edema, without well-defined colonic to bladder fistula. This cannot however, be entirely excluded. 2. 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 3. Upstream stool volume  suggests constipation or low-grade obstruction at the level of the sigmoid. 4. Bilateral nephrolithiasis. 5. 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 Esophageal air fluid level suggests dysmotility or gastroesophageal reflux. These results will be called to the ordering clinician or representative by the Radiology Department at the imaging location. Electronically Signed   By: Abigail Miyamoto M.D.   On: 10/23/2021 20:44   DG Chest Port 1 View  Result Date: 10/24/2021 CLINICAL DATA:  Sepsis EXAM: PORTABLE CHEST 1 VIEW COMPARISON:  09/04/2020 FINDINGS: The heart size and mediastinal contours are within normal limits. Both lungs are clear. The visualized skeletal structures are unremarkable. IMPRESSION: No active disease. Electronically Signed   By: Fidela Salisbury M.D.   On: 10/24/2021 21:21    Review of Systems  Constitutional:  Positive for fatigue and fever.  Gastrointestinal:  Positive for abdominal pain, constipation and nausea. Negative for vomiting.  Genitourinary:  Positive for dysuria.  All other systems reviewed and are negative.  Blood pressure (!) 174/104, pulse 94, temperature (!) 100.9 F (38.3 C), temperature source Oral, resp. rate 16, height 5\' 8"  (1.727 m), weight (!) 140.6 kg, SpO2 98 %. Physical Exam Constitutional:      Appearance: Normal appearance.  Eyes:     General: No scleral icterus. Cardiovascular:     Rate and Rhythm:  Normal rate.  Pulmonary:     Effort: Pulmonary effort is normal.  Abdominal:     General: A surgical scar is present.     Palpations: Abdomen is soft.     Tenderness: There is abdominal tenderness in the suprapubic area. There is no guarding.  Neurological:     Mental Status: He is alert.  Psychiatric:        Mood and Affect: Mood normal.        Behavior: Behavior normal.     Assessment/Plan Diverticulitis with abscess S/P gastric bypass -admission, NPO, iv abx -will ask IR to look at imaging although I am not entirely sure this can  be drained -UA pending, clinically doesn't sound like cv fistula but does have irritation related to this -lovenox, scds   MDM  Rolm Bookbinder, MD 10/24/2021, 11:25 PM

## 2021-10-24 NOTE — ED Triage Notes (Signed)
Pt had an abnormal CT scan yesterday. Pt last had gastric bypass last Feb. Pt states his CT scan shows a bowel obstruction. Pt c/o abdominal pain, nausea. No bowel movement since 12/23. Pt has had intermittent fevers. 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

## 2021-10-25 LAB — URINALYSIS, ROUTINE W REFLEX MICROSCOPIC
Bilirubin Urine: NEGATIVE
Glucose, UA: NEGATIVE mg/dL
Hgb urine dipstick: NEGATIVE
Ketones, ur: 80 mg/dL — AB
Nitrite: NEGATIVE
Protein, ur: 30 mg/dL — AB
Specific Gravity, Urine: 1.021 (ref 1.005–1.030)
WBC, UA: >50 WBC/hpf — ABNORMAL HIGH (ref 0–5)
pH: 5 (ref 5.0–8.0)

## 2021-10-25 LAB — CBC
HCT: 41.1 % (ref 39.0–52.0)
Hemoglobin: 13.2 g/dL (ref 13.0–17.0)
MCH: 26.9 pg (ref 26.0–34.0)
MCHC: 32.1 g/dL (ref 30.0–36.0)
MCV: 83.9 fL (ref 80.0–100.0)
Platelets: 393 10*3/uL (ref 150–400)
RBC: 4.9 MIL/uL (ref 4.22–5.81)
RDW: 13.2 % (ref 11.5–15.5)
WBC: 13.6 10*3/uL — ABNORMAL HIGH (ref 4.0–10.5)
nRBC: 0 % (ref 0.0–0.2)

## 2021-10-25 LAB — BASIC METABOLIC PANEL
Anion gap: 7 (ref 5–15)
BUN: 9 mg/dL (ref 6–20)
CO2: 25 mmol/L (ref 22–32)
Calcium: 8.6 mg/dL — ABNORMAL LOW (ref 8.9–10.3)
Chloride: 104 mmol/L (ref 98–111)
Creatinine, Ser: 0.93 mg/dL (ref 0.61–1.24)
GFR, Estimated: >60 mL/min (ref >60)
Glucose, Bld: 104 mg/dL — ABNORMAL HIGH (ref 70–99)
Potassium: 3.8 mmol/L (ref 3.5–5.1)
Sodium: 136 mmol/L (ref 135–145)

## 2021-10-25 MED ORDER — VENLAFAXINE HCL ER 75 MG PO CP24
75.0000 mg | ORAL_CAPSULE | Freq: Every day | ORAL | Status: DC
  Administered 2021-10-25 – 2021-10-30 (×6): 75 mg via ORAL
  Filled 2021-10-25 (×6): qty 1

## 2021-10-25 MED ORDER — AMLODIPINE BESYLATE 10 MG PO TABS
10.0000 mg | ORAL_TABLET | Freq: Every day | ORAL | Status: DC
  Administered 2021-10-25 – 2021-10-30 (×6): 10 mg via ORAL
  Filled 2021-10-25: qty 1
  Filled 2021-10-25: qty 2
  Filled 2021-10-25 (×4): qty 1

## 2021-10-25 MED ORDER — IRBESARTAN 150 MG PO TABS
150.0000 mg | ORAL_TABLET | Freq: Every day | ORAL | Status: DC
  Administered 2021-10-25 – 2021-10-30 (×6): 150 mg via ORAL
  Filled 2021-10-25 (×6): qty 1

## 2021-10-25 NOTE — Progress Notes (Signed)
[facility] Progress Note     Subjective: CC:  Reports slight improvement in LLQ and suprapubic abd pain at rest, pain to palpation about the same. His cc is severe SP/LLQ pain with urination. This pain also radiates to his perineum/groin. No BM in 14 days.  Objective: Vital signs in last 24 hours: Temp:  [98.3 F (36.8 C)-100.9 F (38.3 C)] 98.3 F (36.8 C) (01/06 0300) Pulse Rate:  [57-94] 66 (01/06 0800) Resp:  [9-24] 14 (01/06 0800) BP: (123-174)/(60-104) 129/77 (01/06 0800) SpO2:  [92 %-99 %] 96 % (01/06 0800) Weight:  [140.6 kg] 140.6 kg (01/05 2017)    Intake/Output from previous day: No intake/output data recorded. Intake/Output this shift: No intake/output data recorded.  PE: Gen:  Alert, NAD, pleasant Card:  Regular rate and rhythm, pedal pulses 2+ BL Pulm:  Normal effort, clear to auscultation bilaterally Abd: Soft, TTP left hemi-abdomen and suprapubic region without guarding or peritonitis. Skin: warm and dry, no rashes  Psych: A&Ox3   Lab Results:  Recent Labs    10/24/21 2130 10/25/21 0308  WBC 18.9* 13.6*  HGB 14.4 13.2  HCT 44.9 41.1  PLT 454* 393   BMET Recent Labs    10/24/21 2130 10/25/21 0308  NA 134* 136  K 3.9 3.8  CL 103 104  CO2 23 25  GLUCOSE 108* 104*  BUN 9 9  CREATININE 1.08 0.93  CALCIUM 9.0 8.6*   PT/INR Recent Labs    10/24/21 2130  LABPROT 15.3*  INR 1.2   CMP     Component Value Date/Time   NA 136 10/25/2021 0308   NA 138 05/02/2014 0711   K 3.8 10/25/2021 0308   K 3.7 05/02/2014 0711   CL 104 10/25/2021 0308   CL 103 05/02/2014 0711   CO2 25 10/25/2021 0308   CO2 27 05/02/2014 0711   GLUCOSE 104 (H) 10/25/2021 0308   GLUCOSE 97 05/02/2014 0711   BUN 9 10/25/2021 0308   BUN 14 05/02/2014 0711   CREATININE 0.93 10/25/2021 0308   CREATININE 0.86 05/02/2014 0711   CALCIUM 8.6 (L) 10/25/2021 0308   CALCIUM 9.1 05/02/2014 0711   PROT 8.6 (H) 10/24/2021 2130   PROT 8.2 05/02/2014 0711    ALBUMIN 3.8 10/24/2021 2130   ALBUMIN 3.9 05/02/2014 0711   AST 18 10/24/2021 2130   AST 29 05/02/2014 0711   ALT 19 10/24/2021 2130   ALT 40 05/02/2014 0711   ALKPHOS 78 10/24/2021 2130   ALKPHOS 61 05/02/2014 0711   BILITOT 1.4 (H) 10/24/2021 2130   BILITOT 0.6 05/02/2014 0711   GFRNONAA >60 10/25/2021 0308   GFRNONAA >60 05/02/2014 0711   GFRAA >60 06/08/2019 1329   GFRAA >60 05/02/2014 0711   Lipase     Component Value Date/Time   LIPASE 96 12/09/2012 0747       Studies/Results: CT ABDOMEN PELVIS W CONTRAST  Result Date: 10/23/2021 CLINICAL DATA:  Fever. Lack of bowel movements. History of umbilical hernia repair. EXAM: CT ABDOMEN AND PELVIS WITH CONTRAST TECHNIQUE: Multidetector CT imaging of the abdomen and pelvis was performed using the standard protocol following bolus administration of intravenous contrast. CONTRAST:  150mL OMNIPAQUE IOHEXOL 300 MG/ML  SOLN COMPARISON:  10/01/2021 FINDINGS: Lower chest: Clear lung bases. Normal heart size without pericardial or pleural effusion. 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 Concurrent tiny hiatal hernia with contrast in the distal esophagus. Hepatobiliary: Poor contrast bolus, limiting evaluation of previously described indeterminate liver lesions.  Example at 2.0 cm in the right hepatic lobe on 18/2. Cholecystectomy, without biliary ductal dilatation. Pancreas: Normal, without mass or ductal dilatation. Spleen: Normal in size, without focal abnormality. Adrenals/Urinary Tract: Normal adrenal glands. Bilateral punctate renal collecting system calculi. Lower pole right renal exophytic lesion measures slightly greater than fluid density and is new compared to 2015. No air within the urinary bladder. Pericystic edema is increased including on 78/2. 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 Wall thickening of and edema surrounding the sigmoid is increased.  Development of a complex fluid collection positioned between the sigmoid and left side of the urinary bladder dome. Example 4.8 x 4.2 cm on 74/2. Minimal gas within. Favor fistulous communication between the proximal and distal sigmoid including on 71/2. The colon is stool-filled proximal to this site, suggesting upstream constipation and possible low-grade obstruction. Normal terminal ileum and appendix. Normal small bowel. Vascular/Lymphatic: Normal caliber of the aorta and branch vessels. No abdominopelvic adenopathy. Reproductive: Normal prostate. Other: No significant free fluid. No free intraperitoneal air. Tiny fat containing ventral abdominal wall hernia. Musculoskeletal: No acute osseous abnormality. IMPRESSION: 1. Progressive, now complicated sigmoid diverticulitis, as evidenced by developing abscess caudal to the sigmoid and cephalad to the urinary bladder. Surrounding pelvic and pericystic edema, without well-defined colonic to bladder fistula. This cannot however, be entirely excluded. 2. 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 3. Upstream stool volume suggests constipation or low-grade obstruction at the level of the sigmoid. 4. Bilateral nephrolithiasis. 5. 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 Esophageal air fluid level suggests dysmotility or gastroesophageal reflux. These results will be called to the ordering clinician or representative by the Radiology Department at the imaging location. Electronically Signed   By: Abigail Miyamoto M.D.   On: 10/23/2021 20:44   DG Chest Port 1 View  Result Date: 10/24/2021 CLINICAL DATA:  Sepsis EXAM: PORTABLE CHEST 1 VIEW COMPARISON:  09/04/2020 FINDINGS: The heart size and mediastinal contours are within normal limits. Both lungs are clear. The visualized skeletal structures are unremarkable. IMPRESSION: No active disease. Electronically Signed   By: Fidela Salisbury M.D.   On: 10/24/2021 21:21    Anti-infectives: Anti-infectives  (From admission, onward)    Start     Dose/Rate Route Frequency Ordered Stop   10/24/21 2200  piperacillin-tazobactam (ZOSYN) IVPB 3.375 g        3.375 g 12.5 mL/hr over 240 Minutes Intravenous Every 8 hours 10/24/21 2110         Assessment/Plan  Complicated sigmoid diverticulitis with abscess  - low grad temp 100.9, WBC 13.6 from 18.9 - IR consult to see if there is a possible window for drainage of abscess  - no emergent need for surgery. Continue IV antibiotics and bowel rest.  - UA with leuks and bacteria. UCx pending. No CT evidence of CV fistula at this time, no pneumaturia.  - hopefully he will improve with non-operative management and can be seen in the outpatient setting to plan elective partial colectomy. Failure to improve would likely result in ex lap, partial colectomy, end colostomy.   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 [name] HTN - home lopressor  Obesity  PMH gout  Anxiety - home effexor/zoloft    LOS: 1 day    Obie Dredge, [facility] Please see Amion for pager number during day hours 7:00am-4:30pm

## 2021-10-25 NOTE — Consult Note (Signed)
Chief Complaint: Diverticulitis abscess. Request is for aspiration - drain placement  Referring Physician(s): Loran Senters PA  Supervising Physician: Corrie Mckusick  Patient Status: [facility] - In-pt  History of Present Illness: Nathaniel Curry is a 41 y.o. 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 Presented to the ED at [facility] with fevers and consitipation. CT Progressive, now complicated sigmoid diverticulitis, as evidenced by developing abscess caudal to the sigmoid and cephalad to the urinary bladder. Surrounding pelvic and pericystic edema, without well-defined colonic to bladder fistula. This cannot however, be entirely excluded. 2. 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 Team is requesting an aspiration - drain placement.     Past Medical History:  Diagnosis Date   Allergy    Anemia    Anxiety    Barrett's esophagus    Diverticulosis 01/11/2016   Family history of breast cancer    Family history of colon cancer 10/07/2018   Family history of melanoma    Family history of uterine cancer    GERD (gastroesophageal reflux disease)    Gout    Gout    History of kidney stones    Hypertension    Iron deficiency anemia 05/28/2017   Nephrolithiasis 02/08/2014   Obesity     Past Surgical History:  Procedure Laterality Date   BARIATRIC SURGERY     Gastric sleeve   CERVICAL FUSION     CHOLECYSTECTOMY     COLONOSCOPY WITH PROPOFOL N/A 01/11/2016   Procedure: COLONOSCOPY WITH PROPOFOL;  Surgeon: Lollie Sails, MD;  Location: [facility] ENDOSCOPY;  Service: Endoscopy;  Laterality: N/A;   ESOPHAGOGASTRODUODENOSCOPY  01/11/2016   Procedure: ESOPHAGOGASTRODUODENOSCOPY (EGD);  Surgeon: Lollie Sails, MD;  Location: [facility] ENDOSCOPY;  Service: Endoscopy;;   ESOPHAGOGASTRODUODENOSCOPY (EGD) WITH PROPOFOL N/A 08/07/2017   Procedure: ESOPHAGOGASTRODUODENOSCOPY (EGD) WITH PROPOFOL;  Surgeon: Toledo, Benay Pike, MD;  Location: [facility] ENDOSCOPY;  Service:  Gastroenterology;  Laterality: N/A;   ESOPHAGOGASTRODUODENOSCOPY (EGD) WITH PROPOFOL N/A 04/29/2019   Procedure: ESOPHAGOGASTRODUODENOSCOPY (EGD) WITH PROPOFOL;  Surgeon: Lollie Sails, MD;  Location: [facility] ENDOSCOPY;  Service: Endoscopy;  Laterality: N/A;   GASTRIC ROUX-EN-Y N/A 12/17/2020   Procedure: LAPAROSCOPIC ROUX-EN-Y GASTRIC BYPASS WITH UPPER ENDOSCOPY -CONVERSION FROM LAP SLEEVE GASTRECTOMY, Hiatal Hernia Repair;  Surgeon: Greer Pickerel, MD;  Location: [facility] ORS;  Service: General;  Laterality: N/A;   HIATAL HERNIA REPAIR N/A 12/17/2020   Procedure: HERNIA REPAIR HIATAL;  Surgeon: Greer Pickerel, MD;  Location: [facility];  Service: General;  Laterality: N/A;   LASIK     TONSILLECTOMY N/A 08/06/2016   Procedure: TONSILLECTOMY;  Surgeon: Clyde Canterbury, MD;  Location: [facility] ORS;  Service: ENT;  Laterality: N/A;   UPPER GASTROINTESTINAL ENDOSCOPY      Allergies: Patient has no known allergies.  Medications: Prior to Admission medications   Medication Sig Start Date End Date Taking? Authorizing Provider  ALPRAZolam Duanne Moron) 0.5 MG tablet Take 1 tablet (0.5 mg total) by mouth once daily as needed Patient taking differently: Take 0.5 mg by mouth daily as needed for anxiety. 07/29/21  Yes   amlodipine-olmesartan (AZOR) 10-20 MG tablet Take 1 tablet by mouth once daily 07/29/21  Yes   CALCIUM CITRATE PO Take 600 mg by mouth 3 (three) times daily.   Yes [provider]  indapamide (LOZOL) 2.5 MG tablet Take 1 tablet (2.5 mg total) by mouth once daily 07/29/21  Yes   linaclotide (LINZESS) 72 MCG capsule Take 1 capsule (72 mcg total) by mouth once  daily 09/30/21  Yes   metoCLOPramide (REGLAN) 5 MG tablet Take 1 tablet (5 mg total) by mouth at bedtime for 90 days 08/16/21  Yes   Multiple Vitamins-Minerals (MULTIVITAMIN ADULT, MINERALS, PO) Take 1 tablet by mouth daily. Bariatric with iron   Yes [provider]  pantoprazole (PROTONIX) 40 MG tablet TAKE 1 TABLET BY MOUTH 2 (TWO) TIMES  DAILY Patient taking differently: Take 40 mg by mouth 2 (two) times daily as needed (for acid reflux). 07/27/20 10/24/22 Yes Rusty Aus, MD  sucralfate (CARAFATE) 1 g tablet TAKE 1 TABLET BY MOUTH 4 (FOUR) TIMES DAILY BEFORE MEALS AND NIGHTLY Patient taking differently: Take 1 g by mouth 2 (two) times daily as needed (for acid reflux). 07/27/20 10/24/22 Yes Rusty Aus, MD  venlafaxine XR (EFFEXOR-XR) 75 MG 24 hr capsule Take 1 capsule (75 mg total) by mouth once daily 08/16/21  Yes   colchicine 0.6 MG tablet Take 1 tablet (0.6 mg total) by mouth 2 (two) times daily as needed (gout) Patient not taking: Reported on 10/24/2021 07/29/21     metoprolol tartrate (LOPRESSOR) 25 MG tablet TAKE 1 TABLET BY MOUTH 2 (TWO) TIMES DAILY. Patient not taking: Reported on 10/24/2021 12/18/20 12/18/21  Greer Pickerel, MD  phentermine 37.5 MG capsule Take 1 capsule (37.5 mg total) by mouth every morning before breakfast for 30 days Patient not taking: Reported on 10/24/2021 10/02/21     sertraline (ZOLOFT) 50 MG tablet Take 50 mg by mouth daily. Patient not taking: Reported on 10/24/2021 07/27/20   [provider]  venlafaxine XR (EFFEXOR-XR) 37.5 MG 24 hr capsule Take 1 capsule (37.5 mg total) by mouth once daily Patient not taking: Reported on 10/24/2021 07/29/21        Family History  Problem Relation Age of Onset   Alcohol abuse Mother    Cancer Mother        unk primaray- was in appendix, peritoneal,, and colon   Hypertension Mother    Uterine cancer Mother 35   Hyperlipidemia Father    Hypertension Father    Diabetes Father    Colon cancer Father 39   Hypertension Maternal Grandmother    Alcohol abuse Maternal Grandmother    Breast cancer Maternal Grandmother        dx >50   Hypertension Maternal Grandfather    Alcohol abuse Maternal Grandfather    Melanoma Maternal Grandfather 75   Hypertension Paternal Grandmother    Kidney cancer Paternal Grandmother 72   Liver cancer Paternal Grandmother     Hypertension Paternal Grandfather     Social History   Socioeconomic History   Marital status: Single    Spouse name: Not on file   Number of children: Not on file   Years of education: Not on file   Highest education level: Not on file  Occupational History    Employer: [employer]  Tobacco Use   Smoking status: Never   Smokeless tobacco: Never  Vaping Use   Vaping Use: Never used  Substance and Sexual Activity   Alcohol use: No    Alcohol/week: 0.0 standard drinks   Drug use: No   Sexual activity: Never  Other Topics Concern   Not on file  Social History Narrative   Not on file   Social Determinants of Health   Financial Resource Strain: Not on file  Food Insecurity: Not on file  Transportation Needs: Not on file  Physical Activity: Not on file  Stress: Not on  file  Social Connections: Not on file    Review of Systems: A 12 point ROS discussed and pertinent positives are indicated in the HPI above.  All other systems are negative.  Review of Systems  Constitutional:  Negative for fever.  HENT:  Negative for congestion.   Respiratory:  Negative for cough and shortness of breath.   Cardiovascular:  Negative for chest pain.  Gastrointestinal:  Positive for abdominal pain (radiates to prostate).  Neurological:  Negative for headaches.  Psychiatric/Behavioral:  Negative for behavioral problems and confusion.    Vital Signs: BP 136/73    Pulse (!) 55    Temp 98.3 F (36.8 C) (Oral)    Resp 20    Ht 5\' 8"  (1.727 m)    Wt (!) 310 lb (140.6 kg)    SpO2 94%    BMI 47.14 kg/m   Physical Exam Vitals and nursing note reviewed.  Constitutional:      Appearance: He is well-developed.  HENT:     Head: Normocephalic.  Cardiovascular:     Rate and Rhythm: Normal rate and regular rhythm.     Heart sounds: Normal heart sounds.  Pulmonary:     Effort: Pulmonary effort is normal.     Breath sounds: Normal breath sounds.  Musculoskeletal:         General: Normal range of motion.     Cervical back: Normal range of motion.  Skin:    General: Skin is dry.  Neurological:     Mental Status: He is alert and oriented to person, place, and time.    Imaging: CT ABDOMEN PELVIS W CONTRAST  Result Date: 10/23/2021 CLINICAL DATA:  Fever. Lack of bowel movements. History of umbilical hernia repair. EXAM: CT ABDOMEN AND PELVIS WITH CONTRAST TECHNIQUE: Multidetector CT imaging of the abdomen and pelvis was performed using the standard protocol following bolus administration of intravenous contrast. CONTRAST:  134mL OMNIPAQUE IOHEXOL 300 MG/ML  SOLN COMPARISON:  10/01/2021 FINDINGS: Lower chest: Clear lung bases. Normal heart size without pericardial or pleural effusion. 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 Concurrent tiny hiatal hernia with contrast in the distal esophagus. Hepatobiliary: Poor contrast bolus, limiting evaluation of previously described indeterminate liver lesions. Example at 2.0 cm in the right hepatic lobe on 18/2. Cholecystectomy, without biliary ductal dilatation. Pancreas: Normal, without mass or ductal dilatation. Spleen: Normal in size, without focal abnormality. Adrenals/Urinary Tract: Normal adrenal glands. Bilateral punctate renal collecting system calculi. Lower pole right renal exophytic lesion measures slightly greater than fluid density and is new compared to 2015. No air within the urinary bladder. Pericystic edema is increased including on 78/2. 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 Wall thickening of and edema surrounding the sigmoid is increased. Development of a complex fluid collection positioned between the sigmoid and left side of the urinary bladder dome. Example 4.8 x 4.2 cm on 74/2. Minimal gas within. Favor fistulous communication between the proximal and distal sigmoid including on 71/2. The colon is stool-filled proximal to this site,  suggesting upstream constipation and possible low-grade obstruction. Normal terminal ileum and appendix. Normal small bowel. Vascular/Lymphatic: Normal caliber of the aorta and branch vessels. No abdominopelvic adenopathy. Reproductive: Normal prostate. Other: No significant free fluid. No free intraperitoneal air. Tiny fat containing ventral abdominal wall hernia. Musculoskeletal: No acute osseous abnormality. IMPRESSION: 1. Progressive, now complicated sigmoid diverticulitis, as evidenced by developing abscess caudal to the sigmoid and cephalad to the urinary bladder. Surrounding pelvic and pericystic edema,  without well-defined colonic to bladder fistula. This cannot however, be entirely excluded. 2. 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 3. Upstream stool volume suggests constipation or low-grade obstruction at the level of the sigmoid. 4. Bilateral nephrolithiasis. 5. 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 Esophageal air fluid level suggests dysmotility or gastroesophageal reflux. These results will be called to the ordering clinician or representative by the Radiology Department at the imaging location. Electronically Signed   By: Abigail Miyamoto M.D.   On: 10/23/2021 20:44   CT ABDOMEN PELVIS W CONTRAST  Result Date: 10/01/2021 CLINICAL DATA:  Umbilical pain with bloody drainage, known umbilical hernia, history of hiatal hernia, gastric sleeve resection, cholecystectomy, hypertension, GERD EXAM: CT ABDOMEN AND PELVIS WITH CONTRAST TECHNIQUE: Multidetector CT imaging of the abdomen and pelvis was performed using the standard protocol following bolus administration of intravenous contrast. CONTRAST:  172mL OMNIPAQUE IOHEXOL 300 MG/ML SOLN IV. Dilute oral contrast. COMPARISON:  12/26/2013 FINDINGS: Lower chest: Lung bases clear Hepatobiliary: Gallbladder surgically absent. Intermediate attenuation 2.8 x 2.5 cm diameter lesion medial RIGHT lobe liver image 23 previously 2.4 x 2.2  cm. Additional lateral RIGHT lobe liver lesion 2.1 x 1.9 cm a image 21, previously 1.4 x 1.3 cm. No additional hepatic masses. No biliary dilatation. Pancreas: Normal appearance Spleen: 13 mmlesion inferior spleen, image 31, new, indeterminate. Adrenals/Urinary Tract: Tiny BILATERAL nonobstructing renal calculi. Adrenal glands, kidneys, and ureters otherwise normal appearance. Wall thickening of the superior LEFT bladder, see below. Stomach/Bowel: Normal appendix. Large diverticulum identified RIGHT arising from the posterior margin of the mid sigmoid colon, diverticulum abutting the LEFT anterior superior margin of the urinary bladder, associated with bladder wall thickening and infiltration of perivesical fat suggesting focal inflammatory process. This could represent sigmoid diverticulitis with secondary involvement of the bladder wall. No definite air within the bladder lumen to confirm fistula. Prior gastric sleeve resection. Remaining bowel loops unremarkable. Vascular/Lymphatic: Vascular structures patent. Aorta normal caliber. No adenopathy. Reproductive: Unremarkable prostate gland and seminal vesicles Other: Small umbilical hernia containing fat. Minimal skin thickening at adjacent umbilicus with minimal fluid and single tiny focus of gas question tiny abscess at umbilicus 18 x 9 x 11 mm. No bowel herniation. No free air or free fluid. Musculoskeletal: Unremarkable IMPRESSION: Small umbilical hernia containing fat. Minimal skin thickening at adjacent umbilicus with minimal fluid and single tiny focus of gas question tiny abscess at umbilicus 18 x 9 x 11 mm. Large diverticulum arising from the posterior margin of the mid sigmoid colon, abutting the LEFT anterior superior margin of the urinary bladder, associated with bladder wall thickening and infiltration of perivesical fat suggesting focal inflammatory process; this could represent sigmoid diverticulitis with secondary involvement of the bladder wall. No  definite air within the bladder lumen to suggest fistula. Two indeterminate liver lesions increased in size since 2015 with additional new nonspecific lesion at inferior pole of spleen; follow-up non emergent characterization of these indeterminate lesions by MR imaging recommended. Electronically Signed   By: Lavonia Dana M.D.   On: 10/01/2021 13:05   DG Chest Port 1 View  Result Date: 10/24/2021 CLINICAL DATA:  Sepsis EXAM: PORTABLE CHEST 1 VIEW COMPARISON:  09/04/2020 FINDINGS: The heart size and mediastinal contours are within normal limits. Both lungs are clear. The visualized skeletal structures are unremarkable. IMPRESSION: No active disease. Electronically Signed   By: Fidela Salisbury M.D.   On: 10/24/2021 21:21    Labs:  CBC: Recent Labs    12/07/20 0954 12/17/20 1354 12/18/20  7425 10/24/21 2130 10/25/21 0308  WBC 10.1  --  14.9* 18.9* 13.6*  HGB 14.4 14.1 13.0 14.4 13.2  HCT 46.3 45.2 41.4 44.9 41.1  PLT 426*  --  344 454* 393    COAGS: Recent Labs    10/24/21 2130  INR 1.2  APTT 49*    BMP: Recent Labs    12/07/20 0954 12/18/20 0431 10/01/21 1229 10/24/21 2130 10/25/21 0308  NA 139 136  --  134* 136  K 4.2 3.9  --  3.9 3.8  CL 102 101  --  103 104  CO2 25 25  --  23 25  GLUCOSE 105* 139*  --  108* 104*  BUN 22* 13  --  9 9  CALCIUM 9.6 9.0  --  9.0 8.6*  CREATININE 1.28* 1.07 1.20 1.08 0.93  GFRNONAA >60 >60  --  >60 >60    LIVER FUNCTION TESTS: Recent Labs    12/07/20 0954 12/18/20 0431 10/24/21 2130  BILITOT 1.0 1.0 1.4*  AST 25 265* 18  ALT 23 242* 19  ALKPHOS 59 60 78  PROT 8.3* 7.2 8.6*  ALBUMIN 4.3 3.6 3.8     Assessment and Plan:  41 y.o. Male. 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 Presented to the ED at [facility] with fevers and consitipation. CT Progressive, now complicated sigmoid diverticulitis, as evidenced by developing abscess caudal to the sigmoid and cephalad to the urinary bladder. Surrounding pelvic and pericystic  edema, without well-defined colonic to bladder fistula. This cannot however, be entirely excluded. 2. 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 Team is requesting an aspiration - drain placement.   WBC 13.6. Currently afebrile.Patient is on subcutaneous prophylactic dose of lovenox. Last dose given on 00:14 on 1.6.23. Patient has been NPO since midnight  Risks and benefits discussed with the patient including bleeding, infection, damage to adjacent structures, bowel perforation/fistula connection, and sepsis.  All of the patient's questions were answered, patient is agreeable to proceed.  Consent signed and in IR Board.    Thank you for this interesting consult.  I greatly enjoyed meeting Nathaniel Curry and look forward to participating in their care.  A copy of this report was sent to the requesting provider on this date.  Electronically Signed: Jacqualine Mau, NP 10/25/2021, 11:02 AM   I spent a total of 40 Minutes    in face to face in clinical consultation, greater than 50% of which was counseling/coordinating care for intra abdominal abscess aspiration possible drain placement.

## 2021-10-26 ENCOUNTER — Inpatient Hospital Stay (HOSPITAL_COMMUNITY): Payer: 59

## 2021-10-26 MED ORDER — MIDAZOLAM HCL 2 MG/2ML IJ SOLN
INTRAMUSCULAR | Status: AC | PRN
Start: 2021-10-26 — End: 2021-10-26
  Administered 2021-10-26 (×2): .5 mg via INTRAVENOUS
  Administered 2021-10-26 (×2): 1 mg via INTRAVENOUS
  Administered 2021-10-26 (×2): .5 mg via INTRAVENOUS

## 2021-10-26 MED ORDER — MIDAZOLAM HCL 2 MG/2ML IJ SOLN
INTRAMUSCULAR | Status: AC
  Filled 2021-10-26: qty 4

## 2021-10-26 MED ORDER — HYDROMORPHONE HCL 1 MG/ML IJ SOLN
1.0000 mg | INTRAMUSCULAR | Status: DC | PRN
  Administered 2021-10-26 – 2021-10-29 (×10): 1 mg via INTRAVENOUS
  Filled 2021-10-26 (×10): qty 1

## 2021-10-26 MED ORDER — PANTOPRAZOLE SODIUM 40 MG PO TBEC
40.0000 mg | DELAYED_RELEASE_TABLET | Freq: Two times a day (BID) | ORAL | Status: DC
  Administered 2021-10-26 – 2021-10-30 (×9): 40 mg via ORAL
  Filled 2021-10-26 (×9): qty 1

## 2021-10-26 MED ORDER — SODIUM CHLORIDE 0.9% FLUSH
5.0000 mL | Freq: Three times a day (TID) | INTRAVENOUS | Status: DC
  Administered 2021-10-27 – 2021-10-30 (×9): 5 mL

## 2021-10-26 MED ORDER — SUCRALFATE 1 G PO TABS
1.0000 g | ORAL_TABLET | Freq: Three times a day (TID) | ORAL | Status: DC | PRN
  Administered 2021-10-29: 1 g via ORAL
  Filled 2021-10-26: qty 1

## 2021-10-26 MED ORDER — SODIUM CHLORIDE 0.9 % IV SOLN
INTRAVENOUS | Status: AC | PRN
  Administered 2021-10-26: 10 mL/h via INTRAVENOUS

## 2021-10-26 MED ORDER — FENTANYL CITRATE (PF) 100 MCG/2ML IJ SOLN
INTRAMUSCULAR | Status: AC
  Filled 2021-10-26: qty 4

## 2021-10-26 MED ORDER — FENTANYL CITRATE (PF) 100 MCG/2ML IJ SOLN
INTRAMUSCULAR | Status: AC | PRN
Start: 2021-10-26 — End: 2021-10-26
  Administered 2021-10-26 (×4): 25 ug via INTRAVENOUS
  Administered 2021-10-26 (×2): 50 ug via INTRAVENOUS

## 2021-10-26 NOTE — Progress Notes (Addendum)
° °  Subjective/Chief Complaint: Drain was not placed yesterday by IR - unclear on the scheduling issues, but we are trying to contact Radiology  Still having significant LLQ and suprapubic tenderness.  Pain worse with urination but no pneumaturia or sediment in urine Still no BM Small amounts of flatus   Objective: Vital signs in last 24 hours: Temp:  [98.6 F (37 C)-98.8 F (37.1 C)] 98.6 F (37 C) (01/07 0538) Pulse Rate:  [55-84] 71 (01/07 0538) Resp:  [14-23] 20 (01/07 0538) BP: (111-142)/(62-79) 126/73 (01/07 0538) SpO2:  [92 %-100 %] 96 % (01/07 0538) Last BM Date: 10/11/21  Intake/Output from previous day: 01/06 0701 - 01/07 0700 In: 2423.5 [I.V.:2329.6; IV Piggyback:93.9] Out: 1200 [Urine:1200] Intake/Output this shift: No intake/output data recorded.  Gen:  Alert, NAD, pleasant Card:  Regular rate and rhythm, pedal pulses 2+ BL Pulm:  Normal effort, clear to auscultation bilaterally Abd: Soft, TTP left hemi-abdomen and suprapubic region without guarding or peritonitis. Skin: warm and dry, no rashes  Psych: A&Ox3   Lab Results:  Recent Labs    10/24/21 2130 10/25/21 0308  WBC 18.9* 13.6*  HGB 14.4 13.2  HCT 44.9 41.1  PLT 454* 393   BMET Recent Labs    10/24/21 2130 10/25/21 0308  NA 134* 136  K 3.9 3.8  CL 103 104  CO2 23 25  GLUCOSE 108* 104*  BUN 9 9  CREATININE 1.08 0.93  CALCIUM 9.0 8.6*   PT/INR Recent Labs    10/24/21 2130  LABPROT 15.3*  INR 1.2   ABG No results for input(s): PHART, HCO3 in the last 72 hours.  Invalid input(s): PCO2, PO2  Studies/Results: DG Chest Port 1 View  Result Date: 10/24/2021 CLINICAL DATA:  Sepsis EXAM: PORTABLE CHEST 1 VIEW COMPARISON:  09/04/2020 FINDINGS: The heart size and mediastinal contours are within normal limits. Both lungs are clear. The visualized skeletal structures are unremarkable. IMPRESSION: No active disease. Electronically Signed   By: Fidela Salisbury M.D.   On: 10/24/2021 21:21     Anti-infectives: Anti-infectives (From admission, onward)    Start     Dose/Rate Route Frequency Ordered Stop   10/24/21 2200  piperacillin-tazobactam (ZOSYN) IVPB 3.375 g        3.375 g 12.5 mL/hr over 240 Minutes Intravenous Every 8 hours 10/24/21 2110         Assessment/Plan:  Complicated sigmoid diverticulitis with abscess  - WBC 13.6 from 18.9 yesterday - recheck tomorrow - IR to drain pelvic abscess - no emergent need for surgery. Continue IV antibiotics and bowel rest.  - UA with leuks and bacteria. UCx pending. No CT evidence of CV fistula at this time, no pneumaturia.  - hopefully he will improve with non-operative management and can be seen in the outpatient setting to plan elective partial colectomy. Failure to improve would likely result in ex lap, partial colectomy, end colostomy.  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

## 2021-10-26 NOTE — Procedures (Signed)
Interventional Radiology Procedure Note  Procedure: CT ANTERIOR PELVIC ABSCESS DRAIN    Complications: None  Estimated Blood Loss:  MIN  Findings: 30CC PUS CX SENT 10FR DRAIN    Tamera Punt, MD

## 2021-10-27 LAB — CBC
HCT: 37.4 % — ABNORMAL LOW (ref 39.0–52.0)
Hemoglobin: 11.9 g/dL — ABNORMAL LOW (ref 13.0–17.0)
MCH: 27 pg (ref 26.0–34.0)
MCHC: 31.8 g/dL (ref 30.0–36.0)
MCV: 85 fL (ref 80.0–100.0)
Platelets: 350 10*3/uL (ref 150–400)
RBC: 4.4 MIL/uL (ref 4.22–5.81)
RDW: 13.4 % (ref 11.5–15.5)
WBC: 9.4 10*3/uL (ref 4.0–10.5)
nRBC: 0 % (ref 0.0–0.2)

## 2021-10-27 LAB — BASIC METABOLIC PANEL
Anion gap: 8 (ref 5–15)
BUN: <5 mg/dL — ABNORMAL LOW (ref 6–20)
CO2: 27 mmol/L (ref 22–32)
Calcium: 8.6 mg/dL — ABNORMAL LOW (ref 8.9–10.3)
Chloride: 101 mmol/L (ref 98–111)
Creatinine, Ser: 0.87 mg/dL (ref 0.61–1.24)
GFR, Estimated: >60 mL/min (ref >60)
Glucose, Bld: 100 mg/dL — ABNORMAL HIGH (ref 70–99)
Potassium: 3.9 mmol/L (ref 3.5–5.1)
Sodium: 136 mmol/L (ref 135–145)

## 2021-10-27 LAB — URINE CULTURE: Culture: >=100000 — AB

## 2021-10-27 NOTE — Progress Notes (Addendum)
Subjective/Chief Complaint: Drain placed yesterday - 30 cc of purulent fluid 150 cc total output since yesterday Low-grade temp last night - still with suprapubic/ LLQ tenderness No flatus or BM Mildly nauseated   Objective: Vital signs in last 24 hours: Temp:  [99.3 F (37.4 C)-100.5 F (38.1 C)] 99.3 F (37.4 C) (01/08 0458) Pulse Rate:  [72-100] 72 (01/08 0458) Resp:  [14-20] 20 (01/08 0458) BP: (135-162)/(73-95) 141/89 (01/08 0458) SpO2:  [91 %-100 %] 91 % (01/08 0458) Last BM Date: 10/11/21  Intake/Output from previous day: 01/07 0701 - 01/08 0700 In: 2559.3 [I.V.:2385.7; IV Piggyback:173.5] Out: 1598 [SAYTK:1601; Drains:123] Intake/Output this shift: No intake/output data recorded.  Gen:  Alert, NAD, pleasant Card:  Regular rate and rhythm, pedal pulses 2+ BL Pulm:  Normal effort, clear to auscultation bilaterally Abd: Soft, TTP left hemi-abdomen and suprapubic region without guarding or peritonitis. Drain - serosanguinous drainage  Skin: warm and dry, no rashes  Psych: A&Ox3   Lab Results:  Recent Labs    10/25/21 0308 10/27/21 0637  WBC 13.6* 9.4  HGB 13.2 11.9*  HCT 41.1 37.4*  PLT 393 350   BMET Recent Labs    10/24/21 2130 10/25/21 0308  NA 134* 136  K 3.9 3.8  CL 103 104  CO2 23 25  GLUCOSE 108* 104*  BUN 9 9  CREATININE 1.08 0.93  CALCIUM 9.0 8.6*   PT/INR Recent Labs    10/24/21 2130  LABPROT 15.3*  INR 1.2   ABG No results for input(s): PHART, HCO3 in the last 72 hours.  Invalid input(s): PCO2, PO2  Studies/Results: CT IMAGE GUIDED FLUID DRAIN BY CATHETER  Result Date: 10/26/2021 INDICATION: Anterior pelvic diverticular abscess EXAM: CT DRAINAGE ANTERIOR PELVIC DIVERTICULAR ABSCESS MEDICATIONS: The patient is currently admitted to the hospital and receiving intravenous antibiotics. The antibiotics were administered within an appropriate time frame prior to the initiation of the procedure. ANESTHESIA/SEDATION: Moderate  (conscious) sedation was employed during this procedure. A total of Versed 4.0 mg and Fentanyl 200 mcg was administered intravenously by the radiology nurse. Total intra-service moderate Sedation Time: 20 minutes. The patient's level of consciousness and vital signs were monitored continuously by radiology nursing throughout the procedure under my direct supervision. COMPLICATIONS: None immediate. PROCEDURE: Informed written consent was obtained from the patient after a thorough discussion of the procedural risks, benefits and alternatives. All questions were addressed. Maximal Sterile Barrier Technique was utilized including caps, mask, sterile gowns, sterile gloves, sterile drape, hand hygiene and skin antiseptic. A timeout was performed prior to the initiation of the procedure. previous imaging reviewed. patient positioned supine. noncontrast localization ct performed. the intrapelvic abscess between the sigmoid colon and bladder dome was localized and marked for a midline approach. Under sterile conditions and local anesthesia, an 18 gauge 15 cm access needle was advanced from a anterior paramidline approach into the abscess. Needle position confirmed with CT. Syringe aspiration yielded purulent fluid. Guidewire inserted followed by tract dilatation to insert a 10 [place] drain. Drain catheter position confirmed with CT. Retention loop formed. Syringe aspiration yielded 30 cc purulent fluid. Sample sent for culture. Catheter secured with Prolene suture and a sterile dressing. External suction bulb applied. No immediate complication. Patient tolerated the procedure well. IMPRESSION: Successful CT-guided anterior pelvic diverticular abscess drain placement Electronically Signed   By: Jerilynn Mages.  Shick M.D.   On: 10/26/2021 15:35    Anti-infectives: Anti-infectives (From admission, onward)    Start     Dose/Rate Route Frequency Ordered Stop  10/24/21 2200  piperacillin-tazobactam (ZOSYN) IVPB 3.375 g        3.375  g 12.5 mL/hr over 240 Minutes Intravenous Every 8 hours 10/24/21 2110         Assessment/Plan: Complicated sigmoid diverticulitis with abscess  - WBC normal from 18.9 - IR drain in pelvic abscess - placed 10/26/21 - no emergent need for surgery. Continue IV antibiotics and bowel rest.  - No CT evidence of CV fistula at this time, no pneumaturia.  - hopefully he will improve with non-operative management and can be seen in the outpatient setting to plan elective partial colectomy. Failure to improve would likely result in ex lap, partial colectomy, end colostomy.     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 [name] HTN - home lopressor  Obesity  PMH gout  Anxiety - home effexor/zoloft   LOS: 3 days    Maia Petties 10/27/2021  MDM Low

## 2021-10-27 NOTE — Progress Notes (Signed)
Referring Physician(s): CCS  Supervising Physician: Daryll Brod  Patient Status:  [facility] - In-pt  Chief Complaint:  Intraabd fluid collection, s/s aspiration and drain placement by IR on 10/26/21   Subjective:  Patient laying in bed, not in acute distress. Reports persistent abdominal pain and nausea but no vomiting. Has no complaints regarding the drain.  Allergies: Patient has no known allergies.  Medications: Prior to Admission medications   Medication Sig Start Date End Date Taking? Authorizing Provider  ALPRAZolam Duanne Moron) 0.5 MG tablet Take 1 tablet (0.5 mg total) by mouth once daily as needed Patient taking differently: Take 0.5 mg by mouth daily as needed for anxiety. 07/29/21  Yes   amlodipine-olmesartan (AZOR) 10-20 MG tablet Take 1 tablet by mouth once daily 07/29/21  Yes   CALCIUM CITRATE PO Take 600 mg by mouth 3 (three) times daily.   Yes [provider]  indapamide (LOZOL) 2.5 MG tablet Take 1 tablet (2.5 mg total) by mouth once daily 07/29/21  Yes   linaclotide (LINZESS) 72 MCG capsule Take 1 capsule (72 mcg total) by mouth once daily 09/30/21  Yes   metoCLOPramide (REGLAN) 5 MG tablet Take 1 tablet (5 mg total) by mouth at bedtime for 90 days 08/16/21  Yes   Multiple Vitamins-Minerals (MULTIVITAMIN ADULT, MINERALS, PO) Take 1 tablet by mouth daily. Bariatric with iron   Yes [provider]  pantoprazole (PROTONIX) 40 MG tablet TAKE 1 TABLET BY MOUTH 2 (TWO) TIMES DAILY Patient taking differently: Take 40 mg by mouth 2 (two) times daily as needed (for acid reflux). 07/27/20 10/24/22 Yes Rusty Aus, MD  sucralfate (CARAFATE) 1 g tablet TAKE 1 TABLET BY MOUTH 4 (FOUR) TIMES DAILY BEFORE MEALS AND NIGHTLY Patient taking differently: Take 1 g by mouth 2 (two) times daily as needed (for acid reflux). 07/27/20 10/24/22 Yes Rusty Aus, MD  venlafaxine XR (EFFEXOR-XR) 75 MG 24 hr capsule Take 1 capsule (75 mg total) by mouth once daily 08/16/21  Yes    colchicine 0.6 MG tablet Take 1 tablet (0.6 mg total) by mouth 2 (two) times daily as needed (gout) Patient not taking: Reported on 10/24/2021 07/29/21     metoprolol tartrate (LOPRESSOR) 25 MG tablet TAKE 1 TABLET BY MOUTH 2 (TWO) TIMES DAILY. Patient not taking: Reported on 10/24/2021 12/18/20 12/18/21  Greer Pickerel, MD  phentermine 37.5 MG capsule Take 1 capsule (37.5 mg total) by mouth every morning before breakfast for 30 days Patient not taking: Reported on 10/24/2021 10/02/21     sertraline (ZOLOFT) 50 MG tablet Take 50 mg by mouth daily. Patient not taking: Reported on 10/24/2021 07/27/20   [provider]  venlafaxine XR (EFFEXOR-XR) 37.5 MG 24 hr capsule Take 1 capsule (37.5 mg total) by mouth once daily Patient not taking: Reported on 10/24/2021 07/29/21        Vital Signs: BP (!) 141/89 (BP Location: Right Arm)    Pulse 72    Temp 99.3 F (37.4 C) (Oral)    Resp 20    Ht 5\' 8"  (1.727 m)    Wt (!) 310 lb (140.6 kg)    SpO2 91%    BMI 47.14 kg/m   Physical Exam Vitals reviewed.  Constitutional:      General: He is not in acute distress.    Appearance: Normal appearance. He is not ill-appearing.  HENT:     Head: Normocephalic and atraumatic.  Pulmonary:     Effort: Pulmonary effort is normal.  Abdominal:  Palpations: Abdomen is soft.  Skin:    General: Skin is warm and dry.     Coloration: Skin is not jaundiced or pale.     Comments: Positive Mid lower abdominal drain to a suction bulb. Site is unremarkable with no erythema, edema, tenderness, bleeding or drainage. Suture and stat lock in place. Dressing is clean, dry, and intact. 10 ml of  purulent, dark pink fluid noted in the bulb. Drain aspirates and flushes well.    Neurological:     Mental Status: He is alert and oriented to person, place, and time.  Psychiatric:        Mood and Affect: Mood normal.        Behavior: Behavior normal.    Imaging: CT ABDOMEN PELVIS W CONTRAST  Result Date: 10/23/2021 CLINICAL  DATA:  Fever. Lack of bowel movements. History of umbilical hernia repair. EXAM: CT ABDOMEN AND PELVIS WITH CONTRAST TECHNIQUE: Multidetector CT imaging of the abdomen and pelvis was performed using the standard protocol following bolus administration of intravenous contrast. CONTRAST:  158mL OMNIPAQUE IOHEXOL 300 MG/ML  SOLN COMPARISON:  10/01/2021 FINDINGS: Lower chest: Clear lung bases. Normal heart size without pericardial or pleural effusion. 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 Concurrent tiny hiatal hernia with contrast in the distal esophagus. Hepatobiliary: Poor contrast bolus, limiting evaluation of previously described indeterminate liver lesions. Example at 2.0 cm in the right hepatic lobe on 18/2. Cholecystectomy, without biliary ductal dilatation. Pancreas: Normal, without mass or ductal dilatation. Spleen: Normal in size, without focal abnormality. Adrenals/Urinary Tract: Normal adrenal glands. Bilateral punctate renal collecting system calculi. Lower pole right renal exophytic lesion measures slightly greater than fluid density and is new compared to 2015. No air within the urinary bladder. Pericystic edema is increased including on 78/2. 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 Wall thickening of and edema surrounding the sigmoid is increased. Development of a complex fluid collection positioned between the sigmoid and left side of the urinary bladder dome. Example 4.8 x 4.2 cm on 74/2. Minimal gas within. Favor fistulous communication between the proximal and distal sigmoid including on 71/2. The colon is stool-filled proximal to this site, suggesting upstream constipation and possible low-grade obstruction. Normal terminal ileum and appendix. Normal small bowel. Vascular/Lymphatic: Normal caliber of the aorta and branch vessels. No abdominopelvic adenopathy. Reproductive: Normal prostate. Other: No significant free  fluid. No free intraperitoneal air. Tiny fat containing ventral abdominal wall hernia. Musculoskeletal: No acute osseous abnormality. IMPRESSION: 1. Progressive, now complicated sigmoid diverticulitis, as evidenced by developing abscess caudal to the sigmoid and cephalad to the urinary bladder. Surrounding pelvic and pericystic edema, without well-defined colonic to bladder fistula. This cannot however, be entirely excluded. 2. 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 3. Upstream stool volume suggests constipation or low-grade obstruction at the level of the sigmoid. 4. Bilateral nephrolithiasis. 5. 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 Esophageal air fluid level suggests dysmotility or gastroesophageal reflux. These results will be called to the ordering clinician or representative by the Radiology Department at the imaging location. Electronically Signed   By: Abigail Miyamoto M.D.   On: 10/23/2021 20:44   DG Chest Port 1 View  Result Date: 10/24/2021 CLINICAL DATA:  Sepsis EXAM: PORTABLE CHEST 1 VIEW COMPARISON:  09/04/2020 FINDINGS: The heart size and mediastinal contours are within normal limits. Both lungs are clear. The visualized skeletal structures are unremarkable. IMPRESSION: No active disease. Electronically Signed   By: Linwood Dibbles.D.  On: 10/24/2021 21:21   CT IMAGE GUIDED FLUID DRAIN BY CATHETER  Result Date: 10/26/2021 INDICATION: Anterior pelvic diverticular abscess EXAM: CT DRAINAGE ANTERIOR PELVIC DIVERTICULAR ABSCESS MEDICATIONS: The patient is currently admitted to the hospital and receiving intravenous antibiotics. The antibiotics were administered within an appropriate time frame prior to the initiation of the procedure. ANESTHESIA/SEDATION: Moderate (conscious) sedation was employed during this procedure. A total of Versed 4.0 mg and Fentanyl 200 mcg was administered intravenously by the radiology nurse. Total intra-service moderate Sedation Time: 20  minutes. The patient's level of consciousness and vital signs were monitored continuously by radiology nursing throughout the procedure under my direct supervision. COMPLICATIONS: None immediate. PROCEDURE: Informed written consent was obtained from the patient after a thorough discussion of the procedural risks, benefits and alternatives. All questions were addressed. Maximal Sterile Barrier Technique was utilized including caps, mask, sterile gowns, sterile gloves, sterile drape, hand hygiene and skin antiseptic. A timeout was performed prior to the initiation of the procedure. previous imaging reviewed. patient positioned supine. noncontrast localization ct performed. the intrapelvic abscess between the sigmoid colon and bladder dome was localized and marked for a midline approach. Under sterile conditions and local anesthesia, an 18 gauge 15 cm access needle was advanced from a anterior paramidline approach into the abscess. Needle position confirmed with CT. Syringe aspiration yielded purulent fluid. Guidewire inserted followed by tract dilatation to insert a 10 [place] drain. Drain catheter position confirmed with CT. Retention loop formed. Syringe aspiration yielded 30 cc purulent fluid. Sample sent for culture. Catheter secured with Prolene suture and a sterile dressing. External suction bulb applied. No immediate complication. Patient tolerated the procedure well. IMPRESSION: Successful CT-guided anterior pelvic diverticular abscess drain placement Electronically Signed   By: Jerilynn Mages.  Shick M.D.   On: 10/26/2021 15:35    Labs:  CBC: Recent Labs    12/18/20 0431 10/24/21 2130 10/25/21 0308 10/27/21 0637  WBC 14.9* 18.9* 13.6* 9.4  HGB 13.0 14.4 13.2 11.9*  HCT 41.4 44.9 41.1 37.4*  PLT 344 454* 393 350    COAGS: Recent Labs    10/24/21 2130  INR 1.2  APTT 49*    BMP: Recent Labs    12/18/20 0431 10/01/21 1229 10/24/21 2130 10/25/21 0308 10/27/21 0637  NA 136  --  134* 136 136  K  3.9  --  3.9 3.8 3.9  CL 101  --  103 104 101  CO2 25  --  23 25 27   GLUCOSE 139*  --  108* 104* 100*  BUN 13  --  9 9 <5*  CALCIUM 9.0  --  9.0 8.6* 8.6*  CREATININE 1.07 1.20 1.08 0.93 0.87  GFRNONAA >60  --  >60 >60 >60    LIVER FUNCTION TESTS: Recent Labs    12/07/20 0954 12/18/20 0431 10/24/21 2130  BILITOT 1.0 1.0 1.4*  AST 25 265* 18  ALT 23 242* 19  ALKPHOS 59 60 78  PROT 8.3* 7.2 8.6*  ALBUMIN 4.3 3.6 3.8    Assessment and Plan:  41 y.o. male with intraabdominal fluid collection s/p aspiration and drain placement with IR on 1/7.  VSS WBC w.in normal range, mild drop In hgb 11.9 (13.2 yesterday)  Cx pending  Drain Location: mid lower abd  Size: Fr size: 10 Fr Date of placement: 10/26/21  Currently to: Drain collection device: suction bulb 24 hour output:  Output by Drain (mL) 10/25/21 0701 - 10/25/21 1900 10/25/21 1901 - 10/26/21 0700 10/26/21 0701 - 10/26/21 1900 10/26/21  1901 - 10/27/21 0700 10/27/21 0701 - 10/27/21 0813  Closed System Drain 1 Anterior;Inferior;Midline Abdomen Bulb (JP) 10 Fr.   85 38     Interval imaging/drain manipulation:  None   Current examination: Flushes/aspirates easily.  Insertion site unremarkable. Suture and stat lock in place. Dressed appropriately.   Plan: Continue TID flushes with 5 cc NS. Record output Q shift. Dressing changes QD or PRN if soiled.  Call IR APP or on call IR MD if difficulty flushing or sudden change in drain output.  Repeat imaging/possible drain injection once output < 10 mL/QD (excluding flush material.)  Discharge planning: Please contact IR APP or on call IR MD prior to patient d/c to ensure appropriate follow up plans are in place. Typically patient will follow up with IR clinic 10-14 days post d/c for repeat imaging/possible drain injection. IR scheduler will contact patient with date/time of appointment. Patient will need to flush drain QD with 5 cc NS, record output QD, dressing changes every  2-3 days or earlier if soiled.   IR will continue to follow - please call with questions or concerns.    Electronically Signed: Tera Mater, PA-C 10/27/2021, 8:10 AM   I spent a total of 15 Minutes at the the patient's bedside AND on the patient's hospital floor or unit, greater than 50% of which was counseling/coordinating care for intraabd drain.   This chart was dictated using voice recognition software.  Despite best efforts to proofread,  errors can occur which can change the documentation meaning.

## 2021-10-28 MED ORDER — MENTHOL 3 MG MT LOZG: 1.0000 | LOZENGE | OROMUCOSAL | Status: DC | PRN

## 2021-10-28 MED ORDER — POLYETHYLENE GLYCOL 3350 17 G PO PACK
17.0000 g | PACK | Freq: Every day | ORAL | Status: DC
  Administered 2021-10-28 – 2021-10-30 (×3): 17 g via ORAL
  Filled 2021-10-28 (×3): qty 1

## 2021-10-28 MED ORDER — DIPHENHYDRAMINE HCL 50 MG/ML IJ SOLN: 12.5000 mg | Freq: Four times a day (QID) | INTRAMUSCULAR | Status: DC | PRN

## 2021-10-28 MED ORDER — METHOCARBAMOL 1000 MG/10ML IJ SOLN
1000.0000 mg | Freq: Four times a day (QID) | INTRAVENOUS | Status: DC | PRN
  Filled 2021-10-28: qty 10

## 2021-10-28 MED ORDER — PHENOL 1.4 % MT LIQD: 2.0000 | OROMUCOSAL | Status: DC | PRN

## 2021-10-28 MED ORDER — ADULT MULTIVITAMIN W/MINERALS CH
1.0000 | ORAL_TABLET | Freq: Every day | ORAL | Status: DC
  Administered 2021-10-28 – 2021-10-30 (×3): 1 via ORAL
  Filled 2021-10-28 (×3): qty 1

## 2021-10-28 MED ORDER — METOCLOPRAMIDE HCL 5 MG/ML IJ SOLN
5.0000 mg | Freq: Three times a day (TID) | INTRAMUSCULAR | Status: DC | PRN
  Administered 2021-10-28 – 2021-10-29 (×2): 10 mg via INTRAVENOUS
  Filled 2021-10-28 (×2): qty 2

## 2021-10-28 MED ORDER — PROCHLORPERAZINE EDISYLATE 10 MG/2ML IJ SOLN: 5.0000 mg | INTRAMUSCULAR | Status: DC | PRN

## 2021-10-28 MED ORDER — ENSURE MAX PROTEIN PO LIQD
11.0000 [oz_av] | Freq: Every day | ORAL | Status: DC
  Administered 2021-10-28 – 2021-10-29 (×2): 11 [oz_av] via ORAL
  Filled 2021-10-28 (×4): qty 330

## 2021-10-28 MED ORDER — MAGIC MOUTHWASH
15.0000 mL | Freq: Four times a day (QID) | ORAL | Status: DC | PRN
  Filled 2021-10-28: qty 15

## 2021-10-28 MED ORDER — LIP MEDEX EX OINT
1.0000 "application " | TOPICAL_OINTMENT | Freq: Two times a day (BID) | CUTANEOUS | Status: DC
  Administered 2021-10-28 – 2021-10-30 (×4): 1 via TOPICAL
  Filled 2021-10-28 (×2): qty 7

## 2021-10-28 MED ORDER — ACETAMINOPHEN 500 MG PO TABS
1000.0000 mg | ORAL_TABLET | Freq: Four times a day (QID) | ORAL | Status: DC
  Administered 2021-10-28 – 2021-10-30 (×5): 1000 mg via ORAL
  Filled 2021-10-28 (×5): qty 2

## 2021-10-28 MED ORDER — ALUM & MAG HYDROXIDE-SIMETH 200-200-20 MG/5ML PO SUSP
30.0000 mL | Freq: Four times a day (QID) | ORAL | Status: DC | PRN
  Administered 2021-10-29: 30 mL via ORAL
  Filled 2021-10-28: qty 30

## 2021-10-28 MED ORDER — OXYCODONE HCL 5 MG PO TABS
5.0000 mg | ORAL_TABLET | ORAL | Status: DC | PRN
  Administered 2021-10-29: 10 mg via ORAL
  Filled 2021-10-28: qty 2

## 2021-10-28 NOTE — Progress Notes (Addendum)
Progress Note     Subjective: Nausea has worsened today. Still no BM and not passing flatus. Still having left lower abdominal pain. Suprapubic pain and dysuria improved  Objective: Vital signs in last 24 hours: Temp:  [98.3 F (36.8 C)-98.8 F (37.1 C)] 98.3 F (36.8 C) (01/09 0551) Pulse Rate:  [62-75] 75 (01/09 0819) Resp:  [14-18] 14 (01/09 0551) BP: (114-147)/(61-84) 147/84 (01/09 0819) SpO2:  [94 %-96 %] 95 % (01/09 0551) Last BM Date: 09/11/22  Intake/Output from previous day: 01/08 0701 - 01/09 0700 In: 3015.2 [P.O.:360; I.V.:2515.4; IV Piggyback:129.8] Out: 705 [Urine:690; Drains:15] Intake/Output this shift: Total I/O In: -  Out: 5 [Drains:5]  PE: General: pleasant, WD, male who is laying in bed in NAD HEENT: head is normocephalic, atraumatic. Mouth is pink and moist Heart: regular, rate, and rhythm.  Palpable radial pulses bilaterally Lungs: CTAB, no wheezes, rhonchi, or rales noted.  Respiratory effort nonlabored Abd: soft, +BS, mild TTP LLQ and around drain without nausea or emesis. Drain with scant serosanguinous output MSK: all 4 extremities are symmetrical with no cyanosis, clubbing, or edema. No calf TTP bilaterally Skin: warm and dry Psych: A&Ox3 with an appropriate affect.    Lab Results:  Recent Labs    10/27/21 0637  WBC 9.4  HGB 11.9*  HCT 37.4*  PLT 350   BMET Recent Labs    10/27/21 0637  NA 136  K 3.9  CL 101  CO2 27  GLUCOSE 100*  BUN <5*  CREATININE 0.87  CALCIUM 8.6*   PT/INR No results for input(s): LABPROT, INR in the last 72 hours. CMP     Component Value Date/Time   NA 136 10/27/2021 0637   NA 138 05/02/2014 0711   K 3.9 10/27/2021 0637   K 3.7 05/02/2014 0711   CL 101 10/27/2021 0637   CL 103 05/02/2014 0711   CO2 27 10/27/2021 0637   CO2 27 05/02/2014 0711   GLUCOSE 100 (H) 10/27/2021 0637   GLUCOSE 97 05/02/2014 0711   BUN <5 (L) 10/27/2021 0637   BUN 14 05/02/2014 0711   CREATININE 0.87 10/27/2021  0637   CREATININE 0.86 05/02/2014 0711   CALCIUM 8.6 (L) 10/27/2021 0637   CALCIUM 9.1 05/02/2014 0711   PROT 8.6 (H) 10/24/2021 2130   PROT 8.2 05/02/2014 0711   ALBUMIN 3.8 10/24/2021 2130   ALBUMIN 3.9 05/02/2014 0711   AST 18 10/24/2021 2130   AST 29 05/02/2014 0711   ALT 19 10/24/2021 2130   ALT 40 05/02/2014 0711   ALKPHOS 78 10/24/2021 2130   ALKPHOS 61 05/02/2014 0711   BILITOT 1.4 (H) 10/24/2021 2130   BILITOT 0.6 05/02/2014 0711   GFRNONAA >60 10/27/2021 0637   GFRNONAA >60 05/02/2014 0711   GFRAA >60 06/08/2019 1329   GFRAA >60 05/02/2014 0711   Lipase     Component Value Date/Time   LIPASE 96 12/09/2012 0747       Studies/Results: CT IMAGE GUIDED FLUID DRAIN BY CATHETER  Result Date: 10/26/2021 INDICATION: Anterior pelvic diverticular abscess EXAM: CT DRAINAGE ANTERIOR PELVIC DIVERTICULAR ABSCESS MEDICATIONS: The patient is currently admitted to the hospital and receiving intravenous antibiotics. The antibiotics were administered within an appropriate time frame prior to the initiation of the procedure. ANESTHESIA/SEDATION: Moderate (conscious) sedation was employed during this procedure. A total of Versed 4.0 mg and Fentanyl 200 mcg was administered intravenously by the radiology nurse. Total intra-service moderate Sedation Time: 20 minutes. The patient's level of consciousness and vital signs were  monitored continuously by radiology nursing throughout the procedure under my direct supervision. COMPLICATIONS: None immediate. PROCEDURE: Informed written consent was obtained from the patient after a thorough discussion of the procedural risks, benefits and alternatives. All questions were addressed. Maximal Sterile Barrier Technique was utilized including caps, mask, sterile gowns, sterile gloves, sterile drape, hand hygiene and skin antiseptic. A timeout was performed prior to the initiation of the procedure. previous imaging reviewed. patient positioned supine.  noncontrast localization ct performed. the intrapelvic abscess between the sigmoid colon and bladder dome was localized and marked for a midline approach. Under sterile conditions and local anesthesia, an 18 gauge 15 cm access needle was advanced from a anterior paramidline approach into the abscess. Needle position confirmed with CT. Syringe aspiration yielded purulent fluid. Guidewire inserted followed by tract dilatation to insert a 10 [place] drain. Drain catheter position confirmed with CT. Retention loop formed. Syringe aspiration yielded 30 cc purulent fluid. Sample sent for culture. Catheter secured with Prolene suture and a sterile dressing. External suction bulb applied. No immediate complication. Patient tolerated the procedure well. IMPRESSION: Successful CT-guided anterior pelvic diverticular abscess drain placement Electronically Signed   By: Jerilynn Mages.  Shick M.D.   On: 10/26/2021 15:35    Anti-infectives: Anti-infectives (From admission, onward)    Start     Dose/Rate Route Frequency Ordered Stop   10/24/21 2200  piperacillin-tazobactam (ZOSYN) IVPB 3.375 g        3.375 g 12.5 mL/hr over 240 Minutes Intravenous Every 8 hours 10/24/21 2110          Assessment/Plan  Complicated sigmoid diverticulitis with abscess  - WBC normal yesterday (1/8) from 18.9, afebrile - IR drain in pelvic abscess - placed 10/26/21 - drain output 15 mL over last 24h - no emergent need for surgery. Continue IV antibiotics and bowel rest.  - No CT evidence of CV fistula at this time, no pneumaturia.  - hopefully he will improve with non-operative management and can be seen in the outpatient setting to plan elective partial colectomy. Failure to improve would likely result in ex lap, partial colectomy, end colostomy.  - advance to FLD. Start PO pain meds. Add daily miralax  ID - drain culture with moderate strep - UCX with enterococcus sensitive to ampicillin, nitrofurantoin - blood cultures 1/5 no growth at 3  days     FEN: FLD/ensure max, IVF ID: zosyn 1/5>> VTE: SCD's, Lovenox Foley: none Dispo: admitted to inpatient med-surg, CCS service. Add PO pain regimen. Continue antiemetics. 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 [name] HTN - home lopressor  Obesity  PMH gout  Anxiety - home effexor/zoloft    Low Medical Decision Making   LOS: 4 days   Winferd Humphrey, [facility] Surgery 10/28/2021, 9:27 AM Please see Amion for pager number during day hours 7:00am-4:30pm

## 2021-10-28 NOTE — Progress Notes (Signed)
Referring Physician(s): Rolm Bookbinder  Supervising Physician: Juliet Rude  Patient Status:  [facility] - In-pt  Chief Complaint:  F/u Drain  Brief History:  Nathaniel Curry is a 41 y.o. 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   He presented to the ED at [facility] with fevers and consitipation.   CT showed complicated sigmoid diverticulitis, as evidenced by developing abscess caudal to the sigmoid and cephalad to the urinary bladder. Surrounding pelvic and pericystic edema, without well-defined colonic to bladder fistula.   He underwent image guided drain placement of 10/25/21 by Dr. Annamaria Boots  Subjective:  Doing well. States RN flushed drain already today. No issues.  Allergies: Patient has no known allergies.  Medications: Prior to Admission medications   Medication Sig Start Date End Date Taking? Authorizing Provider  ALPRAZolam Duanne Moron) 0.5 MG tablet Take 1 tablet (0.5 mg total) by mouth once daily as needed Patient taking differently: Take 0.5 mg by mouth daily as needed for anxiety. 07/29/21  Yes   amlodipine-olmesartan (AZOR) 10-20 MG tablet Take 1 tablet by mouth once daily 07/29/21  Yes   CALCIUM CITRATE PO Take 600 mg by mouth 3 (three) times daily.   Yes [provider]  indapamide (LOZOL) 2.5 MG tablet Take 1 tablet (2.5 mg total) by mouth once daily 07/29/21  Yes   linaclotide (LINZESS) 72 MCG capsule Take 1 capsule (72 mcg total) by mouth once daily 09/30/21  Yes   metoCLOPramide (REGLAN) 5 MG tablet Take 1 tablet (5 mg total) by mouth at bedtime for 90 days 08/16/21  Yes   Multiple Vitamins-Minerals (MULTIVITAMIN ADULT, MINERALS, PO) Take 1 tablet by mouth daily. Bariatric with iron   Yes [provider]  pantoprazole (PROTONIX) 40 MG tablet TAKE 1 TABLET BY MOUTH 2 (TWO) TIMES DAILY Patient taking differently: Take 40 mg by mouth 2 (two) times daily as needed (for acid reflux). 07/27/20 10/24/22 Yes Rusty Aus, MD   sucralfate (CARAFATE) 1 g tablet TAKE 1 TABLET BY MOUTH 4 (FOUR) TIMES DAILY BEFORE MEALS AND NIGHTLY Patient taking differently: Take 1 g by mouth 2 (two) times daily as needed (for acid reflux). 07/27/20 10/24/22 Yes Rusty Aus, MD  venlafaxine XR (EFFEXOR-XR) 75 MG 24 hr capsule Take 1 capsule (75 mg total) by mouth once daily 08/16/21  Yes   colchicine 0.6 MG tablet Take 1 tablet (0.6 mg total) by mouth 2 (two) times daily as needed (gout) Patient not taking: Reported on 10/24/2021 07/29/21     metoprolol tartrate (LOPRESSOR) 25 MG tablet TAKE 1 TABLET BY MOUTH 2 (TWO) TIMES DAILY. Patient not taking: Reported on 10/24/2021 12/18/20 12/18/21  Greer Pickerel, MD  phentermine 37.5 MG capsule Take 1 capsule (37.5 mg total) by mouth every morning before breakfast for 30 days Patient not taking: Reported on 10/24/2021 10/02/21     sertraline (ZOLOFT) 50 MG tablet Take 50 mg by mouth daily. Patient not taking: Reported on 10/24/2021 07/27/20   [provider]  venlafaxine XR (EFFEXOR-XR) 37.5 MG 24 hr capsule Take 1 capsule (37.5 mg total) by mouth once daily Patient not taking: Reported on 10/24/2021 07/29/21        Vital Signs: BP 136/85 (BP Location: Left Arm)    Pulse 74    Temp 97.9 F (36.6 C) (Oral)    Resp 17    Ht 5\' 8"  (1.727 m)    Wt (!) 140.6 kg    SpO2 96%  BMI 47.14 kg/m   Physical Exam Constitutional:      Appearance: Normal appearance.  HENT:     Head: Normocephalic and atraumatic.  Cardiovascular:     Rate and Rhythm: Normal rate.  Pulmonary:     Effort: Pulmonary effort is normal. No respiratory distress.  Abdominal:     Palpations: Abdomen is soft.     Tenderness: There is no abdominal tenderness.  Skin:    General: Skin is warm and dry.  Neurological:     General: No focal deficit present.     Mental Status: He is alert and oriented to person, place, and time.  Psychiatric:        Mood and Affect: Mood normal.        Behavior: Behavior normal.        Thought  Content: Thought content normal.        Judgment: Judgment normal.  Drain Location: Anterior pelvis Size: Fr size: 10 Fr Date of placement: 10/26/2021  Currently to: Drain collection device: suction bulb 24 hour output:  Output by Drain (mL) 10/26/21 0701 - 10/26/21 1900 10/26/21 1901 - 10/27/21 0700 10/27/21 0701 - 10/27/21 1900 10/27/21 1901 - 10/28/21 0700 10/28/21 0701 - 10/28/21 1424  Closed System Drain 1 Anterior;Inferior;Midline Abdomen Bulb (JP) 10 Fr. 85 38  15 5   Current examination: Flushes/aspirates easily.  Insertion site unremarkable. Suture and stat lock in place. Dressed appropriately.    Imaging: DG Chest Port 1 View  Result Date: 10/24/2021 CLINICAL DATA:  Sepsis EXAM: PORTABLE CHEST 1 VIEW COMPARISON:  09/04/2020 FINDINGS: The heart size and mediastinal contours are within normal limits. Both lungs are clear. The visualized skeletal structures are unremarkable. IMPRESSION: No active disease. Electronically Signed   By: Fidela Salisbury M.D.   On: 10/24/2021 21:21   CT IMAGE GUIDED FLUID DRAIN BY CATHETER  Result Date: 10/26/2021 INDICATION: Anterior pelvic diverticular abscess EXAM: CT DRAINAGE ANTERIOR PELVIC DIVERTICULAR ABSCESS MEDICATIONS: The patient is currently admitted to the hospital and receiving intravenous antibiotics. The antibiotics were administered within an appropriate time frame prior to the initiation of the procedure. ANESTHESIA/SEDATION: Moderate (conscious) sedation was employed during this procedure. A total of Versed 4.0 mg and Fentanyl 200 mcg was administered intravenously by the radiology nurse. Total intra-service moderate Sedation Time: 20 minutes. The patient's level of consciousness and vital signs were monitored continuously by radiology nursing throughout the procedure under my direct supervision. COMPLICATIONS: None immediate. PROCEDURE: Informed written consent was obtained from the patient after a thorough discussion of the procedural risks,  benefits and alternatives. All questions were addressed. Maximal Sterile Barrier Technique was utilized including caps, mask, sterile gowns, sterile gloves, sterile drape, hand hygiene and skin antiseptic. A timeout was performed prior to the initiation of the procedure. previous imaging reviewed. patient positioned supine. noncontrast localization ct performed. the intrapelvic abscess between the sigmoid colon and bladder dome was localized and marked for a midline approach. Under sterile conditions and local anesthesia, an 18 gauge 15 cm access needle was advanced from a anterior paramidline approach into the abscess. Needle position confirmed with CT. Syringe aspiration yielded purulent fluid. Guidewire inserted followed by tract dilatation to insert a 10 [place] drain. Drain catheter position confirmed with CT. Retention loop formed. Syringe aspiration yielded 30 cc purulent fluid. Sample sent for culture. Catheter secured with Prolene suture and a sterile dressing. External suction bulb applied. No immediate complication. Patient tolerated the procedure well. IMPRESSION: Successful CT-guided anterior pelvic diverticular abscess drain placement Electronically  Signed   By: Jerilynn Mages.  Shick M.D.   On: 10/26/2021 15:35    Labs:  CBC: Recent Labs    12/18/20 0431 10/24/21 2130 10/25/21 0308 10/27/21 0637  WBC 14.9* 18.9* 13.6* 9.4  HGB 13.0 14.4 13.2 11.9*  HCT 41.4 44.9 41.1 37.4*  PLT 344 454* 393 350    COAGS: Recent Labs    10/24/21 2130  INR 1.2  APTT 49*    BMP: Recent Labs    12/18/20 0431 10/01/21 1229 10/24/21 2130 10/25/21 0308 10/27/21 0637  NA 136  --  134* 136 136  K 3.9  --  3.9 3.8 3.9  CL 101  --  103 104 101  CO2 25  --  23 25 27   GLUCOSE 139*  --  108* 104* 100*  BUN 13  --  9 9 <5*  CALCIUM 9.0  --  9.0 8.6* 8.6*  CREATININE 1.07 1.20 1.08 0.93 0.87  GFRNONAA >60  --  >60 >60 >60    LIVER FUNCTION TESTS: Recent Labs    12/07/20 0954 12/18/20 0431  10/24/21 2130  BILITOT 1.0 1.0 1.4*  AST 25 265* 18  ALT 23 242* 19  ALKPHOS 59 60 78  PROT 8.3* 7.2 8.6*  ALBUMIN 4.3 3.6 3.8    Assessment and Plan: Diverticular abscess - s/p drain placement by Dr. Annamaria Boots on 10/26/21.  Continue TID flushes with 5 cc NS. Record output Q shift. Dressing changes QD or PRN if soiled.  Call IR APP or on call IR MD if difficulty flushing or sudden change in drain output.  Repeat imaging/possible drain injection once output < 10 mL/QD (excluding flush material.)  Discharge planning: Please contact IR APP or on call IR MD prior to patient d/c to ensure appropriate follow up plans are in place. Typically patient will follow up with IR clinic 10-14 days post d/c for repeat imaging/possible drain injection. IR scheduler will contact patient with date/time of appointment. Patient will need to flush drain QD with 5 cc NS, record output QD, dressing changes every 2-3 days or earlier if soiled.   IR will continue to follow - please call with questions or concerns.  Electronically Signed: Murrell Redden, PA-C 10/28/2021, 2:24 PM    I spent a total of 15 Minutes at the the patient's bedside AND on the patient's hospital floor or unit, greater than 50% of which was counseling/coordinating care for f/u drain.

## 2021-10-28 NOTE — Progress Notes (Signed)
°  Transition of Care [facility]) Screening Note   Patient Details  Name: SAL SPRATLEY Date of Birth: [birth year]   Transition of Care [facility]) CM/SW Contact:    Claud Gowan, Marjie Skiff, RN Phone Number: 10/28/2021, 12:18 PM    Transition of Care Department [facility]) has reviewed patient and no TOC needs have been identified at this time. We will continue to monitor patient advancement through interdisciplinary progression rounds. If new patient transition needs arise, please place a TOC consult.

## 2021-10-28 NOTE — Consult Note (Signed)
° °  [facility] CM Inpatient Consult   10/28/2021  Nathaniel Curry 05-Dec-1980 [id number]   [facility] [ACO] Patient: Nathaniel Curry  Primary Care Provider:  Rusty Aus, MD, [facility]  Patient is to be assigned to a [place] Management for telephonic chronic disease management services for assistance with disease management and community resource support.         Curry: Patient will be assigned to a [place] closer to transition.   For additional questions or referrals please contact:   Natividad Brood, RN BSN [facility] Liaison  [phone] business mobile phone Toll free office [phone]  Fax number: [phone] [email or web address]

## 2021-10-29 ENCOUNTER — Inpatient Hospital Stay (HOSPITAL_COMMUNITY): Payer: 59

## 2021-10-29 ENCOUNTER — Encounter (HOSPITAL_COMMUNITY): Payer: Self-pay | Admitting: Radiology

## 2021-10-29 LAB — CBC
HCT: 45 % (ref 39.0–52.0)
Hemoglobin: 14.2 g/dL (ref 13.0–17.0)
MCH: 26.5 pg (ref 26.0–34.0)
MCHC: 31.6 g/dL (ref 30.0–36.0)
MCV: 84 fL (ref 80.0–100.0)
Platelets: 510 10*3/uL — ABNORMAL HIGH (ref 150–400)
RBC: 5.36 MIL/uL (ref 4.22–5.81)
RDW: 13.2 % (ref 11.5–15.5)
WBC: 10.8 10*3/uL — ABNORMAL HIGH (ref 4.0–10.5)
nRBC: 0 % (ref 0.0–0.2)

## 2021-10-29 LAB — BASIC METABOLIC PANEL
Anion gap: 15 (ref 5–15)
BUN: <5 mg/dL — ABNORMAL LOW (ref 6–20)
CO2: 22 mmol/L (ref 22–32)
Calcium: 8.9 mg/dL (ref 8.9–10.3)
Chloride: 103 mmol/L (ref 98–111)
Creatinine, Ser: 0.75 mg/dL (ref 0.61–1.24)
GFR, Estimated: >60 mL/min (ref >60)
Glucose, Bld: 106 mg/dL — ABNORMAL HIGH (ref 70–99)
Potassium: 3.4 mmol/L — ABNORMAL LOW (ref 3.5–5.1)
Sodium: 140 mmol/L (ref 135–145)

## 2021-10-29 LAB — AEROBIC/ANAEROBIC CULTURE W GRAM STAIN (SURGICAL/DEEP WOUND)

## 2021-10-29 LAB — MAGNESIUM: Magnesium: 1.8 mg/dL (ref 1.7–2.4)

## 2021-10-29 MED ORDER — METOCLOPRAMIDE HCL 5 MG/ML IJ SOLN
5.0000 mg | Freq: Three times a day (TID) | INTRAMUSCULAR | Status: DC
  Administered 2021-10-29 – 2021-10-30 (×3): 5 mg via INTRAVENOUS
  Filled 2021-10-29 (×3): qty 2

## 2021-10-29 MED ORDER — SODIUM CHLORIDE 0.9 % IV SOLN
3.0000 g | Freq: Four times a day (QID) | INTRAVENOUS | Status: DC
  Administered 2021-10-29 – 2021-10-30 (×4): 3 g via INTRAVENOUS
  Filled 2021-10-29 (×6): qty 8

## 2021-10-29 MED ORDER — BISACODYL 5 MG PO TBEC
5.0000 mg | DELAYED_RELEASE_TABLET | Freq: Once | ORAL | Status: AC
  Administered 2021-10-29: 5 mg via ORAL
  Filled 2021-10-29: qty 1

## 2021-10-29 MED ORDER — MAGNESIUM SULFATE IN D5W 1-5 GM/100ML-% IV SOLN
1.0000 g | Freq: Once | INTRAVENOUS | Status: AC
  Administered 2021-10-29: 1 g via INTRAVENOUS
  Filled 2021-10-29: qty 100

## 2021-10-29 MED ORDER — SENNOSIDES-DOCUSATE SODIUM 8.6-50 MG PO TABS
1.0000 | ORAL_TABLET | Freq: Every day | ORAL | Status: DC
  Administered 2021-10-29: 1 via ORAL
  Filled 2021-10-29: qty 1

## 2021-10-29 MED ORDER — POTASSIUM CHLORIDE 10 MEQ/100ML IV SOLN
10.0000 meq | INTRAVENOUS | Status: AC
  Administered 2021-10-29 (×5): 10 meq via INTRAVENOUS
  Filled 2021-10-29 (×5): qty 100

## 2021-10-29 MED ORDER — BISACODYL 10 MG RE SUPP
10.0000 mg | Freq: Once | RECTAL | Status: DC
Start: 2021-10-29 — End: 2021-10-29
  Filled 2021-10-29: qty 1

## 2021-10-29 NOTE — Progress Notes (Signed)
Progress Note     Subjective: Nausea worsened over the course of yesterday with some improvement with reglan. No emesis. Still no flatus or BM. Continues to have intermittent cramping abdominal pain but LLQ is improved. He only sipped on small amounts of liquids yesterday  Objective: Vital signs in last 24 hours: Temp:  [97.9 F (36.6 C)-98.3 F (36.8 C)] 98.2 F (36.8 C) (01/10 0512) Pulse Rate:  [74-82] 77 (01/10 0512) Resp:  [16-18] 16 (01/10 0512) BP: (136-149)/(78-85) 149/78 (01/10 0512) SpO2:  [95 %-96 %] 95 % (01/10 0512) Last BM Date:  (" its been weeks" per pt)  Intake/Output from previous day: 01/09 0701 - 01/10 0700 In: 684.1 [I.V.:629.7; IV Piggyback:54.4] Out: 25 [Drains:25] Intake/Output this shift: No intake/output data recorded.  PE: General: pleasant, WD, male who is laying in bed in NAD HEENT: head is normocephalic, atraumatic. Mouth is pink and moist Heart: regular, rate, and rhythm.  Palpable radial pulses bilaterally Lungs: CTAB, no wheezes, rhonchi, or rales noted.  Respiratory effort nonlabored Abd: soft, +BS, mild TTP LLQ and around drain rebound or guarding. Drain with scant serosanguinous output MSK: all 4 extremities are symmetrical with no cyanosis, clubbing, or edema. No calf TTP bilaterally Skin: warm and dry Psych: A&Ox3 with an appropriate affect.    Lab Results:  Recent Labs    10/27/21 0637  WBC 9.4  HGB 11.9*  HCT 37.4*  PLT 350    BMET Recent Labs    10/27/21 0637  NA 136  K 3.9  CL 101  CO2 27  GLUCOSE 100*  BUN <5*  CREATININE 0.87  CALCIUM 8.6*    PT/INR No results for input(s): LABPROT, INR in the last 72 hours. CMP     Component Value Date/Time   NA 136 10/27/2021 0637   NA 138 05/02/2014 0711   K 3.9 10/27/2021 0637   K 3.7 05/02/2014 0711   CL 101 10/27/2021 0637   CL 103 05/02/2014 0711   CO2 27 10/27/2021 0637   CO2 27 05/02/2014 0711   GLUCOSE 100 (H) 10/27/2021 0637   GLUCOSE 97 05/02/2014  0711   BUN <5 (L) 10/27/2021 0637   BUN 14 05/02/2014 0711   CREATININE 0.87 10/27/2021 0637   CREATININE 0.86 05/02/2014 0711   CALCIUM 8.6 (L) 10/27/2021 0637   CALCIUM 9.1 05/02/2014 0711   PROT 8.6 (H) 10/24/2021 2130   PROT 8.2 05/02/2014 0711   ALBUMIN 3.8 10/24/2021 2130   ALBUMIN 3.9 05/02/2014 0711   AST 18 10/24/2021 2130   AST 29 05/02/2014 0711   ALT 19 10/24/2021 2130   ALT 40 05/02/2014 0711   ALKPHOS 78 10/24/2021 2130   ALKPHOS 61 05/02/2014 0711   BILITOT 1.4 (H) 10/24/2021 2130   BILITOT 0.6 05/02/2014 0711   GFRNONAA >60 10/27/2021 0637   GFRNONAA >60 05/02/2014 0711   GFRAA >60 06/08/2019 1329   GFRAA >60 05/02/2014 0711   Lipase     Component Value Date/Time   LIPASE 96 12/09/2012 0747       Studies/Results: No results found.  Anti-infectives: Anti-infectives (From admission, onward)    Start     Dose/Rate Route Frequency Ordered Stop   10/24/21 2200  piperacillin-tazobactam (ZOSYN) IVPB 3.375 g        3.375 g 12.5 mL/hr over 240 Minutes Intravenous Every 8 hours 10/24/21 2110          Assessment/Plan  Complicated sigmoid diverticulitis with abscess  - WBC normal 1/8 from 18.9, afebrile -  IR drain in pelvic abscess - placed 10/26/21 - drain output 25 mL over last 24h. Continue drain on dc and follow up with IR 10-14 days. Flush drain QD with 5 cc NS, record output QD, dressing changes every 2-3 days  - No CT evidence of CV fistula at this time, no pneumaturia.  - hopefully he will improve with non-operative management and can be seen in the outpatient setting to plan elective partial colectomy. Failure to improve would likely result in ex lap, partial colectomy, end colostomy.   Constipation/ileus - abd xray this am - check electrolytes - continue daily miralax. Add senokot. Will consider scheduled reglan pending above labs/imaging - encouraged ambulation in hallway today  ID - drain culture with moderate strep group f - UCX with  enterococcus sensitive to ampicillin, nitrofurantoin - blood cultures 1/5 no growth at 3 days - narrow to unasyn today     FEN: FLD/ensure max, IVF ID: zosyn 1/5>1/10, unasyn 1/10 VTE: SCD's, Lovenox Foley: none Dispo: admitted to inpatient med-surg, CCS service. Add PO pain regimen. 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 [name] Severe obesity - BMI 47.14 kg/m  HTN - home lopressor  PMH gout  Anxiety - home effexor/zoloft    Moderate Medical Decision Making - ordering and reviewing labs and xray   LOS: 5 days   Winferd Humphrey, [facility] Surgery 10/29/2021, 8:05 AM Please see Amion for pager number during day hours 7:00am-4:30pm

## 2021-10-29 NOTE — Progress Notes (Deleted)
°  Progress Note   Date: 10/28/2021  Patient Name: Nathaniel Curry        MRN#: [id number]   Clarification of diagnosis:   severe obesity

## 2021-10-29 NOTE — Progress Notes (Signed)
Referring Physician(s): Jeanmarie Hubert  Supervising Physician: Markus Daft  Patient Status:  [facility] - In-pt  Chief Complaint: Nausea, constipation, diverticular abscess   Subjective: Pt cont to have nausea, constipation; denies worsening abd pain or vomiting   Allergies: Patient has no known allergies.  Medications: Prior to Admission medications   Medication Sig Start Date End Date Taking? Authorizing Provider  ALPRAZolam Duanne Moron) 0.5 MG tablet Take 1 tablet (0.5 mg total) by mouth once daily as needed Patient taking differently: Take 0.5 mg by mouth daily as needed for anxiety. 07/29/21  Yes   amlodipine-olmesartan (AZOR) 10-20 MG tablet Take 1 tablet by mouth once daily 07/29/21  Yes   CALCIUM CITRATE PO Take 600 mg by mouth 3 (three) times daily.   Yes [provider]  indapamide (LOZOL) 2.5 MG tablet Take 1 tablet (2.5 mg total) by mouth once daily 07/29/21  Yes   linaclotide (LINZESS) 72 MCG capsule Take 1 capsule (72 mcg total) by mouth once daily 09/30/21  Yes   metoCLOPramide (REGLAN) 5 MG tablet Take 1 tablet (5 mg total) by mouth at bedtime for 90 days 08/16/21  Yes   Multiple Vitamins-Minerals (MULTIVITAMIN ADULT, MINERALS, PO) Take 1 tablet by mouth daily. Bariatric with iron   Yes [provider]  pantoprazole (PROTONIX) 40 MG tablet TAKE 1 TABLET BY MOUTH 2 (TWO) TIMES DAILY Patient taking differently: Take 40 mg by mouth 2 (two) times daily as needed (for acid reflux). 07/27/20 10/24/22 Yes Rusty Aus, MD  sucralfate (CARAFATE) 1 g tablet TAKE 1 TABLET BY MOUTH 4 (FOUR) TIMES DAILY BEFORE MEALS AND NIGHTLY Patient taking differently: Take 1 g by mouth 2 (two) times daily as needed (for acid reflux). 07/27/20 10/24/22 Yes Rusty Aus, MD  venlafaxine XR (EFFEXOR-XR) 75 MG 24 hr capsule Take 1 capsule (75 mg total) by mouth once daily 08/16/21  Yes   colchicine 0.6 MG tablet Take 1 tablet (0.6 mg total) by mouth 2 (two) times daily as needed  (gout) Patient not taking: Reported on 10/24/2021 07/29/21     metoprolol tartrate (LOPRESSOR) 25 MG tablet TAKE 1 TABLET BY MOUTH 2 (TWO) TIMES DAILY. Patient not taking: Reported on 10/24/2021 12/18/20 12/18/21  Greer Pickerel, MD  phentermine 37.5 MG capsule Take 1 capsule (37.5 mg total) by mouth every morning before breakfast for 30 days Patient not taking: Reported on 10/24/2021 10/02/21     sertraline (ZOLOFT) 50 MG tablet Take 50 mg by mouth daily. Patient not taking: Reported on 10/24/2021 07/27/20   [provider]  venlafaxine XR (EFFEXOR-XR) 37.5 MG 24 hr capsule Take 1 capsule (37.5 mg total) by mouth once daily Patient not taking: Reported on 10/24/2021 07/29/21        Vital Signs: BP (!) 149/78 (BP Location: Left Arm)    Pulse 77    Temp 98.2 F (36.8 C) (Oral)    Resp 16    Ht 5\' 8"  (1.727 m)    Wt (!) 310 lb (140.6 kg)    SpO2 95%    BMI 47.14 kg/m   Physical Exam awake/alert; ant lower abd drain intact, dressing clean, OP 25 cc blood-tinged fluid; drain flushed without difficulty  Imaging: CT IMAGE GUIDED FLUID DRAIN BY CATHETER  Result Date: 10/26/2021 INDICATION: Anterior pelvic diverticular abscess EXAM: CT DRAINAGE ANTERIOR PELVIC DIVERTICULAR ABSCESS MEDICATIONS: The patient is currently admitted to the hospital and receiving intravenous antibiotics. The antibiotics were administered within an appropriate time frame prior to the initiation of  the procedure. ANESTHESIA/SEDATION: Moderate (conscious) sedation was employed during this procedure. A total of Versed 4.0 mg and Fentanyl 200 mcg was administered intravenously by the radiology nurse. Total intra-service moderate Sedation Time: 20 minutes. The patient's level of consciousness and vital signs were monitored continuously by radiology nursing throughout the procedure under my direct supervision. COMPLICATIONS: None immediate. PROCEDURE: Informed written consent was obtained from the patient after a thorough discussion of the  procedural risks, benefits and alternatives. All questions were addressed. Maximal Sterile Barrier Technique was utilized including caps, mask, sterile gowns, sterile gloves, sterile drape, hand hygiene and skin antiseptic. A timeout was performed prior to the initiation of the procedure. previous imaging reviewed. patient positioned supine. noncontrast localization ct performed. the intrapelvic abscess between the sigmoid colon and bladder dome was localized and marked for a midline approach. Under sterile conditions and local anesthesia, an 18 gauge 15 cm access needle was advanced from a anterior paramidline approach into the abscess. Needle position confirmed with CT. Syringe aspiration yielded purulent fluid. Guidewire inserted followed by tract dilatation to insert a 10 [place] drain. Drain catheter position confirmed with CT. Retention loop formed. Syringe aspiration yielded 30 cc purulent fluid. Sample sent for culture. Catheter secured with Prolene suture and a sterile dressing. External suction bulb applied. No immediate complication. Patient tolerated the procedure well. IMPRESSION: Successful CT-guided anterior pelvic diverticular abscess drain placement Electronically Signed   By: Jerilynn Mages.  Shick M.D.   On: 10/26/2021 15:35    Labs:  CBC: Recent Labs    12/18/20 0431 10/24/21 2130 10/25/21 0308 10/27/21 0637  WBC 14.9* 18.9* 13.6* 9.4  HGB 13.0 14.4 13.2 11.9*  HCT 41.4 44.9 41.1 37.4*  PLT 344 454* 393 350    COAGS: Recent Labs    10/24/21 2130  INR 1.2  APTT 49*    BMP: Recent Labs    12/18/20 0431 10/01/21 1229 10/24/21 2130 10/25/21 0308 10/27/21 0637  NA 136  --  134* 136 136  K 3.9  --  3.9 3.8 3.9  CL 101  --  103 104 101  CO2 25  --  23 25 27   GLUCOSE 139*  --  108* 104* 100*  BUN 13  --  9 9 <5*  CALCIUM 9.0  --  9.0 8.6* 8.6*  CREATININE 1.07 1.20 1.08 0.93 0.87  GFRNONAA >60  --  >60 >60 >60    LIVER FUNCTION TESTS: Recent Labs    12/07/20 0954  12/18/20 0431 10/24/21 2130  BILITOT 1.0 1.0 1.4*  AST 25 265* 18  ALT 23 242* 19  ALKPHOS 59 60 78  PROT 8.3* 7.2 8.6*  ALBUMIN 4.3 3.6 3.8    Assessment and Plan: Pt with hx diverticular abscess, s/p drain placement 1/7; afebrile; no new labs, drain fluid cx- group F strept; urine cx- enterococcus  Drain Location: ant lower abd Size: Fr size: 10 Fr Date of placement: 10/26/21  Currently to: Drain collection device: suction bulb 24 hour output:  Output by Drain (mL) 10/27/21 0701 - 10/27/21 1900 10/27/21 1901 - 10/28/21 0700 10/28/21 0701 - 10/28/21 1900 10/28/21 1901 - 10/29/21 0700 10/29/21 0701 - 10/29/21 1041  Closed System Drain 1 Anterior;Inferior;Midline Abdomen Bulb (JP) 10 Fr.  15 15 10        Current examination: Flushes/aspirates easily.  Insertion site unremarkable. Suture and stat lock in place. Dressed appropriately.   Plan: Continue TID flushes with 5 cc NS. Record output Q shift. Dressing changes QD or PRN if soiled.  Call IR APP or on call IR MD if difficulty flushing or sudden change in drain output.  Repeat imaging/possible drain injection once output < 10 mL/QD (excluding flush material.)  Discharge planning: Please contact IR APP or on call IR MD prior to patient d/c to ensure appropriate follow up plans are in place. Typically patient will follow up with IR clinic 10-14 days post d/c for repeat imaging/possible drain injection. IR scheduler will contact patient with date/time of appointment. Patient will need to flush drain QD with 5 cc NS, record output QD, dressing changes every 2-3 days or earlier if soiled.   IR will continue to follow - please call with questions or concerns.      Electronically Signed: D. Rowe Robert, PA-C 10/29/2021, 10:37 AM   I spent a total of 15 minutes at the the patient's bedside AND on the patient's hospital floor or unit, greater than 50% of which was counseling/coordinating care for abdominal abscess  drain    Patient ID: Nathaniel Curry, male   DOB: [birth year], 41 y.o.   MRN: [id number]

## 2021-10-29 NOTE — Progress Notes (Signed)
Patient ambulating in hall independently. Tolerated well.

## 2021-10-30 ENCOUNTER — Other Ambulatory Visit: Payer: Self-pay

## 2021-10-30 DIAGNOSIS — K5720 Diverticulitis of large intestine with perforation and abscess without bleeding: Secondary | ICD-10-CM

## 2021-10-30 DIAGNOSIS — I10 Essential (primary) hypertension: Secondary | ICD-10-CM

## 2021-10-30 LAB — BASIC METABOLIC PANEL
Anion gap: 7 (ref 5–15)
BUN: <5 mg/dL — ABNORMAL LOW (ref 6–20)
CO2: 24 mmol/L (ref 22–32)
Calcium: 8.6 mg/dL — ABNORMAL LOW (ref 8.9–10.3)
Chloride: 106 mmol/L (ref 98–111)
Creatinine, Ser: 0.75 mg/dL (ref 0.61–1.24)
GFR, Estimated: >60 mL/min (ref >60)
Glucose, Bld: 98 mg/dL (ref 70–99)
Potassium: 3.5 mmol/L (ref 3.5–5.1)
Sodium: 137 mmol/L (ref 135–145)

## 2021-10-30 LAB — CULTURE, BLOOD (ROUTINE X 2)
Culture: NO GROWTH
Culture: NO GROWTH
Special Requests: ADEQUATE
Special Requests: ADEQUATE

## 2021-10-30 MED ORDER — SODIUM CHLORIDE 0.9% FLUSH: 5.0000 mL | Freq: Every day | INTRAVENOUS | 1 refills | Status: DC

## 2021-10-30 MED ORDER — POLYETHYLENE GLYCOL 3350 17 G PO PACK: 17.0000 g | PACK | Freq: Every day | ORAL | 0 refills | Status: DC | PRN

## 2021-10-30 MED ORDER — TRAMADOL HCL 50 MG PO TABS
50.0000 mg | ORAL_TABLET | Freq: Four times a day (QID) | ORAL | 0 refills | Status: AC | PRN
Start: 2021-10-30 — End: 2021-11-04
  Filled 2021-10-30: qty 12, 3d supply, fill #0

## 2021-10-30 MED ORDER — ONDANSETRON 4 MG PO TBDP
4.0000 mg | ORAL_TABLET | Freq: Four times a day (QID) | ORAL | 0 refills | Status: AC | PRN
  Filled 2021-10-30: qty 12, 3d supply, fill #0

## 2021-10-30 MED ORDER — AMOXICILLIN-POT CLAVULANATE 875-125 MG PO TABS
1.0000 | ORAL_TABLET | Freq: Two times a day (BID) | ORAL | 0 refills | Status: AC
  Filled 2021-10-30: qty 13, 7d supply, fill #0

## 2021-10-30 MED ORDER — SENNOSIDES-DOCUSATE SODIUM 8.6-50 MG PO TABS: 1.0000 | ORAL_TABLET | Freq: Every evening | ORAL | Status: DC | PRN

## 2021-10-30 NOTE — Discharge Summary (Signed)
Physician Discharge Summary  Patient ID: Nathaniel Curry MRN: [id number] DOB/AGE: 1980-12-07 41 y.o.  Admit date: 10/24/2021 Discharge date: 10/30/2021  Admission Diagnoses:  Discharge Diagnoses:  Principal Problem:   Colonic diverticular abscess   Discharged Condition: good  Hospital Course: 41 yo male presented with abdominal pain and was found to have a diverticulitis with abscess. He was admitted and put on IV antibiotics. Interventional radiology was consulted for drain placement which was placed on 1/7. He slowly progressed after that having some nausea and pain as his diet was slowly advanced. His infection cleared and he was discharged home 1/11.  Consults: general surgery and IR  Significant Diagnostic Studies: CBC    Component Value Date/Time   WBC 10.8 (H) 10/29/2021 1058   RBC 5.36 10/29/2021 1058   HGB 14.2 10/29/2021 1058   HGB 15.7 05/02/2014 0711   HCT 45.0 10/29/2021 1058   HCT 48.3 05/02/2014 0711   PLT 510 (H) 10/29/2021 1058   PLT 339 05/02/2014 0711   MCV 84.0 10/29/2021 1058   MCV 82 05/02/2014 0711   MCH 26.5 10/29/2021 1058   MCHC 31.6 10/29/2021 1058   RDW 13.2 10/29/2021 1058   RDW 13.5 05/02/2014 0711   LYMPHSABS 2.3 10/24/2021 2130   LYMPHSABS 4.3 (H) 05/02/2014 0711   MONOABS 1.5 (H) 10/24/2021 2130   MONOABS 0.9 05/02/2014 0711   EOSABS 0.1 10/24/2021 2130   EOSABS 0.1 05/02/2014 0711   BASOSABS 0.1 10/24/2021 2130   BASOSABS 0.1 05/02/2014 0711     Treatments: IV fluids, Antibiotics, IR drain placement  Discharge Exam: Blood pressure (!) 150/87, pulse 77, temperature 98.3 F (36.8 C), temperature source Oral, resp. rate 18, height 5\' 8"  (1.727 m), weight (!) 140.6 kg, SpO2 97 %. General appearance: alert and cooperative Resp: clear to auscultation bilaterally GI: soft, non-tender; bowel sounds normal; no masses,  no organomegaly, IR drain with serous output  Disposition:   Discharge Instructions     Diet - low sodium heart healthy    Complete by: As directed    Discharge instructions   Complete by: As directed    -Do not submerge drain -Flush drain daily with 5-10 mL sterile saline.  -Dressing changes as needed when soiled/wet -Record amount of daily output. -You will see [place] in clinic in 7-10 days for follow up CT/possible drain injection. Schedulers will call with date and time of follow-up appointment.   Discharge wound care:   Complete by: As directed    As needed to keep clean and dry      Allergies as of 10/30/2021   No Known Allergies      Medication List     TAKE these medications    ALPRAZolam 0.5 MG tablet Commonly known as: XANAX Take 1 tablet (0.5 mg total) by mouth once daily as needed What changed:  how much to take how to take this when to take this reasons to take this   amlodipine-olmesartan 10-20 MG tablet Commonly known as: AZOR Take 1 tablet by mouth once daily   CALCIUM CITRATE PO Take 600 mg by mouth 3 (three) times daily.   colchicine 0.6 MG tablet Take 1 tablet (0.6 mg total) by mouth 2 (two) times daily as needed (gout)   indapamide 2.5 MG tablet Commonly known as: LOZOL Take 1 tablet (2.5 mg total) by mouth once daily   Linzess 72 MCG capsule Generic drug: linaclotide Take 1 capsule (72 mcg total) by mouth once daily   metoCLOPramide 5 MG  tablet Commonly known as: REGLAN Take 1 tablet (5 mg total) by mouth at bedtime for 90 days   metoprolol tartrate 25 MG tablet Commonly known as: LOPRESSOR TAKE 1 TABLET BY MOUTH 2 (TWO) TIMES DAILY.   MULTIVITAMIN ADULT (MINERALS) PO Take 1 tablet by mouth daily. Bariatric with iron   pantoprazole 40 MG tablet Commonly known as: PROTONIX TAKE 1 TABLET BY MOUTH 2 (TWO) TIMES DAILY What changed:  how much to take when to take this reasons to take this   phentermine 37.5 MG capsule Take 1 capsule (37.5 mg total) by mouth every morning before breakfast for 30 days   sertraline 50 MG tablet Commonly known as:  ZOLOFT Take 50 mg by mouth daily.   sucralfate 1 g tablet Commonly known as: CARAFATE TAKE 1 TABLET BY MOUTH 4 (FOUR) TIMES DAILY BEFORE MEALS AND NIGHTLY What changed:  how much to take how to take this when to take this reasons to take this   venlafaxine XR 37.5 MG 24 hr capsule Commonly known as: EFFEXOR-XR Take 1 capsule (37.5 mg total) by mouth once daily   venlafaxine XR 75 MG 24 hr capsule Commonly known as: EFFEXOR-XR Take 1 capsule (75 mg total) by mouth once daily               Discharge Care Instructions  (From admission, onward)           Start     Ordered   10/30/21 0000  Discharge wound care:       Comments: As needed to keep clean and dry   10/30/21 2542            Follow-up Information     Greggory Keen, MD Follow up.   Specialties: Interventional Radiology, Radiology Why: Schedulers will call with date and time of appointment expected in approximately 2 weeks. Contact information: [address] [place] [address] [phone]         Greer Pickerel, MD Follow up.   Specialty: General Surgery Contact information: [place] [address] [phone]                 Signed: Arta Bruce Latresa Gasser 10/30/2021, 11:21 AM

## 2021-10-30 NOTE — Progress Notes (Signed)
Nathaniel Curry to be D/C'd home per MD order. Discussed with the patient and all questions fully answered.  Skin clean, dry and intact without evidence of skin break down, no evidence of skin tears noted.  IV catheter discontinued intact. Site without signs and symptoms of complications. Dressing and pressure applied.  An After Visit Summary was printed and given to the patient. Extra flushes and dressing supplies provided for patient. Patient escorted off unit, and D/C home via private auto.  Nathaniel Curry  10/30/2021 12:44 PM

## 2021-10-31 ENCOUNTER — Other Ambulatory Visit: Payer: Self-pay | Admitting: *Deleted

## 2021-10-31 NOTE — Patient Outreach (Signed)
Received a referral for Mr. Iles, from [facility] Liaison. The Primary Care Physician does their own TOC calls.  I have assigned Deloria Lair, NP to outreach and determine if there are any Case Management needs.     [facility], [place], [place] Management [phone]

## 2021-10-31 NOTE — Patient Outreach (Signed)
[facility]) Care Management  10/31/2021  Nathaniel Curry Sep 08, 1981 [id number]  Telephone outreach for post acute assessment for care management needs.  No answer but was able to leave a message and requested a call back.  [name]. Myrtie Neither, MSN, [facility] Gerontological Nurse Practitioner [facility] Care Management [phone]

## 2021-11-01 ENCOUNTER — Other Ambulatory Visit: Payer: Self-pay | Admitting: General Surgery

## 2021-11-01 ENCOUNTER — Other Ambulatory Visit: Payer: Self-pay

## 2021-11-01 DIAGNOSIS — K5720 Diverticulitis of large intestine with perforation and abscess without bleeding: Secondary | ICD-10-CM

## 2021-11-05 NOTE — Patient Outreach (Signed)
[facility]) Care Management  11/05/2021  [name] [birth year] [id number]   Telephone outreach attempted. Reached pt's phone but only received a voicemail. Left a message and requested a return call.  [name]. Myrtie Neither, MSN, GNP-BC Gerontological Nurse Practitioner [facility] Care Management [phone]  Mr. 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 Vitug future use.  [name]. Myrtie Neither, MSN, [facility] Gerontological Nurse Practitioner [facility] Care Management [phone]

## 2021-11-07 ENCOUNTER — Other Ambulatory Visit: Payer: Self-pay | Admitting: General Surgery

## 2021-11-07 ENCOUNTER — Encounter: Payer: Self-pay | Admitting: Radiology

## 2021-11-07 ENCOUNTER — Ambulatory Visit
Admission: RE | Admit: 2021-11-07 | Discharge: 2021-11-07 | Disposition: A | Discharge status: Home / Self Care | Payer: 59 | Source: Ambulatory Visit | Attending: Student | Admitting: Student

## 2021-11-07 ENCOUNTER — Ambulatory Visit
Admission: RE | Admit: 2021-11-07 | Discharge: 2021-11-07 | Disposition: A | Discharge status: Home / Self Care | Payer: 59 | Source: Ambulatory Visit | Attending: General Surgery | Admitting: General Surgery

## 2021-11-07 DIAGNOSIS — K5720 Diverticulitis of large intestine with perforation and abscess without bleeding: Secondary | ICD-10-CM

## 2021-11-07 DIAGNOSIS — Z4682 Encounter for fitting and adjustment of non-vascular catheter: Secondary | ICD-10-CM | POA: Diagnosis not present

## 2021-11-07 DIAGNOSIS — N281 Cyst of kidney, acquired: Secondary | ICD-10-CM | POA: Diagnosis not present

## 2021-11-07 DIAGNOSIS — K429 Umbilical hernia without obstruction or gangrene: Secondary | ICD-10-CM | POA: Diagnosis not present

## 2021-11-07 DIAGNOSIS — K632 Fistula of intestine: Secondary | ICD-10-CM | POA: Diagnosis not present

## 2021-11-07 DIAGNOSIS — N200 Calculus of kidney: Secondary | ICD-10-CM | POA: Diagnosis not present

## 2021-11-07 HISTORY — PX: IR RADIOLOGIST EVAL & MGMT: IMG5224

## 2021-11-07 MED ORDER — IOPAMIDOL (ISOVUE-300) INJECTION 61%
100.0000 mL | Freq: Once | INTRAVENOUS | Status: AC | PRN
  Administered 2021-11-07: 100 mL via INTRAVENOUS

## 2021-11-07 NOTE — Progress Notes (Signed)
Referring Physician(s): Dr Ok Anis  Chief Complaint: The patient is seen in follow up today s/p diverticular abscess drain placed in IR 10/26/21  History of present illness:  Colonic diverticular abscess drain placed 10/26/21 Scheduled today for CT and evaluation of abscess Follows with Dr [name]  Completed antibiotics 2 days ago Denies fever; chills Denies pain Flushes once daily 10 cc sterile saline OP is thin light brown fluid ~20 cc daily  Scheduled to see Dr [name] 12/10/21   Past Medical History:  Diagnosis Date   Allergy    Anemia    Anxiety    Barrett's esophagus    Diverticulosis 01/11/2016   Family history of breast cancer    Family history of colon cancer 10/07/2018   Family history of melanoma    Family history of uterine cancer    GERD (gastroesophageal reflux disease)    Gout    Gout    History of kidney stones    Hypertension    Iron deficiency anemia 05/28/2017   Nephrolithiasis 02/08/2014   Obesity     Past Surgical History:  Procedure Laterality Date   BARIATRIC SURGERY     Gastric sleeve   CERVICAL FUSION     CHOLECYSTECTOMY     COLONOSCOPY WITH PROPOFOL N/A 01/11/2016   Procedure: COLONOSCOPY WITH PROPOFOL;  Surgeon: Lollie Sails, MD;  Location: [facility] ENDOSCOPY;  Service: Endoscopy;  Laterality: N/A;   ESOPHAGOGASTRODUODENOSCOPY  01/11/2016   Procedure: ESOPHAGOGASTRODUODENOSCOPY (EGD);  Surgeon: Lollie Sails, MD;  Location: [facility] ENDOSCOPY;  Service: Endoscopy;;   ESOPHAGOGASTRODUODENOSCOPY (EGD) WITH PROPOFOL N/A 08/07/2017   Procedure: ESOPHAGOGASTRODUODENOSCOPY (EGD) WITH PROPOFOL;  Surgeon: Toledo, Benay Pike, MD;  Location: [facility] ENDOSCOPY;  Service: Gastroenterology;  Laterality: N/A;   ESOPHAGOGASTRODUODENOSCOPY (EGD) WITH PROPOFOL N/A 04/29/2019   Procedure: ESOPHAGOGASTRODUODENOSCOPY (EGD) WITH PROPOFOL;  Surgeon: Lollie Sails, MD;  Location: [facility] ENDOSCOPY;  Service: Endoscopy;  Laterality: N/A;   GASTRIC ROUX-EN-Y N/A  12/17/2020   Procedure: LAPAROSCOPIC ROUX-EN-Y GASTRIC BYPASS WITH UPPER ENDOSCOPY -CONVERSION FROM LAP SLEEVE GASTRECTOMY, Hiatal Hernia Repair;  Surgeon: Greer Pickerel, MD;  Location: [facility] ORS;  Service: General;  Laterality: N/A;   HIATAL HERNIA REPAIR N/A 12/17/2020   Procedure: HERNIA REPAIR HIATAL;  Surgeon: Greer Pickerel, MD;  Location: [facility];  Service: General;  Laterality: N/A;   LASIK     TONSILLECTOMY N/A 08/06/2016   Procedure: TONSILLECTOMY;  Surgeon: Clyde Canterbury, MD;  Location: [facility] ORS;  Service: ENT;  Laterality: N/A;   UPPER GASTROINTESTINAL ENDOSCOPY      Allergies: Patient has no known allergies.  Medications: Prior to Admission medications   Medication Sig Start Date End Date Taking? Authorizing Provider  ALPRAZolam Duanne Moron) 0.5 MG tablet Take 1 tablet (0.5 mg total) by mouth once daily as needed Patient taking differently: Take 0.5 mg by mouth daily as needed for anxiety. 07/29/21     amlodipine-olmesartan (AZOR) 10-20 MG tablet Take 1 tablet by mouth once daily 07/29/21     amoxicillin-clavulanate (AUGMENTIN) 875-125 MG tablet Take 1 tablet by mouth 2 (two) times daily for 6 days. 10/30/21 11/08/21  Winferd Humphrey, PA-C  CALCIUM CITRATE PO Take 600 mg by mouth 3 (three) times daily.    [provider]  colchicine 0.6 MG tablet Take 1 tablet (0.6 mg total) by mouth 2 (two) times daily as needed (gout) Patient not taking: Reported on 10/24/2021 07/29/21     indapamide (LOZOL) 2.5 MG tablet Take 1 tablet (2.5 mg total) by mouth once daily  07/29/21     linaclotide (LINZESS) 72 MCG capsule Take 1 capsule (72 mcg total) by mouth once daily 09/30/21     metoCLOPramide (REGLAN) 5 MG tablet Take 1 tablet (5 mg total) by mouth at bedtime for 90 days 08/16/21     metoprolol tartrate (LOPRESSOR) 25 MG tablet TAKE 1 TABLET BY MOUTH 2 (TWO) TIMES DAILY. Patient not taking: Reported on 10/24/2021 12/18/20 12/18/21  Greer Pickerel, MD  Multiple Vitamins-Minerals (MULTIVITAMIN ADULT,  MINERALS, PO) Take 1 tablet by mouth daily. Bariatric with iron    [provider]  pantoprazole (PROTONIX) 40 MG tablet TAKE 1 TABLET BY MOUTH 2 (TWO) TIMES DAILY Patient taking differently: Take 40 mg by mouth 2 (two) times daily as needed (for acid reflux). 07/27/20 10/24/22  Rusty Aus, MD  phentermine 37.5 MG capsule Take 1 capsule (37.5 mg total) by mouth every morning before breakfast for 30 days Patient not taking: Reported on 10/24/2021 10/02/21     polyethylene glycol (MIRALAX / GLYCOLAX) 17 g packet Take 17 g by mouth daily as needed for moderate constipation or severe constipation. 10/30/21   Winferd Humphrey, PA-C  senna-docusate (SENOKOT-S) 8.6-50 MG tablet Take 1 tablet by mouth at bedtime as needed for mild constipation or moderate constipation. 10/30/21   Winferd Humphrey, PA-C  sertraline (ZOLOFT) 50 MG tablet Take 50 mg by mouth daily. Patient not taking: Reported on 10/24/2021 07/27/20   [provider]  sodium chloride flush (NS) 0.9 % SOLN 5 mLs by Intracatheter route daily. 10/30/21   Winferd Humphrey, PA-C  sucralfate (CARAFATE) 1 g tablet TAKE 1 TABLET BY MOUTH 4 (FOUR) TIMES DAILY BEFORE MEALS AND NIGHTLY Patient taking differently: Take 1 g by mouth 2 (two) times daily as needed (for acid reflux). 07/27/20 10/24/22  Rusty Aus, MD  venlafaxine XR (EFFEXOR-XR) 37.5 MG 24 hr capsule Take 1 capsule (37.5 mg total) by mouth once daily Patient not taking: Reported on 10/24/2021 07/29/21     venlafaxine XR (EFFEXOR-XR) 75 MG 24 hr capsule Take 1 capsule (75 mg total) by mouth once daily 08/16/21        Family History  Problem Relation Age of Onset   Alcohol abuse Mother    Cancer Mother        unk primaray- was in appendix, peritoneal,, and colon   Hypertension Mother    Uterine cancer Mother 89   Hyperlipidemia Father    Hypertension Father    Diabetes Father    Colon cancer Father 20   Hypertension Maternal Grandmother    Alcohol abuse Maternal  Grandmother    Breast cancer Maternal Grandmother        dx >50   Hypertension Maternal Grandfather    Alcohol abuse Maternal Grandfather    Melanoma Maternal Grandfather 75   Hypertension Paternal Grandmother    Kidney cancer Paternal Grandmother 37   Liver cancer Paternal Grandmother    Hypertension Paternal Grandfather     Social History   Socioeconomic History   Marital status: Single    Spouse name: Not on file   Number of children: Not on file   Years of education: Not on file   Highest education level: Not on file  Occupational History    Employer: [employer]  Tobacco Use   Smoking status: Never   Smokeless tobacco: Never  Vaping Use   Vaping Use: Never used  Substance and Sexual Activity   Alcohol use: No  Alcohol/week: 0.0 standard drinks   Drug use: No   Sexual activity: Never  Other Topics Concern   Not on file  Social History Narrative   Not on file   Social Determinants of Health   Financial Resource Strain: Not on file  Food Insecurity: Not on file  Transportation Needs: Not on file  Physical Activity: Not on file  Stress: Not on file  Social Connections: Not on file     Vital Signs: There were no vitals taken for this visit.  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  Labs:  CBC: Recent Labs    10/24/21 2130 10/25/21 0308 10/27/21 0637 10/29/21 1058  WBC 18.9* 13.6* 9.4 10.8*  HGB 14.4 13.2 11.9* 14.2  HCT 44.9 41.1 37.4* 45.0  PLT 454* 393 350 510*    COAGS: Recent Labs    10/24/21 2130  INR 1.2  APTT 49*    BMP: Recent Labs    10/25/21 0308 10/27/21 0637 10/29/21 1058 10/30/21 0506  NA 136 136 140 137  K 3.8 3.9 3.4* 3.5  CL 104 101 103 106  CO2 25 27 22 24   GLUCOSE 104* 100* 106* 98  BUN 9  <5* <5* <5*  CALCIUM 8.6* 8.6* 8.9 8.6*  CREATININE 0.93 0.87 0.75 0.75  GFRNONAA >60 >60 >60 >60    LIVER FUNCTION TESTS: Recent Labs    12/07/20 0954 12/18/20 0431 10/24/21 2130  BILITOT 1.0 1.0 1.4*  AST 25 265* 18  ALT 23 242* 19  ALKPHOS 59 60 78  PROT 8.3* 7.2 8.6*  ALBUMIN 4.3 3.6 3.8    Assessment:  Colonic diverticular abscess drain placed in IR 10/26/21 + fistula per Injection CT showing resolving- almost gone collection New stat lock and dry dressing placed Can discontinue flushes Drain now to gravity bag Keep area dry as possible- neosporin to site Return 3 weeks for drain injection only Follow with Dr [name] as planned He has good understanding of plan  Signed: Lavonia Drafts, PA-C 11/07/2021, 1:19 PM   Please refer to Dr. Annamaria Boots attestation of this note for management and plan.

## 2021-11-15 DIAGNOSIS — Z800 Family history of malignant neoplasm of digestive organs: Secondary | ICD-10-CM | POA: Diagnosis not present

## 2021-11-15 DIAGNOSIS — Z9884 Bariatric surgery status: Secondary | ICD-10-CM | POA: Diagnosis not present

## 2021-11-15 DIAGNOSIS — K5720 Diverticulitis of large intestine with perforation and abscess without bleeding: Secondary | ICD-10-CM | POA: Diagnosis not present

## 2021-11-18 DIAGNOSIS — K5732 Diverticulitis of large intestine without perforation or abscess without bleeding: Secondary | ICD-10-CM | POA: Diagnosis not present

## 2021-11-18 DIAGNOSIS — Z800 Family history of malignant neoplasm of digestive organs: Secondary | ICD-10-CM | POA: Diagnosis not present

## 2021-11-21 ENCOUNTER — Ambulatory Visit
Admission: RE | Admit: 2021-11-21 | Discharge: 2021-11-21 | Disposition: A | Discharge status: Home / Self Care | Payer: 59 | Source: Ambulatory Visit | Attending: General Surgery | Admitting: General Surgery

## 2021-11-21 ENCOUNTER — Other Ambulatory Visit (HOSPITAL_COMMUNITY): Payer: Self-pay | Admitting: Physician Assistant

## 2021-11-21 ENCOUNTER — Encounter: Payer: Self-pay | Admitting: *Deleted

## 2021-11-21 ENCOUNTER — Other Ambulatory Visit: Payer: Self-pay | Admitting: Internal Medicine

## 2021-11-21 DIAGNOSIS — K632 Fistula of intestine: Secondary | ICD-10-CM | POA: Diagnosis not present

## 2021-11-21 DIAGNOSIS — K5720 Diverticulitis of large intestine with perforation and abscess without bleeding: Secondary | ICD-10-CM

## 2021-11-21 DIAGNOSIS — Z4682 Encounter for fitting and adjustment of non-vascular catheter: Secondary | ICD-10-CM | POA: Diagnosis not present

## 2021-11-21 HISTORY — PX: IR RADIOLOGIST EVAL & MGMT: IMG5224

## 2021-11-21 NOTE — Progress Notes (Signed)
Referring Physician(s): Wilson,Eric  Chief Complaint: The patient is seen in follow up today s/p drain placement  History of present illness:  Diverticular abscess drain placed in IR 10/26/21.  Scheduled today for drain injection.  Reports completion of antibiotics and no longer flushing.  Drain site is very tender and site leaks fluid and gas.  Soils 2 bandages daily.  Reports OP of ~50cc/daily.  Denies fever, chills.  Expects surgical colectomy in future pending a colonoscopy.  Past Medical History:  Diagnosis Date   Allergy    Anemia    Anxiety    Barrett's esophagus    Diverticulosis 01/11/2016   Family history of breast cancer    Family history of colon cancer 10/07/2018   Family history of melanoma    Family history of uterine cancer    GERD (gastroesophageal reflux disease)    Gout    Gout    History of kidney stones    Hypertension    Iron deficiency anemia 05/28/2017   Nephrolithiasis 02/08/2014   Obesity     Past Surgical History:  Procedure Laterality Date   BARIATRIC SURGERY     Gastric sleeve   CERVICAL FUSION     CHOLECYSTECTOMY     COLONOSCOPY WITH PROPOFOL N/A 01/11/2016   Procedure: COLONOSCOPY WITH PROPOFOL;  Surgeon: Lollie Sails, MD;  Location: [facility] ENDOSCOPY;  Service: Endoscopy;  Laterality: N/A;   ESOPHAGOGASTRODUODENOSCOPY  01/11/2016   Procedure: ESOPHAGOGASTRODUODENOSCOPY (EGD);  Surgeon: Lollie Sails, MD;  Location: [facility] ENDOSCOPY;  Service: Endoscopy;;   ESOPHAGOGASTRODUODENOSCOPY (EGD) WITH PROPOFOL N/A 08/07/2017   Procedure: ESOPHAGOGASTRODUODENOSCOPY (EGD) WITH PROPOFOL;  Surgeon: Toledo, Benay Pike, MD;  Location: [facility] ENDOSCOPY;  Service: Gastroenterology;  Laterality: N/A;   ESOPHAGOGASTRODUODENOSCOPY (EGD) WITH PROPOFOL N/A 04/29/2019   Procedure: ESOPHAGOGASTRODUODENOSCOPY (EGD) WITH PROPOFOL;  Surgeon: Lollie Sails, MD;  Location: [facility] ENDOSCOPY;  Service: Endoscopy;  Laterality: N/A;   GASTRIC ROUX-EN-Y N/A 12/17/2020    Procedure: LAPAROSCOPIC ROUX-EN-Y GASTRIC BYPASS WITH UPPER ENDOSCOPY -CONVERSION FROM LAP SLEEVE GASTRECTOMY, Hiatal Hernia Repair;  Surgeon: Greer Pickerel, MD;  Location: [facility] ORS;  Service: General;  Laterality: N/A;   HIATAL HERNIA REPAIR N/A 12/17/2020   Procedure: HERNIA REPAIR HIATAL;  Surgeon: Greer Pickerel, MD;  Location: [facility] ORS;  Service: General;  Laterality: N/A;   IR RADIOLOGIST EVAL & MGMT  11/07/2021   LASIK     TONSILLECTOMY N/A 08/06/2016   Procedure: TONSILLECTOMY;  Surgeon: Clyde Canterbury, MD;  Location: [facility] ORS;  Service: ENT;  Laterality: N/A;   UPPER GASTROINTESTINAL ENDOSCOPY      Allergies: Patient has no known allergies.  Medications: Prior to Admission medications   Medication Sig Start Date End Date Taking? Authorizing Provider  ALPRAZolam Duanne Moron) 0.5 MG tablet Take 1 tablet (0.5 mg total) by mouth once daily as needed Patient taking differently: Take 0.5 mg by mouth daily as needed for anxiety. 07/29/21     amlodipine-olmesartan (AZOR) 10-20 MG tablet Take 1 tablet by mouth once daily 07/29/21     CALCIUM CITRATE PO Take 600 mg by mouth 3 (three) times daily.    [provider]  colchicine 0.6 MG tablet Take 1 tablet (0.6 mg total) by mouth 2 (two) times daily as needed (gout) Patient not taking: Reported on 10/24/2021 07/29/21     indapamide (LOZOL) 2.5 MG tablet Take 1 tablet (2.5 mg total) by mouth once daily 07/29/21     linaclotide (LINZESS) 72 MCG capsule Take 1 capsule (72 mcg total) by mouth once  daily 09/30/21     metoCLOPramide (REGLAN) 5 MG tablet Take 1 tablet (5 mg total) by mouth at bedtime for 90 days 08/16/21     metoprolol tartrate (LOPRESSOR) 25 MG tablet TAKE 1 TABLET BY MOUTH 2 (TWO) TIMES DAILY. Patient not taking: Reported on 10/24/2021 12/18/20 12/18/21  Greer Pickerel, MD  Multiple Vitamins-Minerals (MULTIVITAMIN ADULT, MINERALS, PO) Take 1 tablet by mouth daily. Bariatric with iron    [provider]  pantoprazole (PROTONIX) 40 MG  tablet TAKE 1 TABLET BY MOUTH 2 (TWO) TIMES DAILY Patient taking differently: Take 40 mg by mouth 2 (two) times daily as needed (for acid reflux). 07/27/20 10/24/22  Rusty Aus, MD  phentermine 37.5 MG capsule Take 1 capsule (37.5 mg total) by mouth every morning before breakfast for 30 days Patient not taking: Reported on 10/24/2021 10/02/21     polyethylene glycol (MIRALAX / GLYCOLAX) 17 g packet Take 17 g by mouth daily as needed for moderate constipation or severe constipation. 10/30/21   Winferd Humphrey, PA-C  senna-docusate (SENOKOT-S) 8.6-50 MG tablet Take 1 tablet by mouth at bedtime as needed for mild constipation or moderate constipation. 10/30/21   Winferd Humphrey, PA-C  sertraline (ZOLOFT) 50 MG tablet Take 50 mg by mouth daily. Patient not taking: Reported on 10/24/2021 07/27/20   [provider]  sodium chloride flush (NS) 0.9 % SOLN 5 mLs by Intracatheter route daily. 10/30/21   Winferd Humphrey, PA-C  sucralfate (CARAFATE) 1 g tablet TAKE 1 TABLET BY MOUTH 4 (FOUR) TIMES DAILY BEFORE MEALS AND NIGHTLY Patient taking differently: Take 1 g by mouth 2 (two) times daily as needed (for acid reflux). 07/27/20 10/24/22  Rusty Aus, MD  venlafaxine XR (EFFEXOR-XR) 37.5 MG 24 hr capsule Take 1 capsule (37.5 mg total) by mouth once daily Patient not taking: Reported on 10/24/2021 07/29/21     venlafaxine XR (EFFEXOR-XR) 75 MG 24 hr capsule Take 1 capsule (75 mg total) by mouth once daily 08/16/21        Family History  Problem Relation Age of Onset   Alcohol abuse Mother    Cancer Mother        unk primaray- was in appendix, peritoneal,, and colon   Hypertension Mother    Uterine cancer Mother 57   Hyperlipidemia Father    Hypertension Father    Diabetes Father    Colon cancer Father 38   Hypertension Maternal Grandmother    Alcohol abuse Maternal Grandmother    Breast cancer Maternal Grandmother        dx >50   Hypertension Maternal Grandfather    Alcohol abuse Maternal  Grandfather    Melanoma Maternal Grandfather 75   Hypertension Paternal Grandmother    Kidney cancer Paternal Grandmother 26   Liver cancer Paternal Grandmother    Hypertension Paternal Grandfather     Social History   Socioeconomic History   Marital status: Single    Spouse name: Not on file   Number of children: Not on file   Years of education: Not on file   Highest education level: Not on file  Occupational History    Employer: [employer]  Tobacco Use   Smoking status: Never   Smokeless tobacco: Never  Vaping Use   Vaping Use: Never used  Substance and Sexual Activity   Alcohol use: No    Alcohol/week: 0.0 standard drinks   Drug use: No   Sexual activity: Never  Other Topics Concern  Not on file  Social History Narrative   Not on file   Social Determinants of Health   Financial Resource Strain: Not on file  Food Insecurity: Not on file  Transportation Needs: Not on file  Physical Activity: Not on file  Stress: Not on file  Social Connections: Not on file     Vital Signs: There were no vitals taken for this visit.  Physical Exam Eyes:     Extraocular Movements: Extraocular movements intact.  Pulmonary:     Effort: Pulmonary effort is normal.  Abdominal:     Tenderness: There is abdominal tenderness.     Comments: Drain site with evidence of moisture, soiling of stat lock device.  Suture and stat lock are intact.  There is circumferential redness at insertion site, with induration extending approximately 1 cm radius.  Neurological:     General: No focal deficit present.     Mental Status: He is alert.  Psychiatric:        Mood and Affect: Mood normal.    Imaging: No results found.  Labs:  CBC: Recent Labs    10/24/21 2130 10/25/21 0308 10/27/21 0637 10/29/21 1058  WBC 18.9* 13.6* 9.4 10.8*  HGB 14.4 13.2 11.9* 14.2  HCT 44.9 41.1 37.4* 45.0  PLT 454* 393 350 510*    COAGS: Recent Labs    10/24/21 2130  INR 1.2   APTT 49*    BMP: Recent Labs    10/25/21 0308 10/27/21 0637 10/29/21 1058 10/30/21 0506  NA 136 136 140 137  K 3.8 3.9 3.4* 3.5  CL 104 101 103 106  CO2 25 27 22 24   GLUCOSE 104* 100* 106* 98  BUN 9 <5* <5* <5*  CALCIUM 8.6* 8.6* 8.9 8.6*  CREATININE 0.93 0.87 0.75 0.75  GFRNONAA >60 >60 >60 >60    LIVER FUNCTION TESTS: Recent Labs    12/07/20 0954 12/18/20 0431 10/24/21 2130  BILITOT 1.0 1.0 1.4*  AST 25 265* 18  ALT 23 242* 19  ALKPHOS 59 60 78  PROT 8.3* 7.2 8.6*  ALBUMIN 4.3 3.6 3.8    Assessment:  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 Will plan to exchange drain at [facility] tomorrow.   F/u drain injection in 2-3 weeks.   SignedPasty Spillers, PA 11/21/2021, 2:36 PM   Please refer to Dr. Annamaria Boots attestation of this note for management and plan.

## 2021-11-22 ENCOUNTER — Other Ambulatory Visit: Payer: Self-pay

## 2021-11-22 ENCOUNTER — Ambulatory Visit
Admission: RE | Admit: 2021-11-22 | Discharge: 2021-11-22 | Disposition: A | Discharge status: Home / Self Care | Payer: 59 | Source: Ambulatory Visit | Attending: Physician Assistant | Admitting: Physician Assistant

## 2021-11-22 DIAGNOSIS — T85618A Breakdown (mechanical) of other specified internal prosthetic devices, implants and grafts, initial encounter: Secondary | ICD-10-CM | POA: Diagnosis not present

## 2021-11-22 DIAGNOSIS — Y838 Other surgical procedures as the cause of abnormal reaction of the patient, or of later complication, without mention of misadventure at the time of the procedure: Secondary | ICD-10-CM | POA: Insufficient documentation

## 2021-11-22 DIAGNOSIS — T85698A Other mechanical complication of other specified internal prosthetic devices, implants and grafts, initial encounter: Secondary | ICD-10-CM | POA: Diagnosis not present

## 2021-11-22 HISTORY — PX: IR CATHETER TUBE CHANGE: IMG717

## 2021-11-22 MED ORDER — LIDOCAINE VISCOUS HCL 2 % MT SOLN
OROMUCOSAL | Status: AC
  Filled 2021-11-22: qty 15

## 2021-11-22 MED ORDER — IOHEXOL 350 MG/ML SOLN
15.0000 mL | Freq: Once | INTRAVENOUS | Status: AC | PRN
  Administered 2021-11-22: 15 mL
  Filled 2021-11-22: qty 15

## 2021-11-22 MED ORDER — LIDOCAINE HCL 1 % IJ SOLN
INTRAMUSCULAR | Status: AC
  Filled 2021-11-22: qty 20

## 2021-11-22 MED ORDER — PEG 3350-KCL-NA BICARB-NACL 420 G PO SOLR
ORAL | 0 refills | Status: DC
  Filled 2021-11-22: qty 4000, 1d supply, fill #0

## 2021-11-27 ENCOUNTER — Other Ambulatory Visit: Payer: Self-pay

## 2021-11-28 ENCOUNTER — Other Ambulatory Visit: Payer: 59

## 2021-11-29 ENCOUNTER — Other Ambulatory Visit: Payer: Self-pay

## 2021-12-02 ENCOUNTER — Encounter: Payer: Self-pay | Admitting: *Deleted

## 2021-12-03 ENCOUNTER — Encounter: Payer: Self-pay | Admitting: *Deleted

## 2021-12-03 ENCOUNTER — Ambulatory Visit: Payer: 59

## 2021-12-03 ENCOUNTER — Encounter
Admission: RE | Disposition: A | Discharge status: Home / Self Care | Payer: Self-pay | Source: Home / Self Care | Attending: Gastroenterology

## 2021-12-03 ENCOUNTER — Ambulatory Visit
Admission: RE | Admit: 2021-12-03 | Discharge: 2021-12-03 | Disposition: A | Discharge status: Home / Self Care | Payer: 59 | Attending: Gastroenterology | Admitting: Gastroenterology

## 2021-12-03 DIAGNOSIS — Z1211 Encounter for screening for malignant neoplasm of colon: Secondary | ICD-10-CM | POA: Diagnosis not present

## 2021-12-03 DIAGNOSIS — K640 First degree hemorrhoids: Secondary | ICD-10-CM | POA: Insufficient documentation

## 2021-12-03 DIAGNOSIS — Z9884 Bariatric surgery status: Secondary | ICD-10-CM | POA: Insufficient documentation

## 2021-12-03 DIAGNOSIS — F419 Anxiety disorder, unspecified: Secondary | ICD-10-CM | POA: Insufficient documentation

## 2021-12-03 DIAGNOSIS — I10 Essential (primary) hypertension: Secondary | ICD-10-CM | POA: Insufficient documentation

## 2021-12-03 DIAGNOSIS — Z09 Encounter for follow-up examination after completed treatment for conditions other than malignant neoplasm: Secondary | ICD-10-CM | POA: Diagnosis not present

## 2021-12-03 DIAGNOSIS — Z800 Family history of malignant neoplasm of digestive organs: Secondary | ICD-10-CM | POA: Diagnosis not present

## 2021-12-03 DIAGNOSIS — K56699 Other intestinal obstruction unspecified as to partial versus complete obstruction: Secondary | ICD-10-CM | POA: Insufficient documentation

## 2021-12-03 DIAGNOSIS — Z79899 Other long term (current) drug therapy: Secondary | ICD-10-CM | POA: Insufficient documentation

## 2021-12-03 DIAGNOSIS — K5730 Diverticulosis of large intestine without perforation or abscess without bleeding: Secondary | ICD-10-CM | POA: Insufficient documentation

## 2021-12-03 DIAGNOSIS — K219 Gastro-esophageal reflux disease without esophagitis: Secondary | ICD-10-CM | POA: Insufficient documentation

## 2021-12-03 DIAGNOSIS — E669 Obesity, unspecified: Secondary | ICD-10-CM | POA: Insufficient documentation

## 2021-12-03 DIAGNOSIS — Z6841 Body Mass Index (BMI) 40.0 and over, adult: Secondary | ICD-10-CM | POA: Diagnosis not present

## 2021-12-03 HISTORY — PX: COLONOSCOPY WITH PROPOFOL: SHX5780

## 2021-12-03 SURGERY — COLONOSCOPY WITH PROPOFOL
Anesthesia: General

## 2021-12-03 MED ORDER — SPOT INK MARKER SYRINGE KIT
PACK | SUBMUCOSAL | Status: DC | PRN
  Administered 2021-12-03: 2 mL via SUBMUCOSAL

## 2021-12-03 MED ORDER — EPHEDRINE SULFATE (PRESSORS) 50 MG/ML IJ SOLN
INTRAMUSCULAR | Status: DC | PRN
Start: 2021-12-03 — End: 2021-12-03
  Administered 2021-12-03: 7.5 mg via INTRAVENOUS

## 2021-12-03 MED ORDER — PROPOFOL 10 MG/ML IV BOLUS
INTRAVENOUS | Status: DC | PRN
  Administered 2021-12-03: 80 mg via INTRAVENOUS
  Administered 2021-12-03 (×3): 20 mg via INTRAVENOUS

## 2021-12-03 MED ORDER — SODIUM CHLORIDE 0.9 % IV SOLN: INTRAVENOUS | Status: DC

## 2021-12-03 MED ORDER — PHENYLEPHRINE HCL (PRESSORS) 10 MG/ML IV SOLN
INTRAVENOUS | Status: DC | PRN
  Administered 2021-12-03 (×2): 120 ug via INTRAVENOUS
  Administered 2021-12-03 (×2): 80 ug via INTRAVENOUS
  Administered 2021-12-03: 120 ug via INTRAVENOUS

## 2021-12-03 MED ORDER — LIDOCAINE HCL (PF) 2 % IJ SOLN
INTRAMUSCULAR | Status: DC | PRN
  Administered 2021-12-03: 50 mg via INTRADERMAL

## 2021-12-03 MED ORDER — PROPOFOL 500 MG/50ML IV EMUL
INTRAVENOUS | Status: DC | PRN
  Administered 2021-12-03: 200 ug/kg/min via INTRAVENOUS

## 2021-12-03 NOTE — Anesthesia Preprocedure Evaluation (Addendum)
Anesthesia Evaluation  Patient identified by MRN, date of birth, ID band Patient awake    Reviewed: Allergy & Precautions, H&P , NPO status , Patient's Chart, lab work & pertinent test results  History of Anesthesia Complications Negative for: history of anesthetic complications  Airway Mallampati: II  TM Distance: >3 FB Neck ROM: full    Dental  (+) Chipped   Pulmonary shortness of breath and with exertion, neg sleep apnea, neg COPD, Patient abstained from smoking.Not current smoker,    Pulmonary exam normal breath sounds clear to auscultation       Cardiovascular Exercise Tolerance: Good METShypertension, Pt. on medications (-) CAD and (-) Past MI Normal cardiovascular exam(-) dysrhythmias  Rhythm:Regular Rate:Normal  EKG 15/23: Sinus rhythm Biatrial enlargement RSR' in V1 or V2, right VCD or RVH When compared with ECG of 09/04/2020, No significant change was found Confirmed by Delora Fuel ([id number]) on 10/24/2021 11:31:41 PM   Neuro/Psych  Headaches, PSYCHIATRIC DISORDERS Anxiety Depression    GI/Hepatic Neg liver ROS, GERD  ,GASTRIC ROUX-EN-Y 2022  EGD 04/29/19 which demonstrated La Grade C esophagitis and gastritis. Duodenum was normal. There was no H pylori, Barretts, dysplasia/malignancy  Complicated diverticulitis with abscess drain   Endo/Other  neg diabetesMorbid obesity  Renal/GU negative Renal ROS  negative genitourinary   Musculoskeletal   Abdominal (+) + obese,   Peds  Hematology  (+) Blood dyscrasia, anemia ,   Anesthesia Other Findings Pt with recent admission 10/24/21 for diverticulitis with abscess requiring drain.   Past Medical History: No date: Allergy No date: Anemia No date: Anxiety No date: Barrett's esophagus 01/11/2016: Diverticulosis No date: Family history of breast cancer 10/07/2018: Family history of colon cancer No date: Family history of melanoma No date: Family history of  uterine cancer No date: GERD (gastroesophageal reflux disease) No date: Gout No date: Gout No date: History of kidney stones No date: Hypertension 05/28/2017: Iron deficiency anemia 02/08/2014: Nephrolithiasis No date: Obesity  Past Surgical History: No date: BARIATRIC SURGERY     Comment:  Gastric sleeve No date: CERVICAL FUSION No date: CHOLECYSTECTOMY 01/11/2016: COLONOSCOPY WITH PROPOFOL; N/A     Comment:  Procedure: COLONOSCOPY WITH PROPOFOL;  Surgeon: Lollie Sails, MD;  Location: [facility] ENDOSCOPY;  Service:               Endoscopy;  Laterality: N/A; 01/11/2016: ESOPHAGOGASTRODUODENOSCOPY     Comment:  Procedure: ESOPHAGOGASTRODUODENOSCOPY (EGD);  Surgeon:               Lollie Sails, MD;  Location: [facility] ENDOSCOPY;                Service: Endoscopy;; 08/07/2017: ESOPHAGOGASTRODUODENOSCOPY (EGD) WITH PROPOFOL; N/A     Comment:  Procedure: ESOPHAGOGASTRODUODENOSCOPY (EGD) WITH               PROPOFOL;  Surgeon: Toledo, Benay Pike, MD;  Location:               [facility] ENDOSCOPY;  Service: Gastroenterology;  Laterality:               N/A; 04/29/2019: ESOPHAGOGASTRODUODENOSCOPY (EGD) WITH PROPOFOL; N/A     Comment:  Procedure: ESOPHAGOGASTRODUODENOSCOPY (EGD) WITH               PROPOFOL;  Surgeon: Lollie Sails, MD;  Location:               [facility] ENDOSCOPY;  Service: Endoscopy;  Laterality: N/A; 12/17/2020: GASTRIC ROUX-EN-Y; N/A     Comment:  Procedure: LAPAROSCOPIC ROUX-EN-Y GASTRIC BYPASS WITH               UPPER ENDOSCOPY -CONVERSION FROM LAP SLEEVE GASTRECTOMY,               Hiatal Hernia Repair;  Surgeon: Greer Pickerel, MD;                Location: [facility] ORS;  Service: General;  Laterality: N/A; 12/17/2020: HIATAL HERNIA REPAIR; N/A     Comment:  Procedure: HERNIA REPAIR HIATAL;  Surgeon: Greer Pickerel,              MD;  Location: [facility] ORS;  Service: General;  Laterality:               N/A; 11/22/2021: IR CATHETER TUBE CHANGE 11/07/2021: IR RADIOLOGIST EVAL &  MGMT 11/21/2021: IR RADIOLOGIST EVAL & MGMT No date: LASIK 08/06/2016: TONSILLECTOMY; N/A     Comment:  Procedure: TONSILLECTOMY;  Surgeon: Clyde Canterbury, MD;                Location: [facility] ORS;  Service: ENT;  Laterality: N/A; No date: UPPER GASTROINTESTINAL ENDOSCOPY  BMI    Body Mass Index: 46.12 kg/m      Reproductive/Obstetrics negative OB ROS                            Anesthesia Physical Anesthesia Plan  ASA: 3  Anesthesia Plan: General   Post-op Pain Management: Minimal or no pain anticipated   Induction: Intravenous  PONV Risk Score and Plan: 2 and Propofol infusion, TIVA and Ondansetron  Airway Management Planned: Nasal Cannula  Additional Equipment: None  Intra-op Plan:   Post-operative Plan:   Informed Consent: I have reviewed the patients History and Physical, chart, labs and discussed the procedure including the risks, benefits and alternatives for the proposed anesthesia with the patient or authorized representative who has indicated his/her understanding and acceptance.     Dental advisory given  Plan Discussed with: Anesthesiologist, CRNA and Surgeon  Anesthesia Plan Comments: (Discussed risks of anesthesia with patient, including possibility of difficulty with spontaneous ventilation under anesthesia necessitating airway intervention, PONV, and rare risks such as cardiac or respiratory or neurological events, and allergic reactions. Discussed the role of CRNA in patient's perioperative care. Patient understands. Patient informed about increased incidence of above perioperative risk due to high BMI. Patient understands. )       Anesthesia Quick Evaluation

## 2021-12-03 NOTE — Interval H&P Note (Signed)
History and Physical Interval Note:  12/03/2021 1:37 PM  Nathaniel Curry  has presented today for surgery, with the diagnosis of family hx of colon cancer.  The various methods of treatment have been discussed with the patient and family. After consideration of risks, benefits and other options for treatment, the patient has consented to  Procedure(s): COLONOSCOPY WITH PROPOFOL (N/A) as a surgical intervention.  The patient's history has been reviewed, patient examined, no change in status, stable for surgery.  I have reviewed the patient's chart and labs.  Questions were answered to the patient's satisfaction.     Lesly Rubenstein  Ok to proceed with colonoscopy

## 2021-12-03 NOTE — Transfer of Care (Signed)
Immediate Anesthesia Transfer of Care Note  Patient: Nathaniel Curry  Procedure(s) Performed: COLONOSCOPY WITH PROPOFOL  Patient Location: PACU  Anesthesia Type:General  Level of Consciousness: drowsy  Airway & Oxygen Therapy: Patient Spontanous Breathing and Patient connected to nasal cannula oxygen  Post-op Assessment: Report given to RN and Post -op Vital signs reviewed and stable  Post vital signs: Reviewed and stable  Last Vitals:  Vitals Value Taken Time  BP 93/53 12/03/21 1421  Temp 36.1 C 12/03/21 1417  Pulse 51 12/03/21 1421  Resp 27 12/03/21 1421  SpO2 95 % 12/03/21 1421  Vitals shown include unvalidated device data.  Last Pain:  Vitals:   12/03/21 1417  TempSrc: Temporal  PainSc: Asleep         Complications: No notable events documented.

## 2021-12-03 NOTE — H&P (Signed)
Outpatient short stay form Pre-procedure 12/03/2021  Lesly Rubenstein, MD  Primary Physician: Rusty Aus, MD  Reason for visit:  History of complicated diverticulitis  History of present illness:    41 y/o gentleman with history of hypertension, gastric bypass, obesity, and episode of complicated diverticulitis > 5 weeks ago which required drain for an abscess. Drain still in place but repeat CT imaging shows resolved abscess but fistula present. Patient had normal colonoscopy in 2017 with good prep. Both patient's parents with colon cancer younger than 73. No blood thinners. Abdominal pain has resolved.    Current Facility-Administered Medications:    0.9 %  sodium chloride infusion, , Intravenous, Continuous, Vasco Chong, Hilton Cork, MD, Last Rate: 20 mL/hr at 12/03/21 1328, New Bag at 12/03/21 1328  Medications Prior to Admission  Medication Sig Dispense Refill Last Dose   ALPRAZolam (XANAX) 0.5 MG tablet Take 1 tablet (0.5 mg total) by mouth once daily as needed (Patient taking differently: Take 0.5 mg by mouth daily as needed for anxiety.) 30 tablet 5 Past Week   amlodipine-olmesartan (AZOR) 10-20 MG tablet Take 1 tablet by mouth once daily 90 tablet 3 12/02/2021   CALCIUM CITRATE PO Take 600 mg by mouth 3 (three) times daily.   12/02/2021   indapamide (LOZOL) 2.5 MG tablet Take 1 tablet (2.5 mg total) by mouth once daily 90 tablet 3 12/02/2021   Multiple Vitamins-Minerals (MULTIVITAMIN ADULT, MINERALS, PO) Take 1 tablet by mouth daily. Bariatric with iron   Past Week   pantoprazole (PROTONIX) 40 MG tablet TAKE 1 TABLET BY MOUTH 2 (TWO) TIMES DAILY (Patient taking differently: Take 40 mg by mouth 2 (two) times daily as needed (for acid reflux).) 180 tablet 3 12/02/2021   polyethylene glycol (MIRALAX / GLYCOLAX) 17 g packet Take 17 g by mouth daily as needed for moderate constipation or severe constipation.  0 12/03/2021   senna-docusate (SENOKOT-S) 8.6-50 MG tablet Take 1 tablet by mouth  at bedtime as needed for mild constipation or moderate constipation.   Past Week   venlafaxine XR (EFFEXOR-XR) 75 MG 24 hr capsule Take 1 capsule (75 mg total) by mouth once daily 90 capsule 3 12/02/2021   colchicine 0.6 MG tablet Take 1 tablet (0.6 mg total) by mouth 2 (two) times daily as needed (gout) (Patient not taking: Reported on 10/24/2021) 60 tablet 1    linaclotide (LINZESS) 72 MCG capsule Take 1 capsule (72 mcg total) by mouth once daily 30 capsule 1    metoCLOPramide (REGLAN) 5 MG tablet Take 1 tablet (5 mg total) by mouth at bedtime for 90 days 90 tablet 3    metoprolol tartrate (LOPRESSOR) 25 MG tablet TAKE 1 TABLET BY MOUTH 2 (TWO) TIMES DAILY. (Patient not taking: Reported on 10/24/2021) 180 tablet 3 Not Taking   phentermine 37.5 MG capsule Take 1 capsule (37.5 mg total) by mouth every morning before breakfast for 30 days (Patient not taking: Reported on 10/24/2021) 30 capsule 0    polyethylene glycol-electrolytes (NULYTELY) 420 g solution Take 4,000 mLs by mouth once for 1 dose 4000 mL 0    sertraline (ZOLOFT) 50 MG tablet Take 50 mg by mouth daily. (Patient not taking: Reported on 10/24/2021)      sodium chloride flush (NS) 0.9 % SOLN 5 mLs by Intracatheter route daily. 5 Syringe 1    sucralfate (CARAFATE) 1 g tablet TAKE 1 TABLET BY MOUTH 4 (FOUR) TIMES DAILY BEFORE MEALS AND NIGHTLY (Patient taking differently: Take 1 g by mouth 2 (  two) times daily as needed (for acid reflux).) 120 tablet 11    venlafaxine XR (EFFEXOR-XR) 37.5 MG 24 hr capsule Take 1 capsule (37.5 mg total) by mouth once daily (Patient not taking: Reported on 10/24/2021) 30 capsule 11      No Known Allergies   Past Medical History:  Diagnosis Date   Allergy    Anemia    Anxiety    Barrett's esophagus    Diverticulosis 01/11/2016   Family history of breast cancer    Family history of colon cancer 10/07/2018   Family history of melanoma    Family history of uterine cancer    GERD (gastroesophageal reflux disease)     Gout    Gout    History of kidney stones    Hypertension    Iron deficiency anemia 05/28/2017   Nephrolithiasis 02/08/2014   Obesity     Review of systems:  Otherwise negative.    Physical Exam  Gen: Alert, oriented. Appears stated age.  HEENT: PERRLA. Lungs: No respiratory distress CV: RRR Abd: soft, benign, no masses Ext: No edema    Planned procedures: Proceed with colonoscopy. The patient understands the nature of the planned procedure, indications, risks, alternatives and potential complications including but not limited to bleeding, infection, perforation, damage to internal organs and possible oversedation/side effects from anesthesia. The patient agrees and gives consent to proceed.  Please refer to procedure notes for findings, recommendations and patient disposition/instructions.     Lesly Rubenstein, MD [facility] Gastroenterology

## 2021-12-03 NOTE — Anesthesia Postprocedure Evaluation (Signed)
Anesthesia Post Note  Patient: Nathaniel Curry  Procedure(s) Performed: COLONOSCOPY WITH PROPOFOL  Patient location during evaluation: Endoscopy Anesthesia Type: General Level of consciousness: awake and alert Pain management: pain level controlled Vital Signs Assessment: post-procedure vital signs reviewed and stable Respiratory status: spontaneous breathing, nonlabored ventilation, respiratory function stable and patient connected to nasal cannula oxygen Cardiovascular status: blood pressure returned to baseline and stable Postop Assessment: no apparent nausea or vomiting Anesthetic complications: no   No notable events documented.   Last Vitals:  Vitals:   12/03/21 1427 12/03/21 1437  BP: (!) 88/45 (!) 92/57  Pulse: 66 67  Resp: (!) 27 19  Temp:    SpO2: 93% 94%    Last Pain:  Vitals:   12/03/21 1437  TempSrc:   PainSc: 0-No pain                 Arita Miss

## 2021-12-03 NOTE — Op Note (Signed)
[facility] Patient Name: Nathaniel Curry Procedure Date: 12/03/2021 1:36 PM MRN: [id number] Account #: [id number] Date of Birth: [birth year] Admit Type: Outpatient Age: 41 Room: [facility] ENDO ROOM 1 Gender: Male Note Status: Finalized Instrument Name: Colonoscope [id number] Procedure:             Colonoscopy Indications:           Follow-up of diverticulitis Providers:             Andrey Farmer MD, MD Referring MD:          Rusty Aus, MD (Referring MD) Medicines:             Monitored Anesthesia Care Complications:         No immediate complications. Procedure:             Pre-Anesthesia Assessment:                        - Prior to the procedure, a History and Physical was                         performed, and patient medications and allergies were                         reviewed. The patient is competent. The risks and                         benefits of the procedure and the sedation options and                         risks were discussed with the patient. All questions                         were answered and informed consent was obtained.                         Patient identification and proposed procedure were                         verified by the physician, the nurse, the                         anesthesiologist, the anesthetist and the technician                         in the endoscopy suite. Mental Status Examination:                         alert and oriented. Airway Examination: normal                         oropharyngeal airway and neck mobility. Respiratory                         Examination: clear to auscultation. CV Examination:                         normal. Prophylactic Antibiotics: The patient does not  require prophylactic antibiotics. Prior                         Anticoagulants: The patient has taken no previous                         anticoagulant or antiplatelet agents. ASA Grade                          Assessment: III - A patient with severe systemic                         disease. After reviewing the risks and benefits, the                         patient was deemed in satisfactory condition to                         undergo the procedure. The anesthesia plan was to use                         monitored anesthesia care (MAC). Immediately prior to                         administration of medications, the patient was                         re-assessed for adequacy to receive sedatives. The                         heart rate, respiratory rate, oxygen saturations,                         blood pressure, adequacy of pulmonary ventilation, and                         response to care were monitored throughout the                         procedure. The physical status of the patient was                         re-assessed after the procedure.                        After obtaining informed consent, the colonoscope was                         passed under direct vision. Throughout the procedure,                         the patient's blood pressure, pulse, and oxygen                         saturations were monitored continuously. The                         Colonoscope was introduced through the anus and  advanced to the the cecum, identified by appendiceal                         orifice and ileocecal valve. The colonoscopy was                         technically difficult and complex due to bowel                         stenosis. Successful completion of the procedure was                         aided by withdrawing the scope and replacing with the                         pediatric colonoscope. The patient tolerated the                         procedure well. The quality of the bowel preparation                         was good. Findings:      The perianal and digital rectal examinations were normal.      A benign-appearing, intrinsic moderate stenosis was  found in the sigmoid       colon and was traversed. This was about 25-30 cm from anal verge. Area       was tattooed with an injection of [place] ink a few centimeters distal to       stenosis.      A few small-mouthed diverticula were found in the sigmoid colon.      Internal hemorrhoids were found during retroflexion. The hemorrhoids       were Grade I (internal hemorrhoids that do not prolapse).      The exam was otherwise without abnormality on direct and retroflexion       views. Impression:            - Stricture in the sigmoid colon. Tattooed.                        - Diverticulosis in the sigmoid colon.                        - Internal hemorrhoids.                        - The examination was otherwise normal on direct and                         retroflexion views.                        - No specimens collected. Recommendation:        - Discharge patient to home.                        - Resume previous diet.                        - Continue present medications.                        -  Repeat colonoscopy in 5 years for screening purposes.                        - Return to referring physician as previously                         scheduled. Procedure Code(s):     --- Professional ---                        [phone], Colonoscopy, flexible; with directed submucosal                         injection(s), any substance Diagnosis Code(s):     --- Professional ---                        [phone], Other intestinal obstruction unspecified as                         to partial versus complete obstruction                        K64.0, First degree hemorrhoids                        K57.32, Diverticulitis of large intestine without                         perforation or abscess without bleeding                        K57.30, Diverticulosis of large intestine without                         perforation or abscess without bleeding CPT copyright 2019 American Medical Association. All rights  reserved. The codes documented in this report are preliminary and upon coder review may  be revised to meet current compliance requirements. Andrey Farmer MD, MD 12/03/2021 2:23:59 PM Number of Addenda: 0 Note Initiated On: 12/03/2021 1:36 PM Scope Withdrawal Time: 0 hours 9 minutes 19 seconds  Total Procedure Duration: 0 hours 25 minutes 52 seconds  Estimated Blood Loss:  Estimated blood loss: none.      [facility]

## 2021-12-04 ENCOUNTER — Encounter: Payer: Self-pay | Admitting: Gastroenterology

## 2021-12-10 ENCOUNTER — Other Ambulatory Visit: Payer: Self-pay

## 2021-12-10 DIAGNOSIS — K5720 Diverticulitis of large intestine with perforation and abscess without bleeding: Secondary | ICD-10-CM | POA: Diagnosis not present

## 2021-12-10 MED ORDER — METRONIDAZOLE 500 MG PO TABS
ORAL_TABLET | ORAL | 0 refills | Status: DC
  Filled 2021-12-10 – 2021-12-27 (×2): qty 6, 2d supply, fill #0

## 2021-12-18 ENCOUNTER — Other Ambulatory Visit: Payer: Self-pay | Admitting: Urology

## 2021-12-24 ENCOUNTER — Other Ambulatory Visit: Payer: Self-pay

## 2021-12-27 ENCOUNTER — Other Ambulatory Visit: Payer: Self-pay

## 2022-01-09 ENCOUNTER — Other Ambulatory Visit: Payer: 59

## 2022-01-10 ENCOUNTER — Other Ambulatory Visit: Payer: Self-pay

## 2022-01-10 ENCOUNTER — Encounter
Admission: RE | Admit: 2022-01-10 | Discharge: 2022-01-10 | Disposition: A | Discharge status: Home / Self Care | Payer: 59 | Source: Ambulatory Visit | Attending: Surgery | Admitting: Surgery

## 2022-01-10 HISTORY — DX: Personal history of other diseases of the digestive system: Z87.19

## 2022-01-13 ENCOUNTER — Other Ambulatory Visit: Payer: Self-pay

## 2022-01-13 ENCOUNTER — Other Ambulatory Visit
Admission: RE | Admit: 2022-01-13 | Discharge: 2022-01-13 | Disposition: A | Discharge status: Home / Self Care | Payer: 59 | Source: Ambulatory Visit | Attending: Surgery | Admitting: Surgery

## 2022-01-13 DIAGNOSIS — Z01818 Encounter for other preprocedural examination: Secondary | ICD-10-CM

## 2022-01-13 DIAGNOSIS — D649 Anemia, unspecified: Secondary | ICD-10-CM | POA: Diagnosis not present

## 2022-01-13 DIAGNOSIS — Z01812 Encounter for preprocedural laboratory examination: Secondary | ICD-10-CM | POA: Insufficient documentation

## 2022-01-13 DIAGNOSIS — I2510 Atherosclerotic heart disease of native coronary artery without angina pectoris: Secondary | ICD-10-CM | POA: Insufficient documentation

## 2022-01-13 LAB — BASIC METABOLIC PANEL
Anion gap: 9 (ref 5–15)
BUN: 15 mg/dL (ref 6–20)
CO2: 24 mmol/L (ref 22–32)
Calcium: 8.9 mg/dL (ref 8.9–10.3)
Chloride: 105 mmol/L (ref 98–111)
Creatinine, Ser: 0.97 mg/dL (ref 0.61–1.24)
GFR, Estimated: >60 mL/min (ref >60)
Glucose, Bld: 84 mg/dL (ref 70–99)
Potassium: 3.7 mmol/L (ref 3.5–5.1)
Sodium: 138 mmol/L (ref 135–145)

## 2022-01-13 LAB — CBC
HCT: 41.4 % (ref 39.0–52.0)
Hemoglobin: 13.2 g/dL (ref 13.0–17.0)
MCH: 26.1 pg (ref 26.0–34.0)
MCHC: 31.9 g/dL (ref 30.0–36.0)
MCV: 81.8 fL (ref 80.0–100.0)
Platelets: 367 10*3/uL (ref 150–400)
RBC: 5.06 MIL/uL (ref 4.22–5.81)
RDW: 13.5 % (ref 11.5–15.5)
WBC: 11.8 10*3/uL — ABNORMAL HIGH (ref 4.0–10.5)
nRBC: 0 % (ref 0.0–0.2)

## 2022-01-14 NOTE — Progress Notes (Addendum)
COVID swab appointment: N/A ? ?COVID Vaccine Completed:  Yes x2 ?Date COVID Vaccine completed: ?Has received booster: ?COVID vaccine manufacturer: Pfizer     ? ?Date of COVID positive in last 90 days:  No ? ?PCP - Emily Filbert, MD ?Cardiologist - N/A ? ?Chest x-ray - 10-24-21 Epic ?EKG - 10-28-21 Epic ?Stress Test - 2008 ?ECHO - N/A ?Cardiac Cath - N/A ?Pacemaker/ICD device last checked: ?Spinal Cord Stimulator: ? ?Bowel Prep - Patient has prep and instructions ? ?Sleep Study - Yes, neg  ?CPAP - No ? ?Fasting Blood Sugar - N/A ?Checks Blood Sugar _____ times a day ? ?Blood Thinner Instructions:  N/A ?Aspirin Instructions: ?Last Dose: ? ?Activity level:   Can go up a flight of stairs and perform activities of daily living without stopping and without symptoms of chest pain or shortness of breath. ?   ?Anesthesia review: N/A ? ?Patient denies shortness of breath, fever, cough and chest pain at PAT appointment (completed over the phone) ? ?Patient verbalized understanding of instructions that were given to them at the PAT appointment. Patient was also instructed that they will need to review over the PAT instructions again at home before surgery.  ?

## 2022-01-16 ENCOUNTER — Ambulatory Visit: Payer: Self-pay | Admitting: Surgery

## 2022-01-16 DIAGNOSIS — Z01818 Encounter for other preprocedural examination: Secondary | ICD-10-CM

## 2022-01-23 ENCOUNTER — Inpatient Hospital Stay (HOSPITAL_COMMUNITY)
Admission: RE | Admit: 2022-01-23 | Discharge: 2022-01-25 | DRG: 329 | Disposition: A | Discharge status: Home / Self Care | Payer: 59 | Attending: Surgery | Admitting: Surgery

## 2022-01-23 ENCOUNTER — Encounter (HOSPITAL_COMMUNITY)
Admission: RE | Disposition: A | Discharge status: Home / Self Care | Payer: Self-pay | Source: Home / Self Care | Attending: Surgery

## 2022-01-23 ENCOUNTER — Other Ambulatory Visit: Payer: Self-pay

## 2022-01-23 ENCOUNTER — Inpatient Hospital Stay (HOSPITAL_COMMUNITY): Payer: 59 | Admitting: Anesthesiology

## 2022-01-23 ENCOUNTER — Encounter (HOSPITAL_COMMUNITY): Payer: Self-pay | Admitting: Surgery

## 2022-01-23 DIAGNOSIS — K5792 Diverticulitis of intestine, part unspecified, without perforation or abscess without bleeding: Secondary | ICD-10-CM

## 2022-01-23 DIAGNOSIS — Z8249 Family history of ischemic heart disease and other diseases of the circulatory system: Secondary | ICD-10-CM

## 2022-01-23 DIAGNOSIS — Z981 Arthrodesis status: Secondary | ICD-10-CM | POA: Diagnosis not present

## 2022-01-23 DIAGNOSIS — K5720 Diverticulitis of large intestine with perforation and abscess without bleeding: Principal | ICD-10-CM | POA: Diagnosis present

## 2022-01-23 DIAGNOSIS — Z79899 Other long term (current) drug therapy: Secondary | ICD-10-CM | POA: Diagnosis not present

## 2022-01-23 DIAGNOSIS — Z800 Family history of malignant neoplasm of digestive organs: Secondary | ICD-10-CM | POA: Diagnosis not present

## 2022-01-23 DIAGNOSIS — I10 Essential (primary) hypertension: Secondary | ICD-10-CM

## 2022-01-23 DIAGNOSIS — I2510 Atherosclerotic heart disease of native coronary artery without angina pectoris: Secondary | ICD-10-CM

## 2022-01-23 DIAGNOSIS — Z6841 Body Mass Index (BMI) 40.0 and over, adult: Secondary | ICD-10-CM | POA: Diagnosis not present

## 2022-01-23 DIAGNOSIS — Z9884 Bariatric surgery status: Secondary | ICD-10-CM

## 2022-01-23 DIAGNOSIS — D649 Anemia, unspecified: Principal | ICD-10-CM

## 2022-01-23 DIAGNOSIS — K651 Peritoneal abscess: Secondary | ICD-10-CM | POA: Diagnosis not present

## 2022-01-23 DIAGNOSIS — I898 Other specified noninfective disorders of lymphatic vessels and lymph nodes: Secondary | ICD-10-CM | POA: Diagnosis not present

## 2022-01-23 DIAGNOSIS — Z9049 Acquired absence of other specified parts of digestive tract: Secondary | ICD-10-CM

## 2022-01-23 DIAGNOSIS — Z87442 Personal history of urinary calculi: Secondary | ICD-10-CM

## 2022-01-23 DIAGNOSIS — F419 Anxiety disorder, unspecified: Secondary | ICD-10-CM | POA: Diagnosis not present

## 2022-01-23 DIAGNOSIS — Z01818 Encounter for other preprocedural examination: Secondary | ICD-10-CM

## 2022-01-23 DIAGNOSIS — K5780 Diverticulitis of intestine, part unspecified, with perforation and abscess without bleeding: Secondary | ICD-10-CM | POA: Diagnosis present

## 2022-01-23 HISTORY — PX: FLEXIBLE SIGMOIDOSCOPY: SHX5431

## 2022-01-23 HISTORY — PX: XI ROBOTIC ASSISTED LOWER ANTERIOR RESECTION: SHX6558

## 2022-01-23 LAB — SURGICAL PCR SCREEN
MRSA, PCR: NEGATIVE
Staphylococcus aureus: NEGATIVE

## 2022-01-23 LAB — TYPE AND SCREEN
ABO/RH(D): A POS
Antibody Screen: NEGATIVE

## 2022-01-23 SURGERY — COLECTOMY, SIGMOID, ROBOT-ASSISTED
Anesthesia: General

## 2022-01-23 MED ORDER — LIDOCAINE HCL (PF) 2 % IJ SOLN
INTRAMUSCULAR | Status: AC
  Filled 2022-01-23: qty 5

## 2022-01-23 MED ORDER — IRBESARTAN 150 MG PO TABS
150.0000 mg | ORAL_TABLET | Freq: Every day | ORAL | Status: DC
  Administered 2022-01-24 – 2022-01-25 (×2): 150 mg via ORAL
  Filled 2022-01-23 (×2): qty 1

## 2022-01-23 MED ORDER — ALUM & MAG HYDROXIDE-SIMETH 200-200-20 MG/5ML PO SUSP: 30.0000 mL | Freq: Four times a day (QID) | ORAL | Status: DC | PRN

## 2022-01-23 MED ORDER — ONDANSETRON HCL 4 MG PO TABS: 4.0000 mg | ORAL_TABLET | Freq: Four times a day (QID) | ORAL | Status: DC | PRN

## 2022-01-23 MED ORDER — STERILE WATER FOR INJECTION IJ SOLN
INTRAMUSCULAR | Status: AC
  Filled 2022-01-23: qty 10

## 2022-01-23 MED ORDER — DEXAMETHASONE SODIUM PHOSPHATE 10 MG/ML IJ SOLN
INTRAMUSCULAR | Status: DC | PRN
  Administered 2022-01-23: 4 mg via INTRAVENOUS

## 2022-01-23 MED ORDER — FENTANYL CITRATE PF 50 MCG/ML IJ SOSY
PREFILLED_SYRINGE | INTRAMUSCULAR | Status: AC
  Filled 2022-01-23: qty 1

## 2022-01-23 MED ORDER — SUCRALFATE 1 G PO TABS: 1.0000 g | ORAL_TABLET | Freq: Two times a day (BID) | ORAL | Status: DC | PRN

## 2022-01-23 MED ORDER — MIDAZOLAM HCL 2 MG/2ML IJ SOLN
INTRAMUSCULAR | Status: AC
  Filled 2022-01-23: qty 2

## 2022-01-23 MED ORDER — HYDROMORPHONE HCL 1 MG/ML IJ SOLN
0.2500 mg | INTRAMUSCULAR | Status: DC | PRN
  Administered 2022-01-23 (×4): 0.5 mg via INTRAVENOUS

## 2022-01-23 MED ORDER — EPHEDRINE SULFATE-NACL 50-0.9 MG/10ML-% IV SOSY
PREFILLED_SYRINGE | INTRAVENOUS | Status: DC | PRN
  Administered 2022-01-23: 10 mg via INTRAVENOUS

## 2022-01-23 MED ORDER — HEPARIN SODIUM (PORCINE) 5000 UNIT/ML IJ SOLN
5000.0000 [IU] | Freq: Once | INTRAMUSCULAR | Status: AC
  Administered 2022-01-23: 5000 [IU] via SUBCUTANEOUS
  Filled 2022-01-23: qty 1

## 2022-01-23 MED ORDER — ACETAMINOPHEN 500 MG PO TABS
1000.0000 mg | ORAL_TABLET | ORAL | Status: AC
Start: 2022-01-23 — End: 2022-01-23
  Administered 2022-01-23: 1000 mg via ORAL
  Filled 2022-01-23: qty 2

## 2022-01-23 MED ORDER — SUGAMMADEX SODIUM 200 MG/2ML IV SOLN
INTRAVENOUS | Status: DC | PRN
  Administered 2022-01-23: 300 mg via INTRAVENOUS

## 2022-01-23 MED ORDER — ALVIMOPAN 12 MG PO CAPS
12.0000 mg | ORAL_CAPSULE | ORAL | Status: AC
  Administered 2022-01-23: 12 mg via ORAL
  Filled 2022-01-23: qty 1

## 2022-01-23 MED ORDER — TRAMADOL HCL 50 MG PO TABS
50.0000 mg | ORAL_TABLET | Freq: Four times a day (QID) | ORAL | Status: DC | PRN
  Administered 2022-01-23 – 2022-01-25 (×3): 50 mg via ORAL
  Filled 2022-01-23 (×3): qty 1

## 2022-01-23 MED ORDER — ROCURONIUM BROMIDE 10 MG/ML (PF) SYRINGE
PREFILLED_SYRINGE | INTRAVENOUS | Status: AC
  Filled 2022-01-23: qty 10

## 2022-01-23 MED ORDER — MIDAZOLAM HCL 2 MG/2ML IJ SOLN
1.0000 mg | INTRAMUSCULAR | Status: DC
  Filled 2022-01-23: qty 2

## 2022-01-23 MED ORDER — DEXAMETHASONE SODIUM PHOSPHATE 10 MG/ML IJ SOLN
INTRAMUSCULAR | Status: AC
  Filled 2022-01-23: qty 1

## 2022-01-23 MED ORDER — FENTANYL CITRATE PF 50 MCG/ML IJ SOSY
PREFILLED_SYRINGE | INTRAMUSCULAR | Status: AC
  Filled 2022-01-23: qty 2

## 2022-01-23 MED ORDER — SODIUM CHLORIDE 0.9 % IR SOLN
Status: DC | PRN
  Administered 2022-01-23: 1000 mL via INTRAVESICAL

## 2022-01-23 MED ORDER — CALCIUM CITRATE 950 (200 CA) MG PO TABS
200.0000 mg | ORAL_TABLET | Freq: Two times a day (BID) | ORAL | Status: DC
  Administered 2022-01-23 – 2022-01-25 (×4): 200 mg via ORAL
  Filled 2022-01-23 (×5): qty 1

## 2022-01-23 MED ORDER — ORAL CARE MOUTH RINSE: 15.0000 mL | Freq: Once | OROMUCOSAL | Status: AC

## 2022-01-23 MED ORDER — HEPARIN SODIUM (PORCINE) 5000 UNIT/ML IJ SOLN
5000.0000 [IU] | Freq: Three times a day (TID) | INTRAMUSCULAR | Status: DC
  Administered 2022-01-23 – 2022-01-25 (×5): 5000 [IU] via SUBCUTANEOUS
  Filled 2022-01-23 (×5): qty 1

## 2022-01-23 MED ORDER — ROCURONIUM BROMIDE 10 MG/ML (PF) SYRINGE
PREFILLED_SYRINGE | INTRAVENOUS | Status: DC | PRN
  Administered 2022-01-23: 20 mg via INTRAVENOUS
  Administered 2022-01-23: 10 mg via INTRAVENOUS
  Administered 2022-01-23: 100 mg via INTRAVENOUS
  Administered 2022-01-23: 30 mg via INTRAVENOUS

## 2022-01-23 MED ORDER — 0.9 % SODIUM CHLORIDE (POUR BTL) OPTIME
TOPICAL | Status: DC | PRN
  Administered 2022-01-23: 2000 mL

## 2022-01-23 MED ORDER — IBUPROFEN 400 MG PO TABS
600.0000 mg | ORAL_TABLET | Freq: Four times a day (QID) | ORAL | Status: DC | PRN
  Filled 2022-01-23: qty 1

## 2022-01-23 MED ORDER — FENTANYL CITRATE PF 50 MCG/ML IJ SOSY
25.0000 ug | PREFILLED_SYRINGE | INTRAMUSCULAR | Status: DC | PRN
  Administered 2022-01-23: 50 ug via INTRAVENOUS
  Administered 2022-01-23: 25 ug via INTRAVENOUS
  Administered 2022-01-23: 50 ug via INTRAVENOUS
  Administered 2022-01-23: 25 ug via INTRAVENOUS

## 2022-01-23 MED ORDER — HYDROMORPHONE HCL 1 MG/ML IJ SOLN
INTRAMUSCULAR | Status: AC
  Filled 2022-01-23: qty 1

## 2022-01-23 MED ORDER — KETAMINE HCL 10 MG/ML IJ SOLN
INTRAMUSCULAR | Status: DC | PRN
  Administered 2022-01-23 (×2): 15 mg via INTRAVENOUS

## 2022-01-23 MED ORDER — LACTATED RINGERS IV SOLN: INTRAVENOUS | Status: DC | PRN

## 2022-01-23 MED ORDER — OXYCODONE HCL 5 MG PO TABS: 5.0000 mg | ORAL_TABLET | Freq: Once | ORAL | Status: DC | PRN

## 2022-01-23 MED ORDER — ENSURE PRE-SURGERY PO LIQD
296.0000 mL | Freq: Once | ORAL | Status: DC
  Filled 2022-01-23: qty 296

## 2022-01-23 MED ORDER — PANTOPRAZOLE SODIUM 40 MG PO TBEC: 40.0000 mg | DELAYED_RELEASE_TABLET | Freq: Two times a day (BID) | ORAL | Status: DC | PRN

## 2022-01-23 MED ORDER — PROPOFOL 10 MG/ML IV BOLUS
INTRAVENOUS | Status: AC
  Filled 2022-01-23: qty 20

## 2022-01-23 MED ORDER — ACETAMINOPHEN 160 MG/5ML PO SOLN: 325.0000 mg | ORAL | Status: DC | PRN

## 2022-01-23 MED ORDER — BUPIVACAINE LIPOSOME 1.3 % IJ SUSP
INTRAMUSCULAR | Status: DC | PRN
  Administered 2022-01-23: 20 mL

## 2022-01-23 MED ORDER — INDAPAMIDE 1.25 MG PO TABS
2.5000 mg | ORAL_TABLET | Freq: Every day | ORAL | Status: DC
  Administered 2022-01-24 – 2022-01-25 (×2): 2.5 mg via ORAL
  Filled 2022-01-23 (×2): qty 2

## 2022-01-23 MED ORDER — FENTANYL CITRATE (PF) 250 MCG/5ML IJ SOLN
INTRAMUSCULAR | Status: AC
Start: 2022-01-23 — End: ?
  Filled 2022-01-23: qty 5

## 2022-01-23 MED ORDER — PROPOFOL 10 MG/ML IV BOLUS
INTRAVENOUS | Status: DC | PRN
  Administered 2022-01-23: 200 mg via INTRAVENOUS

## 2022-01-23 MED ORDER — LACTATED RINGERS IV SOLN: INTRAVENOUS | Status: DC

## 2022-01-23 MED ORDER — VENLAFAXINE HCL ER 75 MG PO CP24
75.0000 mg | ORAL_CAPSULE | Freq: Every day | ORAL | Status: DC
  Administered 2022-01-24 – 2022-01-25 (×2): 75 mg via ORAL
  Filled 2022-01-23 (×2): qty 1

## 2022-01-23 MED ORDER — ONDANSETRON HCL 4 MG/2ML IJ SOLN
4.0000 mg | Freq: Four times a day (QID) | INTRAMUSCULAR | Status: DC | PRN
Start: 2022-01-23 — End: 2022-01-25
  Administered 2022-01-25: 4 mg via INTRAVENOUS
  Filled 2022-01-23: qty 2

## 2022-01-23 MED ORDER — CHLORHEXIDINE GLUCONATE CLOTH 2 % EX PADS: 6.0000 | MEDICATED_PAD | Freq: Once | CUTANEOUS | Status: DC

## 2022-01-23 MED ORDER — BUPIVACAINE-EPINEPHRINE (PF) 0.25% -1:200000 IJ SOLN
INTRAMUSCULAR | Status: DC | PRN
  Administered 2022-01-23: 30 mL via PERINEURAL

## 2022-01-23 MED ORDER — ONDANSETRON HCL 4 MG/2ML IJ SOLN
INTRAMUSCULAR | Status: AC
  Filled 2022-01-23: qty 2

## 2022-01-23 MED ORDER — HYDROMORPHONE HCL 1 MG/ML IJ SOLN: 0.5000 mg | INTRAMUSCULAR | Status: DC | PRN

## 2022-01-23 MED ORDER — ENSURE PRE-SURGERY PO LIQD
592.0000 mL | Freq: Once | ORAL | Status: AC
  Administered 2022-01-23: 592 mL via ORAL
  Filled 2022-01-23: qty 592

## 2022-01-23 MED ORDER — BUPIVACAINE LIPOSOME 1.3 % IJ SUSP
INTRAMUSCULAR | Status: AC
  Filled 2022-01-23: qty 20

## 2022-01-23 MED ORDER — STERILE WATER FOR INJECTION IJ SOLN
INTRAMUSCULAR | Status: DC | PRN
  Administered 2022-01-23: 15 mL via INTRAMUSCULAR

## 2022-01-23 MED ORDER — ONDANSETRON HCL 4 MG/2ML IJ SOLN: 4.0000 mg | Freq: Once | INTRAMUSCULAR | Status: DC | PRN

## 2022-01-23 MED ORDER — AMLODIPINE-OLMESARTAN 10-20 MG PO TABS: 1.0000 | ORAL_TABLET | Freq: Every day | ORAL | Status: DC

## 2022-01-23 MED ORDER — SODIUM CHLORIDE 0.9 % IV SOLN
2.0000 g | INTRAVENOUS | Status: AC
  Administered 2022-01-23: 2 g via INTRAVENOUS
  Filled 2022-01-23: qty 2

## 2022-01-23 MED ORDER — OXYCODONE HCL 5 MG/5ML PO SOLN: 5.0000 mg | Freq: Once | ORAL | Status: DC | PRN

## 2022-01-23 MED ORDER — EPHEDRINE 5 MG/ML INJ
INTRAVENOUS | Status: AC
  Filled 2022-01-23: qty 5

## 2022-01-23 MED ORDER — MEPERIDINE HCL 50 MG/ML IJ SOLN: 6.2500 mg | INTRAMUSCULAR | Status: DC | PRN

## 2022-01-23 MED ORDER — FENTANYL CITRATE (PF) 100 MCG/2ML IJ SOLN
INTRAMUSCULAR | Status: DC | PRN
  Administered 2022-01-23: 100 ug via INTRAVENOUS
  Administered 2022-01-23 (×2): 50 ug via INTRAVENOUS

## 2022-01-23 MED ORDER — KETAMINE HCL 50 MG/5ML IJ SOSY
PREFILLED_SYRINGE | INTRAMUSCULAR | Status: AC
  Filled 2022-01-23: qty 5

## 2022-01-23 MED ORDER — DIPHENHYDRAMINE HCL 12.5 MG/5ML PO ELIX: 12.5000 mg | ORAL_SOLUTION | Freq: Four times a day (QID) | ORAL | Status: DC | PRN

## 2022-01-23 MED ORDER — BUPIVACAINE-EPINEPHRINE (PF) 0.25% -1:200000 IJ SOLN
INTRAMUSCULAR | Status: AC
  Filled 2022-01-23: qty 30

## 2022-01-23 MED ORDER — ALVIMOPAN 12 MG PO CAPS
12.0000 mg | ORAL_CAPSULE | Freq: Two times a day (BID) | ORAL | Status: DC
  Administered 2022-01-24 – 2022-01-25 (×3): 12 mg via ORAL
  Filled 2022-01-23 (×3): qty 1

## 2022-01-23 MED ORDER — ENSURE SURGERY PO LIQD
237.0000 mL | Freq: Two times a day (BID) | ORAL | Status: DC
  Administered 2022-01-24 – 2022-01-25 (×3): 237 mL via ORAL

## 2022-01-23 MED ORDER — ACETAMINOPHEN 325 MG PO TABS: 325.0000 mg | ORAL_TABLET | ORAL | Status: DC | PRN

## 2022-01-23 MED ORDER — FENTANYL CITRATE PF 50 MCG/ML IJ SOSY
50.0000 ug | PREFILLED_SYRINGE | INTRAMUSCULAR | Status: DC
  Filled 2022-01-23: qty 2

## 2022-01-23 MED ORDER — ALPRAZOLAM 0.5 MG PO TABS: 0.5000 mg | ORAL_TABLET | Freq: Every day | ORAL | Status: DC | PRN

## 2022-01-23 MED ORDER — CHLORHEXIDINE GLUCONATE 0.12 % MT SOLN
15.0000 mL | Freq: Once | OROMUCOSAL | Status: AC
  Administered 2022-01-23: 15 mL via OROMUCOSAL

## 2022-01-23 MED ORDER — DIPHENHYDRAMINE HCL 50 MG/ML IJ SOLN: 12.5000 mg | Freq: Four times a day (QID) | INTRAMUSCULAR | Status: DC | PRN

## 2022-01-23 MED ORDER — SUGAMMADEX SODIUM 500 MG/5ML IV SOLN
INTRAVENOUS | Status: AC
  Filled 2022-01-23: qty 5

## 2022-01-23 MED ORDER — BUPIVACAINE LIPOSOME 1.3 % IJ SUSP: 20.0000 mL | Freq: Once | INTRAMUSCULAR | Status: DC

## 2022-01-23 MED ORDER — LIDOCAINE 2% (20 MG/ML) 5 ML SYRINGE
INTRAMUSCULAR | Status: DC | PRN
  Administered 2022-01-23: 100 mg via INTRAVENOUS

## 2022-01-23 MED ORDER — SIMETHICONE 80 MG PO CHEW
40.0000 mg | CHEWABLE_TABLET | Freq: Four times a day (QID) | ORAL | Status: DC | PRN
  Filled 2022-01-23: qty 1

## 2022-01-23 MED ORDER — METHYLENE BLUE 0.5 % INJ SOLN
INTRAVENOUS | Status: AC
  Filled 2022-01-23: qty 10

## 2022-01-23 MED ORDER — HYDRALAZINE HCL 20 MG/ML IJ SOLN: 10.0000 mg | INTRAMUSCULAR | Status: DC | PRN

## 2022-01-23 MED ORDER — AMLODIPINE BESYLATE 10 MG PO TABS
10.0000 mg | ORAL_TABLET | Freq: Every day | ORAL | Status: DC
  Administered 2022-01-24 – 2022-01-25 (×2): 10 mg via ORAL
  Filled 2022-01-23 (×2): qty 1

## 2022-01-23 MED ORDER — MIDAZOLAM HCL 2 MG/2ML IJ SOLN
INTRAMUSCULAR | Status: DC | PRN
  Administered 2022-01-23: 2 mg via INTRAVENOUS

## 2022-01-23 MED ORDER — INDOCYANINE GREEN 25 MG IV SOLR
INTRAVENOUS | Status: DC | PRN
  Administered 2022-01-23: 2.5 mg via INTRAVENOUS

## 2022-01-23 MED ORDER — METHYLENE BLUE 0.5 % INJ SOLN
INTRAVENOUS | Status: DC | PRN
  Administered 2022-01-23: 10 mL

## 2022-01-23 MED ORDER — ACETAMINOPHEN 500 MG PO TABS
1000.0000 mg | ORAL_TABLET | Freq: Four times a day (QID) | ORAL | Status: DC
Start: 2022-01-23 — End: 2022-01-25
  Administered 2022-01-23 – 2022-01-25 (×7): 1000 mg via ORAL
  Filled 2022-01-23 (×8): qty 2

## 2022-01-23 MED ORDER — LACTATED RINGERS IR SOLN
Status: DC | PRN
  Administered 2022-01-23: 1000 mL

## 2022-01-23 MED ORDER — TRAMADOL HCL 50 MG PO TABS
50.0000 mg | ORAL_TABLET | Freq: Four times a day (QID) | ORAL | 0 refills | Status: AC | PRN
  Filled 2022-01-23: qty 15, 4d supply, fill #0

## 2022-01-23 SURGICAL SUPPLY — 135 items
ADAPTER GOLDBERG URETERAL (ADAPTER) IMPLANT
ADH SKN CLS APL DERMABOND .7 (GAUZE/BANDAGES/DRESSINGS) ×1
ADPR CATH 15X14FR FL DRN BG (ADAPTER)
APL PRP STRL LF DISP 70% ISPRP (MISCELLANEOUS) ×1
APPLIER CLIP 5 13 M/L LIGAMAX5 (MISCELLANEOUS)
APPLIER CLIP ROT 10 11.4 M/L (STAPLE)
APR CLP MED LRG 11.4X10 (STAPLE)
APR CLP MED LRG 5 ANG JAW (MISCELLANEOUS)
BAG COUNTER SPONGE SURGICOUNT (BAG) IMPLANT
BAG SPNG CNTER NS LX DISP (BAG)
BAG URO CATCHER STRL LF (MISCELLANEOUS) ×3 IMPLANT
BLADE EXTENDED COATED 6.5IN (ELECTRODE) ×3 IMPLANT
CANNULA REDUC XI 12-8 STAPL (CANNULA) ×2
CANNULA REDUCER 12-8 DVNC XI (CANNULA) ×2 IMPLANT
CATH FOLEY 3WAY 30CC 16FR (CATHETERS) ×1 IMPLANT
CATH URETL OPEN END 6FR 70 (CATHETERS) ×1 IMPLANT
CELLS DAT CNTRL 66122 CELL SVR (MISCELLANEOUS) IMPLANT
CHLORAPREP W/TINT 26 (MISCELLANEOUS) ×3 IMPLANT
CLIP APPLIE 5 13 M/L LIGAMAX5 (MISCELLANEOUS) IMPLANT
CLIP APPLIE ROT 10 11.4 M/L (STAPLE) IMPLANT
CLIP LIGATING HEM O LOK PURPLE (MISCELLANEOUS) IMPLANT
CLIP LIGATING HEMO O LOK GREEN (MISCELLANEOUS) IMPLANT
CLOTH BEACON ORANGE TIMEOUT ST (SAFETY) ×3 IMPLANT
COVER SURGICAL LIGHT HANDLE (MISCELLANEOUS) ×6 IMPLANT
COVER TIP SHEARS 8 DVNC (MISCELLANEOUS) ×2 IMPLANT
COVER TIP SHEARS 8MM DA VINCI (MISCELLANEOUS) ×2
DEFOGGER SCOPE WARMER CLEARIFY (MISCELLANEOUS) ×3 IMPLANT
DERMABOND ADVANCED (GAUZE/BANDAGES/DRESSINGS) ×1
DERMABOND ADVANCED .7 DNX12 (GAUZE/BANDAGES/DRESSINGS) IMPLANT
DEVICE TROCAR PUNCTURE CLOSURE (ENDOMECHANICALS) IMPLANT
DRAIN CHANNEL 19F RND (DRAIN) ×2 IMPLANT
DRAPE ARM DVNC X/XI (DISPOSABLE) ×8 IMPLANT
DRAPE COLUMN DVNC XI (DISPOSABLE) ×2 IMPLANT
DRAPE DA VINCI XI ARM (DISPOSABLE) ×8
DRAPE DA VINCI XI COLUMN (DISPOSABLE) ×2
DRAPE SURG IRRIG POUCH 19X23 (DRAPES) ×3 IMPLANT
DRSG OPSITE POSTOP 4X10 (GAUZE/BANDAGES/DRESSINGS) IMPLANT
DRSG OPSITE POSTOP 4X6 (GAUZE/BANDAGES/DRESSINGS) IMPLANT
DRSG OPSITE POSTOP 4X8 (GAUZE/BANDAGES/DRESSINGS) IMPLANT
DRSG TEGADERM 2-3/8X2-3/4 SM (GAUZE/BANDAGES/DRESSINGS) ×15 IMPLANT
DRSG TEGADERM 4X4.75 (GAUZE/BANDAGES/DRESSINGS) ×3 IMPLANT
ELECT REM PT RETURN 15FT ADLT (MISCELLANEOUS) ×3 IMPLANT
ENDOLOOP SUT PDS II  0 18 (SUTURE)
ENDOLOOP SUT PDS II 0 18 (SUTURE) IMPLANT
EVACUATOR SILICONE 100CC (DRAIN) ×2 IMPLANT
GAUZE SPONGE 2X2 8PLY STRL LF (GAUZE/BANDAGES/DRESSINGS) ×2 IMPLANT
GAUZE SPONGE 4X4 12PLY STRL (GAUZE/BANDAGES/DRESSINGS) IMPLANT
GLOVE SURG ENC MOIS LTX SZ7.5 (GLOVE) ×9 IMPLANT
GLOVE SURG ENC TEXT LTX SZ7.5 (GLOVE) ×3 IMPLANT
GLOVE SURG UNDER LTX SZ8 (GLOVE) ×9 IMPLANT
GOWN STRL REUS W/ TWL XL LVL3 (GOWN DISPOSABLE) ×10 IMPLANT
GOWN STRL REUS W/TWL XL LVL3 (GOWN DISPOSABLE) ×10
GRASPER SUT TROCAR 14GX15 (MISCELLANEOUS) IMPLANT
GUIDEWIRE ANG ZIPWIRE 038X150 (WIRE) IMPLANT
GUIDEWIRE STR DUAL SENSOR (WIRE) IMPLANT
HOLDER FOLEY CATH W/STRAP (MISCELLANEOUS) ×3 IMPLANT
IRRIG SUCT STRYKERFLOW 2 WTIP (MISCELLANEOUS) ×2
IRRIGATION SUCT STRKRFLW 2 WTP (MISCELLANEOUS) ×2 IMPLANT
KIT PROCEDURE DA VINCI SI (MISCELLANEOUS) ×2
KIT PROCEDURE DVNC SI (MISCELLANEOUS) ×2 IMPLANT
KIT TURNOVER KIT A (KITS) IMPLANT
MANIFOLD NEPTUNE II (INSTRUMENTS) ×3 IMPLANT
NDL INSUFFLATION 14GA 120MM (NEEDLE) ×2 IMPLANT
NEEDLE INSUFFLATION 14GA 120MM (NEEDLE) ×2 IMPLANT
PACK CARDIOVASCULAR III (CUSTOM PROCEDURE TRAY) ×3 IMPLANT
PACK COLON (CUSTOM PROCEDURE TRAY) ×3 IMPLANT
PACK CYSTO (CUSTOM PROCEDURE TRAY) ×3 IMPLANT
PAD POSITIONING PINK XL (MISCELLANEOUS) ×3 IMPLANT
PENCIL SMOKE EVACUATOR (MISCELLANEOUS) IMPLANT
PROTECTOR NERVE ULNAR (MISCELLANEOUS) ×6 IMPLANT
RELOAD STAPLE 45 3.5 BLU DVNC (STAPLE) IMPLANT
RELOAD STAPLE 45 4.3 GRN DVNC (STAPLE) IMPLANT
RELOAD STAPLE 60 3.5 BLU DVNC (STAPLE) IMPLANT
RELOAD STAPLE 60 4.3 GRN DVNC (STAPLE) IMPLANT
RELOAD STAPLER 3.5X45 BLU DVNC (STAPLE) IMPLANT
RELOAD STAPLER 3.5X60 BLU DVNC (STAPLE) IMPLANT
RELOAD STAPLER 4.3X45 GRN DVNC (STAPLE) IMPLANT
RELOAD STAPLER 4.3X60 GRN DVNC (STAPLE) ×1 IMPLANT
RETRACTOR WND ALEXIS 18 MED (MISCELLANEOUS) IMPLANT
RTRCTR WOUND ALEXIS 18CM MED (MISCELLANEOUS)
SCISSORS LAP 5X35 DISP (ENDOMECHANICALS) IMPLANT
SEAL CANN UNIV 5-8 DVNC XI (MISCELLANEOUS) ×8 IMPLANT
SEAL XI 5MM-8MM UNIVERSAL (MISCELLANEOUS) ×8
SEALER VESSEL DA VINCI XI (MISCELLANEOUS) ×2
SEALER VESSEL EXT DVNC XI (MISCELLANEOUS) ×2 IMPLANT
SLEEVE ADV FIXATION 5X100MM (TROCAR) IMPLANT
SOLUTION ELECTROLUBE (MISCELLANEOUS) ×3 IMPLANT
SPIKE FLUID TRANSFER (MISCELLANEOUS) ×3 IMPLANT
SPONGE GAUZE 2X2 STER 10/PKG (GAUZE/BANDAGES/DRESSINGS) ×1
STAPLER 60 DA VINCI SURE FORM (STAPLE) ×2
STAPLER 60 SUREFORM DVNC (STAPLE) IMPLANT
STAPLER CANNULA SEAL DVNC XI (STAPLE) ×2 IMPLANT
STAPLER CANNULA SEAL XI (STAPLE) ×2
STAPLER ECHELON POWER CIR 29 (STAPLE) IMPLANT
STAPLER ECHELON POWER CIR 31 (STAPLE) ×1 IMPLANT
STAPLER RELOAD 3.5X45 BLU DVNC (STAPLE)
STAPLER RELOAD 3.5X45 BLUE (STAPLE)
STAPLER RELOAD 3.5X60 BLU DVNC (STAPLE)
STAPLER RELOAD 3.5X60 BLUE (STAPLE)
STAPLER RELOAD 4.3X45 GREEN (STAPLE)
STAPLER RELOAD 4.3X45 GRN DVNC (STAPLE)
STAPLER RELOAD 4.3X60 GREEN (STAPLE) ×2
STAPLER RELOAD 4.3X60 GRN DVNC (STAPLE) ×1
STOPCOCK 4 WAY LG BORE MALE ST (IV SETS) ×6 IMPLANT
SURGILUBE 2OZ TUBE FLIPTOP (MISCELLANEOUS) ×3 IMPLANT
SUT MNCRL AB 4-0 PS2 18 (SUTURE) ×4 IMPLANT
SUT PDS AB 1 CT1 27 (SUTURE) ×2 IMPLANT
SUT PDS AB 1 TP1 96 (SUTURE) IMPLANT
SUT PROLENE 0 CT 2 (SUTURE) IMPLANT
SUT PROLENE 2 0 KS (SUTURE) ×3 IMPLANT
SUT PROLENE 2 0 SH DA (SUTURE) IMPLANT
SUT SILK 2 0 (SUTURE) ×2
SUT SILK 2 0 SH CR/8 (SUTURE) IMPLANT
SUT SILK 2-0 18XBRD TIE 12 (SUTURE) IMPLANT
SUT SILK 3 0 (SUTURE) ×2
SUT SILK 3 0 SH CR/8 (SUTURE) ×3 IMPLANT
SUT SILK 3-0 18XBRD TIE 12 (SUTURE) ×2 IMPLANT
SUT V-LOC BARB 180 2/0GR6 GS22 (SUTURE)
SUT VIC AB 3-0 SH 18 (SUTURE) IMPLANT
SUT VIC AB 3-0 SH 27 (SUTURE)
SUT VIC AB 3-0 SH 27XBRD (SUTURE) IMPLANT
SUT VICRYL 0 UR6 27IN ABS (SUTURE) ×3 IMPLANT
SUTURE V-LC BRB 180 2/0GR6GS22 (SUTURE) IMPLANT
SYR 10ML LL (SYRINGE) ×3 IMPLANT
SYS LAPSCP GELPORT 120MM (MISCELLANEOUS)
SYS WOUND ALEXIS 18CM MED (MISCELLANEOUS) ×2
SYSTEM LAPSCP GELPORT 120MM (MISCELLANEOUS) IMPLANT
SYSTEM WOUND ALEXIS 18CM MED (MISCELLANEOUS) ×2 IMPLANT
TAPE UMBILICAL 1/8 X36 TWILL (MISCELLANEOUS) ×3 IMPLANT
TOWEL OR NON WOVEN STRL DISP B (DISPOSABLE) ×3 IMPLANT
TRAY FOLEY MTR SLVR 16FR STAT (SET/KITS/TRAYS/PACK) ×3 IMPLANT
TROCAR ADV FIXATION 5X100MM (TROCAR) ×3 IMPLANT
TUBING CONNECTING 10 (TUBING) ×9 IMPLANT
TUBING INSUFFLATION 10FT LAP (TUBING) ×3 IMPLANT
TUBING UROLOGY SET (TUBING) IMPLANT

## 2022-01-23 NOTE — H&P (Addendum)
? ?CC: Here today for surgery ? ?HPI: ?Nathaniel Curry is an 41 y.o. male with history of complicated diverticulitis. 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 [name], 12/17/2020. He was admitted 10/24/2021 with lower abdominal pain and found on work-up to have diverticulitis with an abscess interposed between the sigmoid colon and dome of the bladder. He had not noted pneumaturia at that time. He was started on antibiotics and improved. He was discharged home 6 days later with a percutaneous drain in place. Follow-up CT scans have demonstrated improvement. 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 No evident communication with the bladder on drain studies. He was seen to follow-up with Dr. [name] and subsequently referred to see me. ? ?Colonoscopy 12/03/2021 with Dr. Andrey Farmer demonstrated benign-appearing intrinsic stenosis in the sigmoid colon that was able to be traversed. 25 to 30 cm in the anal verge and was tattooed distally. Diverticulosis. Internal hemorrhoids. Otherwise normal. ? ?He has been doing reasonably well since the drain was placed. He has not been able to have the drain clamped as he had increased leakage around the tubing. It has been on suction and generally puts out 25 to 50 cc a day of foul-smelling turbid output. He denies any evident pneumaturia or urinary tract infections. ? ?Denies any changes in his health/health hx since we met in the office. States he is ready for surgery. ? ? ?PMH: Complicated diverticulitis ? ?PSH: Sleeve (2014, [facility]), lap chole, conversion to RYGB (11/2020, Dr. [name]) ? ?FHx: Mother had colon cancer at age 55; father at age 94. Paternal GM wioth colon cancer. Patient reports negative genetics testing ~2020. Denies any other known family history of colorectal, breast, endometrial or ovarian cancer ? ?Social Hx: Denies use of tobacco/EtOH/illicit drug. 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 ? ?Review of Systems: ?A complete review of systems was obtained from the patient. I have reviewed this information and discussed as appropriate with the patient. See HPI as well for other ROS. ? ?Past Medical History:  ?Diagnosis Date  ? Allergy   ? Anemia   ? Anxiety   ? Barrett's esophagus   ? Diverticulosis 01/11/2016  ? 10-2021 coplex diverticulitis with abscess and fistula formation:s/p percutaneous drain  ? Family history of breast cancer   ? Family history of colon cancer 10/07/2018  ? Family history of melanoma   ? Family history of uterine cancer   ? GERD (gastroesophageal reflux disease)   ? Gout   ? History of hiatal hernia   ? History of kidney stones   ? Hypertension   ? Iron deficiency anemia 05/28/2017  ? MRSA (methicillin resistant Staphylococcus aureus) colonization 2011  ? neck surgery  ? Nephrolithiasis 02/08/2014  ? Obesity   ? ? ?Past Surgical History:  ?Procedure Laterality Date  ? BARIATRIC SURGERY    ? Gastric sleeve  ? CERVICAL FUSION    ? CHOLECYSTECTOMY    ? COLONOSCOPY WITH PROPOFOL N/A 01/11/2016  ? Procedure: COLONOSCOPY WITH PROPOFOL;  Surgeon: Lollie Sails, MD;  Location[facility] ENDOSCOPY;  Service: Endoscopy;  Laterality: N/A;  ? COLONOSCOPY WITH PROPOFOL N/A 12/03/2021  ? Procedure: COLONOSCOPY WITH PROPOFOL;  Surgeon: Lesly Rubenstein, MD;  Location: [facility] ENDOSCOPY;  Service: Endoscopy;  Laterality: N/A;  ? ESOPHAGOGASTRODUODENOSCOPY  01/11/2016  ? Procedure: ESOPHAGOGASTRODUODENOSCOPY (EGD);  Surgeon: Lollie Sails, MD;  Location: [facility] ENDOSCOPY;  Service:  Endoscopy;;  ? ESOPHAGOGASTRODUODENOSCOPY (EGD) WITH PROPOFOL N/A 08/07/2017  ? Procedure: ESOPHAGOGASTRODUODENOSCOPY (EGD) WITH PROPOFOL;  Surgeon: Toledo, Benay Pike, MD;  Location: [facility] ENDOSCOPY;  Service: Gastroenterology;  Laterality: N/A;  ? ESOPHAGOGASTRODUODENOSCOPY (EGD) WITH PROPOFOL N/A 04/29/2019  ? Procedure: ESOPHAGOGASTRODUODENOSCOPY (EGD) WITH PROPOFOL;  Surgeon:  Lollie Sails, MD;  Location: [facility] ENDOSCOPY;  Service: Endoscopy;  Laterality: N/A;  ? GASTRIC ROUX-EN-Y N/A 12/17/2020  ? Procedure: LAPAROSCOPIC ROUX-EN-Y GASTRIC BYPASS WITH UPPER ENDOSCOPY -CONVERSION FROM LAP SLEEVE GASTRECTOMY, Hiatal Hernia Repair;  Surgeon: Greer Pickerel, MD;  Location: [facility] ORS;  Service: General;  Laterality: N/A;  ? HIATAL HERNIA REPAIR N/A 12/17/2020  ? Procedure: HERNIA REPAIR HIATAL;  Surgeon: Greer Pickerel, MD;  Location: [facility] ORS;  Service: General;  Laterality: N/A;  ? IR CATHETER TUBE CHANGE  11/22/2021  ? IR RADIOLOGIST EVAL & MGMT  11/07/2021  ? IR RADIOLOGIST EVAL & MGMT  11/21/2021  ? LASIK    ? TONSILLECTOMY N/A 08/06/2016  ? Procedure: TONSILLECTOMY;  Surgeon: Clyde Canterbury, MD;  Location: [facility] ORS;  Service: ENT;  Laterality: N/A;  ? UPPER GASTROINTESTINAL ENDOSCOPY    ? ? ?Family History  ?Problem Relation Age of Onset  ? Alcohol abuse Mother   ? Cancer Mother   ?     unk primaray- was in appendix, peritoneal,, and colon  ? Hypertension Mother   ? Uterine cancer Mother 59  ? Hyperlipidemia Father   ? Hypertension Father   ? Diabetes Father   ? Colon cancer Father 20  ? Hypertension Maternal Grandmother   ? Alcohol abuse Maternal Grandmother   ? Breast cancer Maternal Grandmother   ?     dx >50  ? Hypertension Maternal Grandfather   ? Alcohol abuse Maternal Grandfather   ? Melanoma Maternal Grandfather 40  ? Hypertension Paternal Grandmother   ? Kidney cancer Paternal Grandmother 25  ? Liver cancer Paternal Grandmother   ? Hypertension Paternal Grandfather   ? ? ?Social:  reports that he has never smoked. He has never used smokeless tobacco. He reports that he does not drink alcohol and does not use drugs. ? ?Allergies: No Known Allergies ? ?Medications: I have reviewed the patient's current medications. ? ?No results found for this or any previous visit (from the past 48 hour(s)). ? ?No results found. ? ?ROS - all of the below systems have been reviewed with the patient and  positives are indicated with bold text ?General: chills, fever or night sweats ?Eyes: blurry vision or double vision ?ENT: epistaxis or sore throat ?Allergy/Immunology: itchy/watery eyes or nasal congestion ?Hematologic/Lymphatic: bleeding problems, blood clots or swollen lymph nodes ?Endocrine: temperature intolerance or unexpected weight changes ?Breast: new or changing breast lumps or nipple discharge ?Resp: cough, shortness of breath, or wheezing ?CV: chest pain or dyspnea on exertion ?GI: as per HPI ?GU: dysuria, trouble voiding, or hematuria ?MSK: joint pain or joint stiffness ?Neuro: TIA or stroke symptoms ?Derm: pruritus and skin lesion changes ?Psych: anxiety and depression ? ?PE ?Height 5' 8"  (1.727 m), weight (!) 138.3 kg. ?Constitutional: NAD; conversant ?Eyes: Moist conjunctiva; no lid lag; PERRL ?Lungs: Normal respiratory effort ?CV: RRR; no palpable thrills; no pitting edema ?GI: Abd soft, NT/ND; no palpable hepatosplenomegaly ?MSK: Normal range of motion of extremities ?Psychiatric: Appropriate affect; alert and oriented x3 ? ?No results found for this or any previous visit (from the past 48 hour(s)). ? ?No results found. ? ?A/P: ?LANDIS DOWDY is an 41 y.o. male with with hx of complicated  diverticulitis and now colo-drain fistula. ? ?Colonoscopy 11/2021 -intrinsic benign narrowing at 25 cm tattooed distally. This corresponds with the expected location of his pathology. No evident mass. ? ?-The anatomy and physiology of the GI tract has been reviewed with him using pictures. The pathophysiology of diverticulitis as well as indications for surgery was discussed. ?-We have discussed various different treatment options going forward. Given his complicated history with regards to his perforation, and now persistent fistula, surgery being the most definitive to address this problem. ?-We reviewed robotic assisted sigmoidectomy versus low anterior resection, possible takedown of colovesical fistula if  present, flexible sigmoidoscopy, TAP blocks, intraoperative assessment perfusion, cystoscopy/ureteral ICG by [facility]. ?-The planned procedures, material risks (including, but not limited to, pain, bleedin

## 2022-01-23 NOTE — Discharge Instructions (Signed)
POST OP INSTRUCTIONS AFTER COLON SURGERY  DIET: Be sure to include lots of fluids daily to stay hydrated - 64oz of water per day (8, 8 oz glasses).  Avoid fast food or heavy meals for the first couple of weeks as your are more likely to get nauseated. Avoid raw/uncooked fruits or vegetables for the first 4 weeks (its ok to have these if they are blended into smoothie form). If you have fruits/vegetables, make sure they are cooked until soft enough to mash on the roof of your mouth and chew your food well. Otherwise, diet as tolerated.  Take your usually prescribed home medications unless otherwise directed.  PAIN CONTROL: Pain is best controlled by a usual combination of three different methods TOGETHER: Ice/Heat Over the counter pain medication Prescription pain medication Most patients will experience some swelling and bruising around the surgical site.  Ice packs or heating pads (30-60 minutes up to 6 times a day) will help. Some people prefer to use ice alone, heat alone, alternating between ice & heat.  Experiment to what works for you.  Swelling and bruising can take several weeks to resolve.   It is helpful to take an over-the-counter pain medication regularly for the first few weeks: Ibuprofen (Motrin/Advil) - 200mg tabs - take 3 tabs (600mg) every 6 hours as needed for pain (unless you have been directed previously to avoid NSAIDs/ibuprofen) Acetaminophen (Tylenol) - you may take 650mg every 6 hours as needed. You can take this with motrin as they act differently on the body. If you are taking a narcotic pain medication that has acetaminophen in it, do not take over the counter tylenol at the same time. NOTE: You may take both of these medications together - most patients  find it most helpful when alternating between the two (i.e. Ibuprofen at 6am, tylenol at 9am, ibuprofen at 12pm ...) A  prescription for pain medication should be given to you upon discharge.  Take your pain medication as  prescribed if your pain is not adequatly controlled with the over-the-counter pain reliefs mentioned above.  Avoid getting constipated.  Between the surgery and the pain medications, it is common to experience some constipation.  Increasing fluid intake and taking a fiber supplement (such as Metamucil, Citrucel, FiberCon, MiraLax, etc) 1-2 times a day regularly will usually help prevent this problem from occurring.  A mild laxative (prune juice, Milk of Magnesia, MiraLax, etc) should be taken according to package directions if there are no bowel movements after 48 hours.    Dressing: Your incisions are covered in Dermabond which is like sterile superglue for the skin. This will come off on it's own in a couple weeks. It is waterproof and you may bathe normally starting the day after your surgery in a shower. Avoid baths/pools/lakes/oceans until your wounds have fully healed.  ACTIVITIES as tolerated:   Avoid heavy lifting (>10lbs or 1 gallon of milk) for the next 6 weeks. You may resume regular daily activities as tolerated--such as daily self-care, walking, climbing stairs--gradually increasing activities as tolerated.  If you can walk 30 minutes without difficulty, it is safe to try more intense activity such as jogging, treadmill, bicycling, low-impact aerobics.  DO NOT PUSH THROUGH PAIN.  Let pain be your guide: If it hurts to do something, don't do it. You may drive when you are no longer taking prescription pain medication, you can comfortably wear a seatbelt, and you can safely maneuver your car and apply brakes.  FOLLOW UP in our   office Please call [facility] at [phone] to set up an appointment to see your surgeon in the office for a follow-up appointment approximately 2 weeks after your surgery. Make sure that you call for this appointment the day you arrive home to insure a convenient appointment time.  9. If you have disability or family leave forms that need to be completed, you may have  them completed by your primary care physician's office; for return to work instructions, please ask our office staff and they will be happy to assist you in obtaining this documentation   When to call us [phone]: Poor pain control Reactions / problems with new medications (rash/itching, etc)  Fever over 101.5 F (38.5 C) Inability to urinate Nausea/vomiting Worsening swelling or bruising Continued bleeding from incision. Increased pain, redness, or drainage from the incision  The clinic staff is available to answer your questions during regular business hours (8:30am-5pm).  Please don't hesitate to call and ask to speak to one of our nurses for clinical concerns.   A surgeon from [facility] is always on call at the hospitals   If you have a medical emergency, go to the nearest emergency room or call 911.  [facility] [address], [address], , [place]  [address] MAIN: [phone] FAX: [phone] [email or web address]  

## 2022-01-23 NOTE — H&P (Signed)
Complication: Complicated diverticulitis ?Requested by: Dr. Nadeen Landau ? ?History of Present Illness: Nathaniel Curry is an 41 y.o. male with history of complicated diverticulitis. 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 [name], 12/17/2020. He was admitted 10/24/2021 with lower abdominal pain and found on work-up to have diverticulitis with an abscess interposed between the sigmoid colon and dome of the bladder. He had not noted pneumaturia at that time. He continues to deny pneumaturia or urinary tract infections. ?  ?Denies any changes in his health/health hx since we met in the office. States he is ready for surgery. ? ?Past Medical History:  ?Diagnosis Date  ? Allergy   ? Anemia   ? Anxiety   ? Barrett's esophagus   ? Diverticulosis 01/11/2016  ? 10-2021 coplex diverticulitis with abscess and fistula formation:s/p percutaneous drain  ? Family history of breast cancer   ? Family history of colon cancer 10/07/2018  ? Family history of melanoma   ? Family history of uterine cancer   ? GERD (gastroesophageal reflux disease)   ? Gout   ? History of hiatal hernia   ? History of kidney stones   ? Hypertension   ? Iron deficiency anemia 05/28/2017  ? MRSA (methicillin resistant Staphylococcus aureus) colonization 2011  ? neck surgery  ? Nephrolithiasis 02/08/2014  ? Obesity   ? ?Past Surgical History:  ?Procedure Laterality Date  ? BARIATRIC SURGERY    ? Gastric sleeve  ? CERVICAL FUSION    ? CHOLECYSTECTOMY    ? COLONOSCOPY WITH PROPOFOL N/A 01/11/2016  ? Procedure: COLONOSCOPY WITH PROPOFOL;  Surgeon: Lollie Sails, MD;  Location: [facility] ENDOSCOPY;  Service: Endoscopy;  Laterality: N/A;  ? COLONOSCOPY WITH PROPOFOL N/A 12/03/2021  ? Procedure: COLONOSCOPY WITH PROPOFOL;  Surgeon: Lesly Rubenstein, MD;  Location: [facility] ENDOSCOPY;  Service: Endoscopy;  Laterality: N/A;  ? ESOPHAGOGASTRODUODENOSCOPY  01/11/2016  ?  Procedure: ESOPHAGOGASTRODUODENOSCOPY (EGD);  Surgeon: Lollie Sails, MD;  Location: [facility] ENDOSCOPY;  Service: Endoscopy;;  ? ESOPHAGOGASTRODUODENOSCOPY (EGD) WITH PROPOFOL N/A 08/07/2017  ? Procedure: ESOPHAGOGASTRODUODENOSCOPY (EGD) WITH PROPOFOL;  Surgeon: Toledo, Benay Pike, MD;  Location: [facility] ENDOSCOPY;  Service: Gastroenterology;  Laterality: N/A;  ? ESOPHAGOGASTRODUODENOSCOPY (EGD) WITH PROPOFOL N/A 04/29/2019  ? Procedure: ESOPHAGOGASTRODUODENOSCOPY (EGD) WITH PROPOFOL;  Surgeon: Lollie Sails, MD;  Location: [facility] ENDOSCOPY;  Service: Endoscopy;  Laterality: N/A;  ? GASTRIC ROUX-EN-Y N/A 12/17/2020  ? Procedure: LAPAROSCOPIC ROUX-EN-Y GASTRIC BYPASS WITH UPPER ENDOSCOPY -CONVERSION FROM LAP SLEEVE GASTRECTOMY, Hiatal Hernia Repair;  Surgeon: Greer Pickerel, MD;  Location: [facility] ORS;  Service: General;  Laterality: N/A;  ? HIATAL HERNIA REPAIR N/A 12/17/2020  ? Procedure: HERNIA REPAIR HIATAL;  Surgeon: Greer Pickerel, MD;  Location: [facility] ORS;  Service: General;  Laterality: N/A;  ? IR CATHETER TUBE CHANGE  11/22/2021  ? IR RADIOLOGIST EVAL & MGMT  11/07/2021  ? IR RADIOLOGIST EVAL & MGMT  11/21/2021  ? LASIK    ? TONSILLECTOMY N/A 08/06/2016  ? Procedure: TONSILLECTOMY;  Surgeon: Clyde Canterbury, MD;  Location: [facility] ORS;  Service: ENT;  Laterality: N/A;  ? UPPER GASTROINTESTINAL ENDOSCOPY    ? ? ?Home Medications:  ?Medications Prior to Admission  ?Medication Sig Dispense Refill Last Dose  ? ALPRAZolam (XANAX) 0.5 MG tablet Take 1 tablet (0.5 mg total) by mouth once daily as needed (Patient taking differently: Take 0.5 mg by mouth daily as needed for anxiety.) 30 tablet 5  Past Week  ? amlodipine-olmesartan (AZOR) 10-20 MG tablet Take 1 tablet by mouth once daily 90 tablet 3 01/22/2022  ? CALCIUM CITRATE PO Take 600 mg by mouth 3 (three) times daily.   01/22/2022  ? colchicine 0.6 MG tablet Take 1 tablet (0.6 mg total) by mouth 2 (two) times daily as needed (gout) 60 tablet 1 unk  ? indapamide (LOZOL) 2.5 MG tablet Take 1  tablet (2.5 mg total) by mouth once daily 90 tablet 3 01/22/2022  ? linaclotide (LINZESS) 72 MCG capsule Take 1 capsule (72 mcg total) by mouth once daily (Patient taking differently: Take 72 mcg by mouth daily as needed (constipation).) 30 capsule 1 unk  ? metoCLOPramide (REGLAN) 5 MG tablet Take 1 tablet (5 mg total) by mouth at bedtime for 90 days 90 tablet 3 unk  ? metroNIDAZOLE (FLAGYL) 500 MG tablet Take 1 tablet (500 mg total) by mouth 3 (three) times daily. Take according to your procedure colon prep instructions 6 tablet 0 01/22/2022  ? Multiple Vitamins-Minerals (MULTIVITAMIN ADULT, MINERALS, PO) Take 1 tablet by mouth daily. Bariatric with iron   01/22/2022  ? pantoprazole (PROTONIX) 40 MG tablet TAKE 1 TABLET BY MOUTH 2 (TWO) TIMES DAILY (Patient taking differently: Take 40 mg by mouth 2 (two) times daily as needed (for acid reflux).) 180 tablet 3 01/23/2022 at 0600  ? sucralfate (CARAFATE) 1 g tablet TAKE 1 TABLET BY MOUTH 4 (FOUR) TIMES DAILY BEFORE MEALS AND NIGHTLY (Patient taking differently: Take 1 g by mouth 2 (two) times daily as needed (for acid reflux).) 120 tablet 11 Past Month  ? venlafaxine XR (EFFEXOR-XR) 75 MG 24 hr capsule Take 1 capsule (75 mg total) by mouth once daily (Patient taking differently: Take 75 mg by mouth daily with breakfast.) 90 capsule 3 01/22/2022  ? phentermine 37.5 MG capsule Take 1 capsule (37.5 mg total) by mouth every morning before breakfast for 30 days (Patient not taking: Reported on 10/24/2021) 30 capsule 0   ? ?Allergies: No Known Allergies ? ?Family History  ?Problem Relation Age of Onset  ? Alcohol abuse Mother   ? Cancer Mother   ?     unk primaray- was in appendix, peritoneal,, and colon  ? Hypertension Mother   ? Uterine cancer Mother 32  ? Hyperlipidemia Father   ? Hypertension Father   ? Diabetes Father   ? Colon cancer Father 35  ? Hypertension Maternal Grandmother   ? Alcohol abuse Maternal Grandmother   ? Breast cancer Maternal Grandmother   ?     dx >50  ?  Hypertension Maternal Grandfather   ? Alcohol abuse Maternal Grandfather   ? Melanoma Maternal Grandfather 41  ? Hypertension Paternal Grandmother   ? Kidney cancer Paternal Grandmother 28  ? Liver cancer Paternal Grandmother   ? Hypertension Paternal Grandfather   ? ?Social History:  reports that he has never smoked. He has never used smokeless tobacco. He reports that he does not drink alcohol and does not use drugs. ? ?ROS: ?A complete review of systems was performed.  All systems are negative except for pertinent findings as noted. ?Review of Systems  ?All other systems reviewed and are negative. ? ? ?Physical Exam:  ?Vital signs in last 24 hours: ?Temp:  [98.7 ?F (37.1 ?C)] 98.7 ?F (37.1 ?C) (04/06 1124) ?Pulse Rate:  [86] 86 (04/06 1124) ?Resp:  [19] 19 (04/06 1124) ?BP: (169)/(102) 169/102 (04/06 1124) ?SpO2:  [100 %] 100 % (04/06 1124) ?General:  Alert and oriented,  No acute distress ?HEENT: Normocephalic, atraumatic ?Neck: No JVD or lymphadenopathy ?Cardiovascular: Regular rate and rhythm ?Lungs: Regular rate and effort ?Abdomen: Soft, nontender, nondistended, no abdominal masses ?Back: No CVA tenderness ?Extremities: No edema ?Neurologic: Grossly intact ? ?Laboratory Data:  ?No results found for this or any previous visit (from the past 24 hour(s)). ?No results found for this or any previous visit (from the past 240 hour(s)). ?Creatinine: ?No results for input(s): CREATININE in the last 168 hours. ?January 2023 CT images reviewed ? ?Impression/Assessment:  ?Complicated diverticulitis, pelvic fistula ? ?Plan:  ?I discussed with the patient the nature, potential benefits, risks and alternatives to cystoscopy with RG injection of forefly, including side effects of the proposed treatment, the likelihood of the patient achieving the goals of the procedure, and any potential problems that might occur during the procedure or recuperation. Discussed bladder repair and foley if needed. All questions answered.  Patient elects to proceed.  ? ? ?Festus Aloe ?01/23/2022, 11:55 AM  ? ?

## 2022-01-23 NOTE — Anesthesia Procedure Notes (Addendum)
Procedure Name: Intubation ?Date/Time: 01/23/2022 12:23 PM ?Performed by: Niel Hummer, CRNA ?Pre-anesthesia Checklist: Patient identified, Emergency Drugs available, Suction available and Patient being monitored ?Patient Re-evaluated:Patient Re-evaluated prior to induction ?Oxygen Delivery Method: Circle system utilized ?Preoxygenation: Pre-oxygenation with 100% oxygen ?Induction Type: IV induction ?Ventilation: Mask ventilation without difficulty ?Laryngoscope Size: Mac and 4 ?Grade View: Grade I ?Tube type: Oral ?Tube size: 7.5 mm ?Number of attempts: 1 ?Airway Equipment and Method: Stylet ?Placement Confirmation: ETT inserted through vocal cords under direct vision, positive ETCO2 and breath sounds checked- equal and bilateral ?Secured at: 24 cm ?Tube secured with: Tape ?Dental Injury: Teeth and Oropharynx as per pre-operative assessment  ? ? ? ? ?

## 2022-01-23 NOTE — Anesthesia Postprocedure Evaluation (Signed)
Anesthesia Post Note ? ?Patient: HARMON BOMMARITO ? ?Procedure(s) Performed: XI ROBOT ASSISTED SIGMOIDECTOMY, BILATERAL TAP BLOCK ?XI ROBOTIC ASSISTED LOWER ANTERIOR RESECTION ?FLEXIBLE SIGMOIDOSCOPY, INTRAOPERATIVE ASSESSMENT OF PERFUSION ICG ?CYSTOSCOPY with FIREFLY INJECTION ? ?  ? ?Patient location during evaluation: PACU ?Anesthesia Type: General ?Level of consciousness: awake and alert ?Pain management: pain level controlled ?Vital Signs Assessment: post-procedure vital signs reviewed and stable ?Respiratory status: spontaneous breathing, nonlabored ventilation, respiratory function stable and patient connected to nasal cannula oxygen ?Cardiovascular status: blood pressure returned to baseline and stable ?Postop Assessment: no apparent nausea or vomiting ?Anesthetic complications: no ? ? ?No notable events documented. ? ?Last Vitals:  ?Vitals:  ? 01/23/22 1700 01/23/22 1737  ?BP: (!) 145/78 124/80  ?Pulse: (!) 58 68  ?Resp: 16 19  ?Temp: 36.4 ?C 37.1 ?C  ?SpO2: 96% 100%  ?  ?Last Pain:  ?Vitals:  ? 01/23/22 1737  ?TempSrc: Oral  ?PainSc: 7   ? ? ?  ?  ?  ?  ?  ?  ? ?Shervin Cypert ? ? ? ? ?

## 2022-01-23 NOTE — Op Note (Signed)
Preoperative diagnosis: Complicated diverticulitis, pelvic fistula ?Postoperative diagnosis: Same ? ?Procedure: Cystoscopy with bilateral retrograde injection of firefly ? ?Surgeon: Junious Silk ? ?Anesthesia: General ? ?Indication for procedure: Nathaniel Curry is a 41 year old male with a history of complicated diverticulitis, pelvic fistula and drain.  He presents today with Dr. Dema Severin for sigmoidectomy.  He denies any pneumaturia, dysuria or gross hematuria.  No prior urologic issues or surgery.  No voiding complaints. ? ?Findings: On exam the penis, meatus appeared normal.  Penis circumcised.  Scrotum appeared normal. ? ?On cystoscopy the urethra, prostate and bladder were unremarkable.  Trigone and ureteral orifice ease were in their normal orthotopic position.  Clear effflux.  No mucosal lesions.  No stone or foreign body in the bladder.  No air in the bladder.  No sign of colovesical fistula. ? ?Description of procedure: After consent was obtained patient brought to the operating room.  After adequate anesthesia was placed in lithotomy position and prepped and draped in the usual sterile fashion.  Timeout was performed to confirm the patient and procedure.  Cystoscope was passed per urethra and the bladder carefully inspected.  I was able to get a good look at the dome and the anterior bladder, bladder neck with a 30 degree lens.  Patient had a good capacity bladder.  The right ureteral orifice was cannulated with a 6 [place] open-ended catheter and retrograde injection of 7.5 mL of firefly was performed.  The catheter and injection went easily without resistance.  Similarly the left ureteral orifice was cannulated with a 6 [place] open-ended catheter and retrograde injection of firefly was performed.  7.5 mL.  Again no resistance to the catheter or injection.  The scope was removed and a three-way Foley catheter placed.  Balloon inflated and seated at the bladder neck.  Catheter draining clear green-tinged urine.  Patient  was turned over to the care of Dr. Dema Severin. ? ?Complications: None ? ?Blood loss: None ? ?Specimens: None ? ?Drains: Three-way Foley catheter ? ?Disposition: Patient turned over to the care of Dr. Dema Severin for his procedure. ? ? ?

## 2022-01-23 NOTE — Transfer of Care (Signed)
Immediate Anesthesia Transfer of Care Note ? ?Patient: Nathaniel Curry ? ?Procedure(s) Performed: XI ROBOT ASSISTED SIGMOIDECTOMY, BILATERAL TAP BLOCK ?XI ROBOTIC ASSISTED LOWER ANTERIOR RESECTION ?FLEXIBLE SIGMOIDOSCOPY, INTRAOPERATIVE ASSESSMENT OF PERFUSION ICG ?CYSTOSCOPY with FIREFLY INJECTION ? ?Patient Location: PACU ? ?Anesthesia Type:General ? ?Level of Consciousness: drowsy ? ?Airway & Oxygen Therapy: Patient Spontanous Breathing and Patient connected to face mask oxygen ? ?Post-op Assessment: Report given to RN, Post -op Vital signs reviewed and stable and Patient moving all extremities X 4 ? ?Post vital signs: Reviewed and stable ? ?Last Vitals:  ?Vitals Value Taken Time  ?BP 151/100 01/23/22 1523  ?Temp    ?Pulse 76 01/23/22 1525  ?Resp 20 01/23/22 1525  ?SpO2 98 % 01/23/22 1525  ?Vitals shown include unvalidated device data. ? ?Last Pain:  ?Vitals:  ? 01/23/22 1137  ?TempSrc:   ?PainSc: 0-No pain  ?   ? ?  ? ?Complications: No notable events documented. ?

## 2022-01-23 NOTE — Anesthesia Preprocedure Evaluation (Addendum)
Anesthesia Evaluation  ?Patient identified by MRN, date of birth, ID band ?Patient awake ? ? ? ?Reviewed: ?Allergy & Precautions, NPO status , Patient's Chart, lab work & pertinent test results, reviewed documented beta blocker date and time  ? ?Airway ?Mallampati: II ? ?TM Distance: >3 FB ?Neck ROM: Full ? ? ? Dental ? ?(+) Teeth Intact, Dental Advisory Given ?  ?Pulmonary ?neg pulmonary ROS,  ?  ?Pulmonary exam normal ?breath sounds clear to auscultation ? ? ? ? ? ? Cardiovascular ?hypertension, Pt. on medications and Pt. on home beta blockers ?Normal cardiovascular exam ?Rhythm:Regular Rate:Normal ? ?11-21 EKG SR R 78 ?155/80 in preop, usually runs lower  ?  ?Neuro/Psych ? Headaches, PSYCHIATRIC DISORDERS Anxiety   ? GI/Hepatic ?Neg liver ROS, hiatal hernia, GERD  Medicated and Controlled,S/p sleeve gastrectomy- being converted to roux-en-y ?Severe GERD  ?  ?Endo/Other  ?Morbid obesityBMI 52 ? Renal/GU ?Renal InsufficiencyRenal diseaseCr 1.28   ?negative genitourinary ?  ?Musculoskeletal ?negative musculoskeletal ROS ?(+)  ? Abdominal ?(+) + obese,   ?Peds ? Hematology ? ?(+) Blood dyscrasia, anemia , hct 46.3   ?Anesthesia Other Findings ? ? Reproductive/Obstetrics ?negative OB ROS ? ?  ? ? ? ? ? ? ? ? ? ? ? ? ? ?  ?  ? ? ? ? ? ? ? ? ?Anesthesia Physical ? ?Anesthesia Plan ? ?ASA: 3 ? ?Anesthesia Plan: General  ? ?Post-op Pain Management: Minimal or no pain anticipated, Tylenol PO (pre-op)* and Celebrex PO (pre-op)*  ? ?Induction: Intravenous and Cricoid pressure planned ? ?PONV Risk Score and Plan: 4 or greater and Treatment may vary due to age or medical condition, Midazolam, Ondansetron, Dexamethasone, Scopolamine patch - Pre-op and Aprepitant ? ?Airway Management Planned: Oral ETT ? ?Additional Equipment: None ? ?Intra-op Plan:  ? ?Post-operative Plan: Extubation in OR ? ?Informed Consent: I have reviewed the patients History and Physical, chart, labs and discussed the  procedure including the risks, benefits and alternatives for the proposed anesthesia with the patient or authorized representative who has indicated his/her understanding and acceptance.  ? ? ? ?Dental advisory given ? ?Plan Discussed with: CRNA and Anesthesiologist ? ?Anesthesia Plan Comments:   ? ? ? ? ? ?Anesthesia Quick Evaluation ? ?

## 2022-01-23 NOTE — Op Note (Signed)
PATIENT: Nathaniel Curry  41 y.o. male ? ?Patient Care Team: ?Rusty Aus, MD as PCP - General (Internal Medicine) ? ?PREOP DIAGNOSIS: DIVERTICULITIS ? ?POSTOP DIAGNOSIS: DIVERTICULITIS ? ?PROCEDURE:  ?Robotic assisted low anterior resection ?Drainage of intra-abdominal abscess 3 x 3 cm ?Intraoperative assessment of perfusion using ICG fluorescence imaging ?Flexible sigmoidoscopy ?Bilateral transversus abdominus plane (TAP) blocks ? ?(Cystoscopy/ureteral ICG by Dr. Junious Silk) ? ?SURGEON: Sharon Mt. Gadiel John, MD ? ?ASSISTANT: [name], MD ? ?ANESTHESIA: General endotracheal ? ?EBL: 50 mL ?Total I/O ?In: 1600 [I.V.:1500; IV Piggyback:100] ?Out: 300 [Urine:250; Blood:50] ? ?DRAINS: None ? ?SPECIMEN: Rectosigmoid colon - open end proximal ? ?COUNTS: Sponge, needle and instrument counts were reported correct x2 ? ?FINDINGS: Dense fibrosis and evidence of prior diverticulitis/contained perforation. Intra-abdominal abscess between sigmoid and bladder. No evident communication with the bladder.  Abscess was drained and we are then able to remove the drainage catheter. ? ?Associated involved mid/distal sigmoid colon was resected. ? ?Bladder backfilled and demonstrated no evident involvement or leak. ? ? A well perfused, tension free, hemostatic, air tight 31 mm EEA colo-rectal anastomosis fashioned 18 cm from the anal verge by flexible sigmoidoscopy. ? ?NARRATIVE: ?Informed consent was verified. The patient was taken to the operating room, placed supine on the operating table and SCD's were applied. General endotracheal anesthesia was induced without difficulty. He was then positioned in the lithotomy position with Allen stirrups.  Pressure points were evaluated and padded.  Hair on the abdomen was clipped.  He was secured to the operating table and a pink pad was additionally used. Dr. Junious Silk from Urology scrubbed for cystoscopy/ureteral ICG. The abdomen was then prepped and draped in the standard sterile fashion.  Surgical timeout was called indicating the correct patient, procedure, positioning and need for preoperative antibiotics. ?  ?An OG tube was placed by anesthesia and confirmed to be to suction.  At Palmer's point, a stab incision was created and the Veress needle was introduced into the peritoneal cavity on the first attempt.  Intraperitoneal location was confirmed by the aspiration and saline drop test.  Pneumoperitoneum was established to a maximum pressure of 15 mmHg using CO2.  Following this, the abdomen was marked for planned trocar sites.  Just to the right and cephalad to the umbilicus, an 8 mm incision was created and an 8 mm blunt tipped robotic trocar was cautiously placed into the peritoneal cavity.  The laparoscope was inserted and demonstrated no evidence of trocar site nor Veress needle site complications.  The Veress needle was removed.  Bilateral transversus abdominis plane blocks were then created using a dilute mixture of Exparel with Marcaine.  3 additional 8 mm robotic trochars were placed under direct visualization roughly in a line extending from the right ASIS towards the left upper quadrant. The bladder was inspected and noted to be at/below the pubic symphysis.  Staying 3 fingerbreadths above the pubic symphysis, an incision was created and the 12 mm robotic trocar inserted directed cephalad into the peritoneal cavity under direct visualization.  An additional 5 mm assist port was placed in the right lateral abdomen under direct visualization.  The abdomen was surveyed and there was no significant intra-abdominal adhesions with the exception of what was in his pelvis.  He was positioned in Trendelenburg with the left side tilted slightly up.  Small bowel was carefully retracted out of the pelvis.  The robot was then docked and I went to the console. ?  ?The sigmoid colon was readily identified.  The  mid/distal portion of the sigmoid was densely fibrotic and adherent to the low midline  abdomen.  We began by working and dissecting this away from the abdominal wall.  There is also an intra-abdominal abscess that was about 3 x 3 cm in size containing some purulent fluid and was where his IR placed drain resided.  We were able to dissect the colon from this and then drain the abscess cavity.  Hemostasis was then achieved within the abscess cavity. ? ?The fibrosis of the sigmoid extended along his left pelvic sidewall.  We were able to stay in a plane medial to the ureter.  We were able to identify the left ureter proximally and traced it down to this phlegmon.  It was not grossly involved by this process and again we were able to dissect this well away from it without significant difficulty. ? ?Attachments of the sigmoid colon were taken down from the intersigmoid fossa.  The rectosigmoid colon was grasped and elevated anteriorly.   ? ?We then approached the sigmoid colon with a medial to lateral approach. The peritoneum overlying the presacral space was carefully incised.  The TME plane was readily gained working in a plane between the fascia propria of the rectum and the presacral fascia.  Hypogastric nerves were seen going along the the presacral fascia and were protected free of injury.  Working more proximally, the mesorectum and sigmoid mesentery were carefully mobilized off of the peritoneum.  The left ureter was again identified and protected free of injury.  The left gonadal vessels were identified and protected.  These were both swept "down."  The superior hemorrhoidal and IMA pedicles were identified. Further mesocolon was mobilized proximally staying in this plane between the retroperitoneum proper and the mesocolon. Attention was then turned to the lateral portion of dissection.  The sigmoid colon was then retracted to the right.  The sigmoid colon was fully mobilized. The descending colon was mobilized by incising the Adabella Stanis line of Toldt.  This was done all the way up to the level of the  splenic flexure. ? ?The associated mesocolon was also mobilized medially.  The left ureter again was confirmed to be well away from the vasculature which had been dissected medially.  The rectosigmoid colon was elevated inspected anteriorly. The left ureter was re-identified. The IMA was clear of this. The IMA was then divided with the vessel sealer. The stump was and noted to be completely hemostatic with a good seal.  The mesentery was divided out to the point of planned proximal division. ?  ?Working more distally, the rectum was identified where the tinea had splayed and there were loss of appendices epiploica.  This also corresponded to a location overlying the sacral promontory.  Anatomically, this clearly represents the proximal rectum.  The mesentery out to this level was then cleared using the vessel sealer. The distal point of transection on the proximal rectum was identified.   A 60 mm green load robotic stapler was then placed through the 12 mm port and introduced into the peritoneal cavity.  The rectum was divided with 1 firing of the stapler.  The stump was intact and healthy in appearance. ?  ?Attention was turned to performing a perfusion test. ICG was administered by anesthesia and at the level of the cleared mesentery proximally, there was excellent uptake of the tracer.  The rectum was also well perfused in appearance.  There was a visible pulse in the mesentery out to the level of the  cleared colon at the level of the proximal sigmoid/descending colon junction.  This colon is also supple and healthy in appearance without any thickening.  This reached into the pelvis without any difficulty and remained in that location without any tension. ? ?The bladder was then backfilled with 300 cc of dilute methylene blue.  There is no extravasation of blue dye from the bladder.  The bladder is nicely distended. ? ?A locking grasper was then placed on the sigmoid staple line. ?  ?Attention was turned to the  extracorporeal portion of the procedure.  The robot was undocked.  I scrubbed back in.  The percutaneous drain was removed intact.  This was discarded.  Using the 12 mm trocar site, a Pfannenstiel incision w

## 2022-01-24 ENCOUNTER — Encounter (HOSPITAL_COMMUNITY): Payer: Self-pay | Admitting: Surgery

## 2022-01-24 LAB — CBC
HCT: 37.6 % — ABNORMAL LOW (ref 39.0–52.0)
Hemoglobin: 12 g/dL — ABNORMAL LOW (ref 13.0–17.0)
MCH: 26.5 pg (ref 26.0–34.0)
MCHC: 31.9 g/dL (ref 30.0–36.0)
MCV: 83.2 fL (ref 80.0–100.0)
Platelets: 303 10*3/uL (ref 150–400)
RBC: 4.52 MIL/uL (ref 4.22–5.81)
RDW: 13.6 % (ref 11.5–15.5)
WBC: 15.7 10*3/uL — ABNORMAL HIGH (ref 4.0–10.5)
nRBC: 0 % (ref 0.0–0.2)

## 2022-01-24 LAB — BASIC METABOLIC PANEL
Anion gap: 5 (ref 5–15)
BUN: 8 mg/dL (ref 6–20)
CO2: 28 mmol/L (ref 22–32)
Calcium: 8.8 mg/dL — ABNORMAL LOW (ref 8.9–10.3)
Chloride: 107 mmol/L (ref 98–111)
Creatinine, Ser: 0.97 mg/dL (ref 0.61–1.24)
GFR, Estimated: >60 mL/min (ref >60)
Glucose, Bld: 127 mg/dL — ABNORMAL HIGH (ref 70–99)
Potassium: 4.2 mmol/L (ref 3.5–5.1)
Sodium: 140 mmol/L (ref 135–145)

## 2022-01-24 MED ORDER — ADULT MULTIVITAMIN W/MINERALS CH
1.0000 | ORAL_TABLET | Freq: Every day | ORAL | Status: DC
  Administered 2022-01-24 – 2022-01-25 (×2): 1 via ORAL
  Filled 2022-01-24 (×2): qty 1

## 2022-01-24 NOTE — Progress Notes (Signed)
Transition of Care (TOC) Screening Note ? ?Patient Details  ?Name: Nathaniel Curry ?Date of Birth: July 15, 1981 ? ?Transition of Care (TOC) CM/SW Contact:    ?Sherie Don, LCSW ?Phone Number: ?01/24/2022, 10:39 AM ? ?Transition of Care Department [facility]) has reviewed patient and no TOC needs have been identified at this time. We will continue to monitor patient advancement through interdisciplinary progression rounds. If new patient transition needs arise, please place a TOC consult. ?

## 2022-01-24 NOTE — Plan of Care (Signed)
°  Problem: Activity: °Goal: Risk for activity intolerance will decrease °Outcome: Progressing °  °Problem: Elimination: °Goal: Will not experience complications related to urinary retention °Outcome: Progressing °  °Problem: Pain Managment: °Goal: General experience of comfort will improve °Outcome: Progressing °  °Problem: Safety: °Goal: Ability to remain free from injury will improve °Outcome: Progressing °  °

## 2022-01-24 NOTE — Progress Notes (Signed)
PT Cancellation Note ? ?Patient Details ?Name: Nathaniel Curry ?MRN: [id number] ?DOB: 09-14-1981 ? ? ?Cancelled Treatment:     PT order received but eval deferred.  Pt states he is IND with all mobility and RN verifies.  PT service with sign off. ? ? ?Xanthe Couillard ?01/24/2022, 10:05 AM ?

## 2022-01-24 NOTE — Progress Notes (Addendum)
1 Day Post-Op  ? ?Subjective/Chief Complaint: ?Feels ok ?Foley ok ?Pt on bedside bench ? No n/v ?No flatus. No bm ? ? ?Objective: ?Vital signs in last 24 hours: ?Temp:  [97.6 ?F (36.4 ?C)-98.8 ?F (37.1 ?C)] 98.8 ?F (37.1 ?C) (04/07 0543) ?Pulse Rate:  [49-86] 57 (04/07 0543) ?Resp:  [10-26] 14 (04/07 0543) ?BP: (103-169)/(58-102) 153/92 (04/07 0543) ?SpO2:  [94 %-100 %] 100 % (04/07 0543) ?FiO2 (%):  [2 %] 2 % (04/06 2026) ?Weight:  [138.3 kg] 138.3 kg (04/06 1737) ?Last BM Date : 01/23/22 ? ?Intake/Output from previous day: ?04/06 0701 - 04/07 0700 ?In: 3121.7 [P.O.:600; I.V.:2421.7; IV Piggyback:100] ?Out: 2000 [OZYYQ:8250; Blood:50] ?Intake/Output this shift: ?No intake/output data recorded. ? ?Alert, nontoxic ?Nonlabored ?Incisions ok - some bruising, honeycomb dressing ok- expected TTP at that site ? ?Lab Results:  ?Recent Labs  ?  01/24/22 ?0404  ?WBC 15.7*  ?HGB 12.0*  ?HCT 37.6*  ?PLT 303  ? ?BMET ?Recent Labs  ?  01/24/22 ?0404  ?NA 140  ?K 4.2  ?CL 107  ?CO2 28  ?GLUCOSE 127*  ?BUN 8  ?CREATININE 0.97  ?CALCIUM 8.8*  ? ?PT/INR ?No results for input(s): LABPROT, INR in the last 72 hours. ?ABG ?No results for input(s): PHART, HCO3 in the last 72 hours. ? ?Invalid input(s): PCO2, PO2 ? ?Studies/Results: ?No results found. ? ?Anti-infectives: ?Anti-infectives (From admission, onward)  ? ? Start     Dose/Rate Route Frequency Ordered Stop  ? 01/23/22 1130  cefoTEtan (CEFOTAN) 2 g in sodium chloride 0.9 % 100 mL IVPB       ? 2 g ?200 mL/hr over 30 Minutes Intravenous On call to O.R. 01/23/22 1116 01/23/22 1254  ? ?  ? ? ?Assessment/Plan: ?s/p Procedure(s): ?XI ROBOT ASSISTED SIGMOIDECTOMY, BILATERAL TAP BLOCK (N/A) ?XI ROBOTIC ASSISTED LOWER ANTERIOR RESECTION (N/A) ?FLEXIBLE SIGMOIDOSCOPY, INTRAOPERATIVE ASSESSMENT OF PERFUSION ICG (N/A) ?CYSTOSCOPY with FIREFLY INJECTION (N/A) ? ?Doing well ?Cont FLD until more bowel function ?Cont chemical vte prophylaxis ?Ambulate, pulm toilet ?Will add MVI given gastric  bypass history ? ?[name]. [name], MD, FACS ?General, Bariatric, & Minimally Invasive Surgery ?[facility] ? ? LOS: 1 day  ? ? ?Greer Pickerel ?01/24/2022 ? ?

## 2022-01-25 LAB — CBC
HCT: 37.5 % — ABNORMAL LOW (ref 39.0–52.0)
Hemoglobin: 11.7 g/dL — ABNORMAL LOW (ref 13.0–17.0)
MCH: 26.2 pg (ref 26.0–34.0)
MCHC: 31.2 g/dL (ref 30.0–36.0)
MCV: 84.1 fL (ref 80.0–100.0)
Platelets: 314 10*3/uL (ref 150–400)
RBC: 4.46 MIL/uL (ref 4.22–5.81)
RDW: 13.8 % (ref 11.5–15.5)
WBC: 13.1 10*3/uL — ABNORMAL HIGH (ref 4.0–10.5)
nRBC: 0 % (ref 0.0–0.2)

## 2022-01-25 LAB — BASIC METABOLIC PANEL
Anion gap: 7 (ref 5–15)
BUN: 7 mg/dL (ref 6–20)
CO2: 29 mmol/L (ref 22–32)
Calcium: 8.8 mg/dL — ABNORMAL LOW (ref 8.9–10.3)
Chloride: 104 mmol/L (ref 98–111)
Creatinine, Ser: 1.03 mg/dL (ref 0.61–1.24)
GFR, Estimated: >60 mL/min (ref >60)
Glucose, Bld: 98 mg/dL (ref 70–99)
Potassium: 3.6 mmol/L (ref 3.5–5.1)
Sodium: 140 mmol/L (ref 135–145)

## 2022-01-25 NOTE — Progress Notes (Signed)
2 Days Post-Op  ? ?Subjective/Chief Complaint: ?Feels ok ?Foley ok ?Pt on bedside bench ? Had a BM ?Had some nausea last pm and this am but it is resolved ?Pain ok ? ? ?Objective: ?Vital signs in last 24 hours: ?Temp:  [98.2 ?F (36.8 ?C)-99 ?F (37.2 ?C)] 98.6 ?F (37 ?C) (04/08 0615) ?Pulse Rate:  [61-75] 75 (04/08 0615) ?Resp:  [14-16] 14 (04/08 0615) ?BP: (110-134)/(54-78) 134/78 (04/08 0615) ?SpO2:  [92 %-98 %] 92 % (04/08 0615) ?Weight:  [144.9 kg] 144.9 kg (04/08 0500) ?Last BM Date : 01/24/22 ? ?Intake/Output from previous day: ?04/07 0701 - 04/08 0700 ?In: 2815.7 [P.O.:1020; I.V.:1795.7] ?Out: 1000 [Urine:1000] ?Intake/Output this shift: ?No intake/output data recorded. ? ?Alert, nontoxic ?Nonlabored ?reg ?Incisions ok - some bruising, honeycomb dressing ok- expected TTP at that site ? ?Lab Results:  ?Recent Labs  ?  01/24/22 ?0404 01/25/22 ?0501  ?WBC 15.7* 13.1*  ?HGB 12.0* 11.7*  ?HCT 37.6* 37.5*  ?PLT 303 314  ? ? ?BMET ?Recent Labs  ?  01/24/22 ?0404 01/25/22 ?0501  ?NA 140 140  ?K 4.2 3.6  ?CL 107 104  ?CO2 28 29  ?GLUCOSE 127* 98  ?BUN 8 7  ?CREATININE 0.97 1.03  ?CALCIUM 8.8* 8.8*  ? ? ?PT/INR ?No results for input(s): LABPROT, INR in the last 72 hours. ?ABG ?No results for input(s): PHART, HCO3 in the last 72 hours. ? ?Invalid input(s): PCO2, PO2 ? ?Studies/Results: ?No results found. ? ?Anti-infectives: ?Anti-infectives (From admission, onward)  ? ? Start     Dose/Rate Route Frequency Ordered Stop  ? 01/23/22 1130  cefoTEtan (CEFOTAN) 2 g in sodium chloride 0.9 % 100 mL IVPB       ? 2 g ?200 mL/hr over 30 Minutes Intravenous On call to O.R. 01/23/22 1116 01/23/22 1254  ? ?  ? ? ?Assessment/Plan: ?s/p Procedure(s): ?XI ROBOT ASSISTED SIGMOIDECTOMY, BILATERAL TAP BLOCK (N/A) ?XI ROBOTIC ASSISTED LOWER ANTERIOR RESECTION (N/A) ?FLEXIBLE SIGMOIDOSCOPY, INTRAOPERATIVE ASSESSMENT OF PERFUSION ICG (N/A) ?CYSTOSCOPY with FIREFLY INJECTION (N/A) ? ?Doing well ?Adv diet to soft ?Cont chemical vte  prophylaxis ?Ambulate, pulm toilet ?Will add MVI given gastric bypass history ? ?If does well this am and early PM, will discharge later today ?Discussed post discharge diet instructions and other instructions ? ?Nathaniel Curry. Nathaniel Pulling, MD, FACS ?General, Bariatric, & Minimally Invasive Surgery ?[facility] ? ? LOS: 2 days  ? ? ?Nathaniel Curry ?01/25/2022 ? ?

## 2022-01-26 NOTE — Discharge Summary (Signed)
Physician Discharge Summary  ?Nathaniel Curry VQQ:[id number] DOB: [birth year] DOA: 01/23/2022 ? ?PCP: Rusty Aus, MD ? ?Admit date: 01/23/2022 ?Discharge date: 01/25/2022 ? ?Recommendations for Outpatient Follow-up:  ? ? ? Follow-up Information   ? ? Ileana Roup, MD. Schedule an appointment as soon as possible for a visit in 3 week(s).   ?Specialties: General Surgery, Colon and Rectal Surgery ?Contact information: ?[address] ?[address] ?[place] [address] ?[phone] ? ? ?  ?  ? ?  ?  ? ?  ? 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 ?Surgical Procedure: by Dr white ?Robotic assisted low anterior resection ?Drainage of intra-abdominal abscess 3 x 3 cm ?Intraoperative assessment of perfusion using ICG fluorescence imaging ?Flexible sigmoidoscopy ?Bilateral transversus abdominus plane (TAP) blocks ?  ?(Cystoscopy/ureteral ICG by Dr. Junious Silk) ? ?Discharge Condition: Good ?Disposition: Home ? ?Diet recommendation: Soft ? ?Filed Weights  ? 01/17/22 1146 01/23/22 1737 01/25/22 0500  ?Weight: (!) 138.3 kg (!) 138.3 kg (!) 144.9 kg  ? ? ?History of present illness:  ?Patient had presented a few months ago with complicated diverticulitis and underwent medical management and ended up having a percutaneous drain placed.  He was brought to the operating room by Dr. Dema Severin for definitive management. ? ?Hospital Course:  ?Please see his operative note for additional details.  There was no evidence of bladder involvement or leak.  The patient was maintained on perioperative enhanced recovery protocol for colorectal surgery.  He was started on clear liquids immediately after surgery and rapid advance to a full liquid diet.  He is maintained on perioperative chemical VTE prophylaxis.  The patient recovered bowel function quickly.  He was tolerating a diet.  His pain was well controlled.  He was ambulating  without difficulty.  He was felt stable for discharge.  I discussed discharge instructions with the patient. ? ? ?Discharge Instructions ? ?Discharge Instructions   ? ? Call MD for:   Complete by: As directed ?  ? Temperature >101  ? Call MD for:  hives   Complete by: As directed ?  ? Call MD for:  persistant dizziness or light-headedness   Complete by: As directed ?  ? Call MD for:  persistant nausea and vomiting   Complete by: As directed ?  ? Call MD for:  redness, tenderness, or signs of infection (pain, swelling, redness, odor or green/yellow discharge around incision site)   Complete by: As directed ?  ? Call MD for:  severe uncontrolled pain   Complete by: As directed ?  ? Diet Carb Modified   Complete by: As directed ?  ? Discharge instructions   Complete by: As directed ?  ? See [facility] discharge instructions  ? Increase activity slowly   Complete by: As directed ?  ? ?  ? ?Allergies as of 01/25/2022   ?No Known Allergies ?  ? ?  ?Medication List  ?  ? ?TAKE these medications   ? ?ALPRAZolam 0.5 MG tablet ?Commonly known as: Duanne Moron ?Take 1 tablet (0.5 mg total) by mouth once daily as needed ?What changed:  ?how much to take ?how to take this ?when to take this ?reasons to take this ?  ?amlodipine-olmesartan 10-20 MG tablet ?Commonly known as: AZOR ?Take 1 tablet by mouth once daily ?  ?CALCIUM CITRATE PO ?Take 600 mg by mouth 3 (three) times daily. ?  ?colchicine 0.6 MG tablet ?Take  1 tablet (0.6 mg total) by mouth 2 (two) times daily as needed (gout) ?  ?indapamide 2.5 MG tablet ?Commonly known as: LOZOL ?Take 1 tablet (2.5 mg total) by mouth once daily ?  ?Linzess 72 MCG capsule ?Generic drug: linaclotide ?Take 1 capsule (72 mcg total) by mouth once daily ?What changed:  ?how much to take ?how to take this ?when to take this ?reasons to take this ?  ?metoCLOPramide 5 MG tablet ?Commonly known as: REGLAN ?Take 1 tablet (5 mg total) by mouth at bedtime for 90 days ?  ?metroNIDAZOLE 500 MG tablet ?Commonly known  as: FLAGYL ?Take 1 tablet (500 mg total) by mouth 3 (three) times daily. Take according to your procedure colon prep instructions ?  ?MULTIVITAMIN ADULT (MINERALS) PO ?Take 1 tablet by mouth daily. Bariatric with iron ?  ?pantoprazole 40 MG tablet ?Commonly known as: PROTONIX ?TAKE 1 TABLET BY MOUTH 2 (TWO) TIMES DAILY ?What changed:  ?how much to take ?when to take this ?reasons to take this ?  ?phentermine 37.5 MG capsule ?Take 1 capsule (37.5 mg total) by mouth every morning before breakfast for 30 days ?  ?sucralfate 1 g tablet ?Commonly known as: CARAFATE ?TAKE 1 TABLET BY MOUTH 4 (FOUR) TIMES DAILY BEFORE MEALS AND NIGHTLY ?What changed:  ?how much to take ?how to take this ?when to take this ?reasons to take this ?  ?traMADol 50 MG tablet ?Commonly known as: Ultram ?Take 1 tablet (50 mg total) by mouth every 6 (six) hours as needed for up to 5 days (postop pain not controlled with tylenol/ibuprofen). ?  ?venlafaxine XR 75 MG 24 hr capsule ?Commonly known as: EFFEXOR-XR ?Take 1 capsule (75 mg total) by mouth once daily ?What changed:  ?how much to take ?when to take this ?  ? ?  ? ? Follow-up Information   ? ? Ileana Roup, MD. Schedule an appointment as soon as possible for a visit in 3 week(s).   ?Specialties: General Surgery, Colon and Rectal Surgery ?Contact information: ?[address] ?[address] ?[place] [address] ?[phone] ? ? ?  ?  ? ?  ?  ? ?  ? ? ? ?The results of significant diagnostics from this hospitalization (including imaging, microbiology, ancillary and laboratory) are listed below for reference.   ? ?Significant Diagnostic Studies: ?No results found. ? ?Microbiology: ?Recent Results (from the past 240 hour(s))  ?Surgical PCR Screen     Status: None  ? Collection Time: 01/23/22 11:17 AM  ? Specimen: Nasal Mucosa; Nasal Swab  ?Result Value Ref Range Status  ? MRSA, PCR NEGATIVE NEGATIVE Final  ? Staphylococcus aureus NEGATIVE NEGATIVE Final  ?  Comment: (NOTE) ?The  Xpert SA Assay (FDA approved for NASAL specimens in patients 85 ?years of age and older), is one component of a comprehensive ?surveillance program. It is not intended to diagnose infection nor to ?guide or monitor treatment. ?Performed at [facility], [address] ?[place], [place] [address] ?  ?  ? ?Labs: ?Basic Metabolic Panel: ?Recent Labs  ?Lab 01/24/22 ?0404 01/25/22 ?0501  ?NA 140 140  ?K 4.2 3.6  ?CL 107 104  ?CO2 28 29  ?GLUCOSE 127* 98  ?BUN 8 7  ?CREATININE 0.97 1.03  ?CALCIUM 8.8* 8.8*  ? ?Liver Function Tests: ?No results for input(s): AST, ALT, ALKPHOS, BILITOT, PROT, ALBUMIN in the last 168 hours. ?No results for input(s): LIPASE, AMYLASE in the last 168 hours. ?No results for input(s): AMMONIA in the  last 168 hours. ?CBC: ?Recent Labs  ?Lab 01/24/22 ?0404 01/25/22 ?0501  ?WBC 15.7* 13.1*  ?HGB 12.0* 11.7*  ?HCT 37.6* 37.5*  ?MCV 83.2 84.1  ?PLT 303 314  ? ?Cardiac Enzymes: ?No results for input(s): CKTOTAL, CKMB, CKMBINDEX, TROPONINI in the last 168 hours. ?BNP: ?BNP (last 3 results) ?No results for input(s): BNP in the last 8760 hours. ? ?ProBNP (last 3 results) ?No results for input(s): PROBNP in the last 8760 hours. ? ?CBG: ?No results for input(s): GLUCAP in the last 168 hours. ? ?Principal Problem: ?  S/P laparoscopic-assisted sigmoidectomy ? ? ?Time coordinating discharge: 20 min ? ?Signed: ? ?Gayland Curry, MD FACS ?[facility] [place] ?[phone] ?01/26/2022, 9:32 AM ? ?

## 2022-01-27 LAB — SURGICAL PATHOLOGY

## 2022-03-03 ENCOUNTER — Other Ambulatory Visit: Payer: Self-pay

## 2022-03-03 MED ORDER — ALPRAZOLAM 0.5 MG PO TABS
ORAL_TABLET | ORAL | 5 refills | Status: DC
  Filled 2022-03-03: qty 30, 30d supply, fill #0
  Filled 2022-04-18: qty 30, 30d supply, fill #1
  Filled 2022-05-31: qty 30, 30d supply, fill #2
  Filled 2022-06-27 – 2022-07-03 (×2): qty 30, 30d supply, fill #3
  Filled 2022-07-25: qty 30, 30d supply, fill #4
  Filled ????-??-??: fill #4

## 2022-03-04 ENCOUNTER — Other Ambulatory Visit: Payer: Self-pay

## 2022-03-10 ENCOUNTER — Other Ambulatory Visit: Payer: Self-pay

## 2022-03-25 IMAGING — CT CT IMAGE GUIDED FLUID DRAIN BY CATHETER
1 of 3 series · 13 of 32 positions shown, 18 images · non-contrast
Comparison: none

INDICATION: Anterior pelvic diverticular abscess

[Series 2: i-spiral 5.0 bf37 · axial · 0.61mm/px · z∈[-19,+131]mm · 13 of 51 slices shown, 18 images]
[im 4/51  soft-tissue]
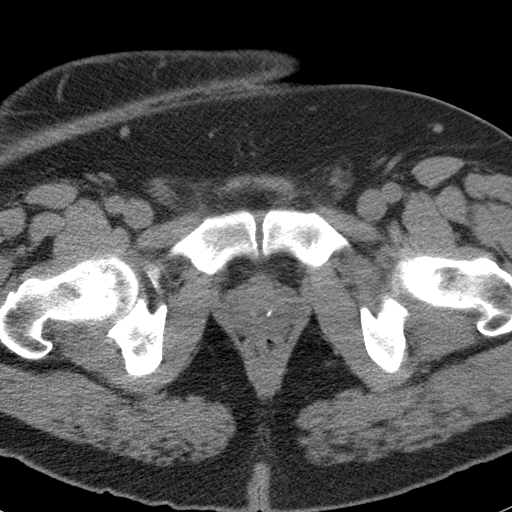
[im 4/51  bone]
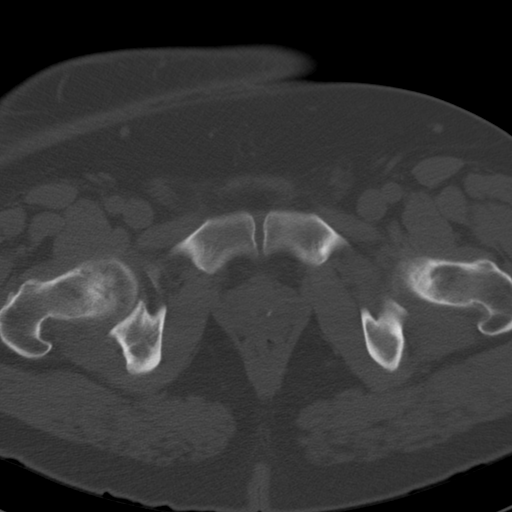
[im 7/51  soft-tissue]
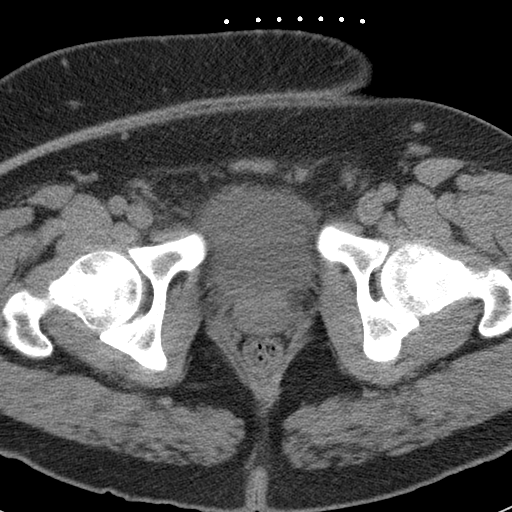
[im 13/51  soft-tissue]
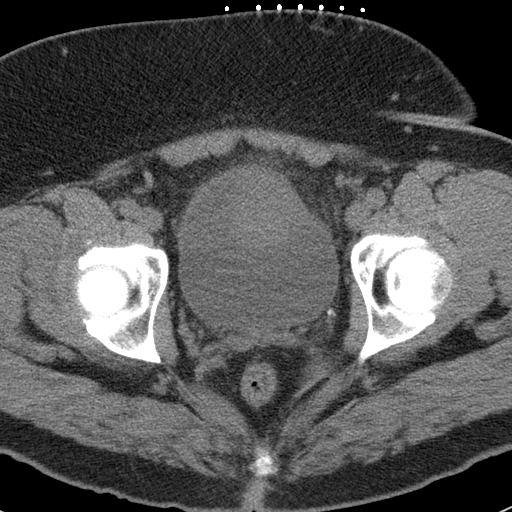
[im 16/51  soft-tissue]
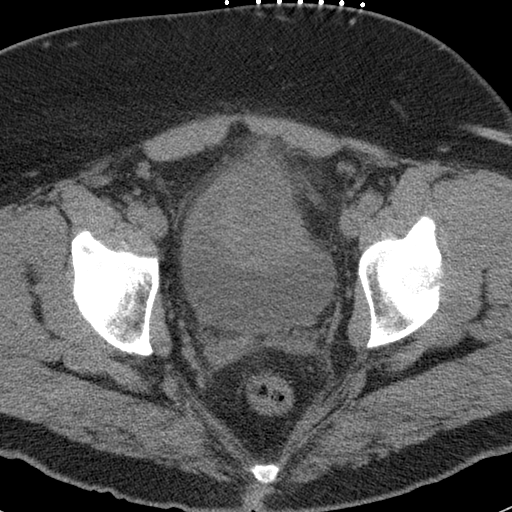
[im 19/51  soft-tissue]
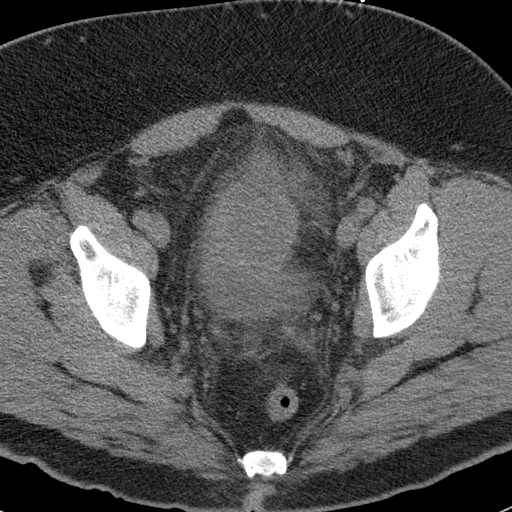
[im 22/51  soft-tissue]
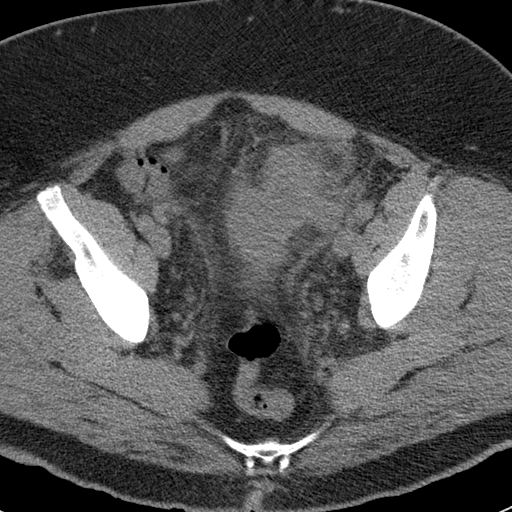
[im 29/51  soft-tissue]
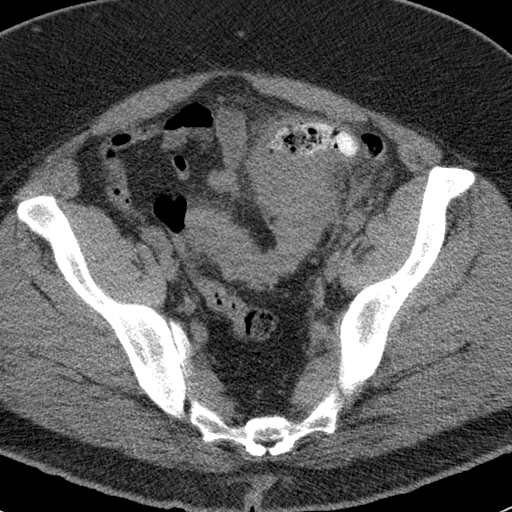
[im 32/51  soft-tissue]
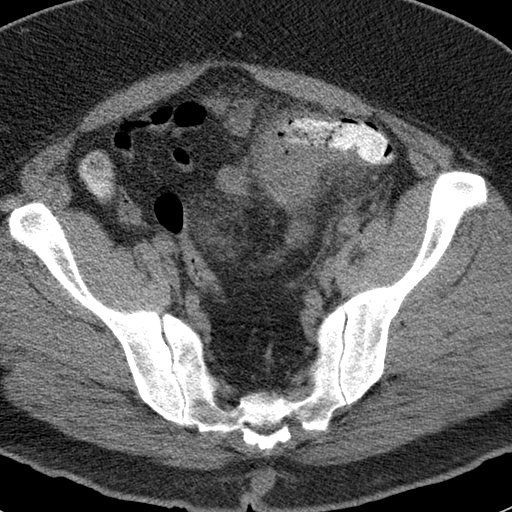
[im 35/51  soft-tissue]
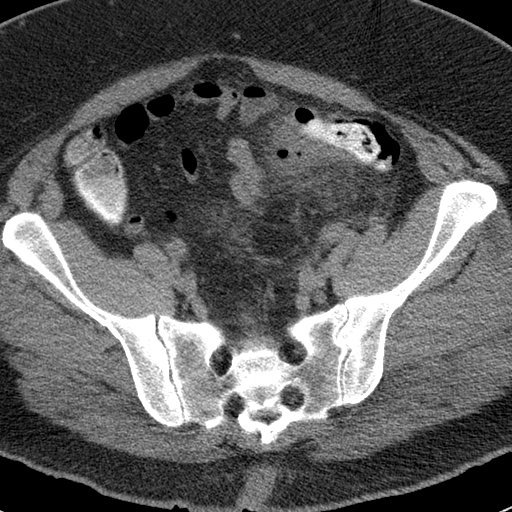
[im 35/51  bone]
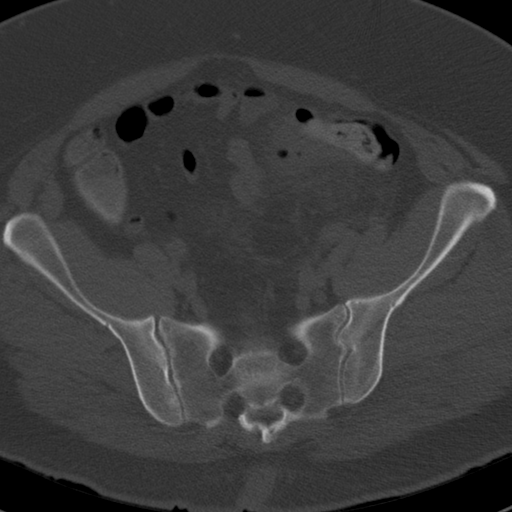
[im 38/51  soft-tissue]
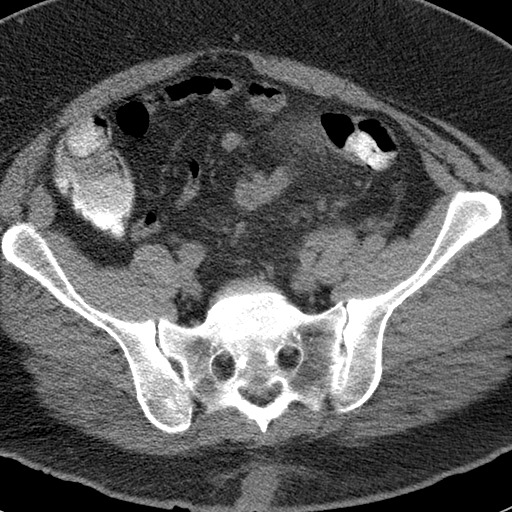
[im 38/51  lung]
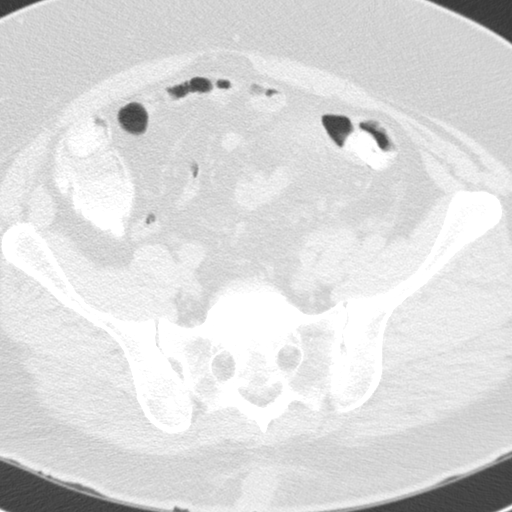
[im 41/51  lung]
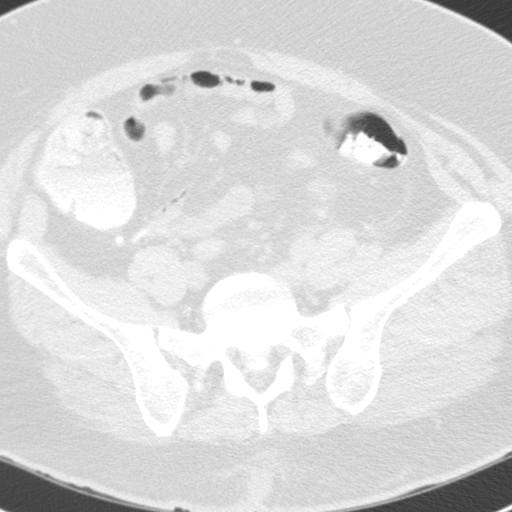
[im 44/51  soft-tissue]
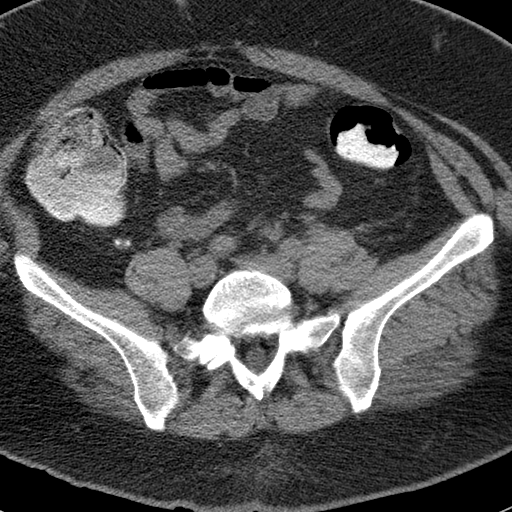
[im 44/51  lung]
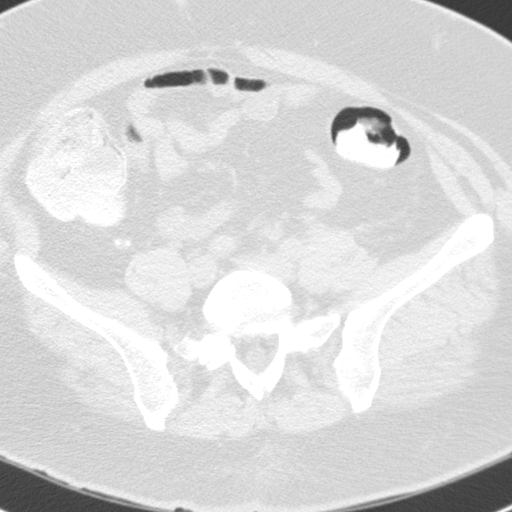
[im 47/51  soft-tissue]
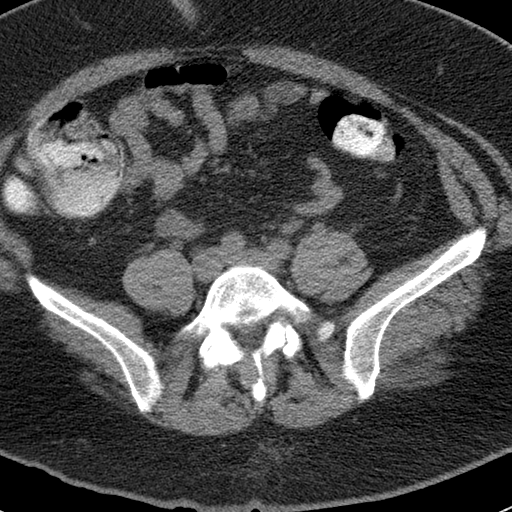
[im 47/51  lung]
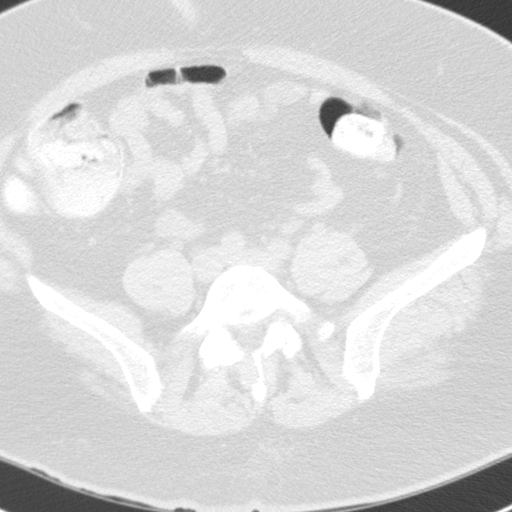

[13 of 32 positions shown; findings below may reference images not displayed]

EXAM:
CT DRAINAGE ANTERIOR PELVIC DIVERTICULAR ABSCESS

MEDICATIONS:
The patient is currently admitted to the hospital and receiving
intravenous antibiotics. The antibiotics were administered within an
appropriate time frame prior to the initiation of the procedure.

ANESTHESIA/SEDATION:
Moderate (conscious) sedation was employed during this procedure. A
total of Versed 4.0 mg and Fentanyl 200 mcg was administered
intravenously by the radiology nurse.

Total intra-service moderate Sedation Time: 20 minutes. The
patient's level of consciousness and vital signs were monitored
continuously by radiology nursing throughout the procedure under my
direct supervision.

COMPLICATIONS:
None immediate.

PROCEDURE:
Informed written consent was obtained from the patient after a
thorough discussion of the procedural risks, benefits and
alternatives. All questions were addressed. Maximal Sterile Barrier
Technique was utilized including caps, mask, sterile gowns, sterile
gloves, sterile drape, hand hygiene and skin antiseptic. A timeout
was performed prior to the initiation of the procedure.

previous imaging reviewed. patient positioned supine. noncontrast
localization ct performed. the intrapelvic abscess between the
sigmoid colon and bladder dome was localized and marked for a
midline approach.

Under sterile conditions and local anesthesia, an 18 gauge 15 cm
access needle was advanced from a anterior paramidline approach into
the abscess. Needle position confirmed with CT. Syringe aspiration
yielded purulent fluid. Guidewire inserted followed by tract
dilatation to insert a 10 French drain. Drain catheter position
confirmed with CT. Retention loop formed. Syringe aspiration yielded
30 cc purulent fluid. Sample sent for culture. Catheter secured with
Prolene suture and a sterile dressing. External suction bulb
applied. No immediate complication. Patient tolerated the procedure
well.
IMPRESSION: Successful CT-guided anterior pelvic diverticular abscess drain
placement

## 2022-04-18 ENCOUNTER — Other Ambulatory Visit: Payer: Self-pay

## 2022-05-01 ENCOUNTER — Encounter: Payer: Self-pay | Admitting: Oncology

## 2022-05-29 ENCOUNTER — Other Ambulatory Visit: Payer: Self-pay

## 2022-05-29 ENCOUNTER — Encounter: Payer: Self-pay | Admitting: Oncology

## 2022-05-29 DIAGNOSIS — Z8719 Personal history of other diseases of the digestive system: Secondary | ICD-10-CM | POA: Diagnosis not present

## 2022-05-29 DIAGNOSIS — I10 Essential (primary) hypertension: Secondary | ICD-10-CM | POA: Diagnosis not present

## 2022-05-29 DIAGNOSIS — Z9884 Bariatric surgery status: Secondary | ICD-10-CM | POA: Diagnosis not present

## 2022-05-29 DIAGNOSIS — Z9049 Acquired absence of other specified parts of digestive tract: Secondary | ICD-10-CM | POA: Diagnosis not present

## 2022-05-29 MED ORDER — WEGOVY 0.25 MG/0.5ML ~~LOC~~ SOAJ
SUBCUTANEOUS | 0 refills | Status: DC
  Filled 2022-05-29 – 2022-06-04 (×2): qty 2, 28d supply, fill #0

## 2022-05-31 ENCOUNTER — Other Ambulatory Visit: Payer: Self-pay

## 2022-06-01 ENCOUNTER — Other Ambulatory Visit: Payer: Self-pay

## 2022-06-02 ENCOUNTER — Other Ambulatory Visit: Payer: Self-pay

## 2022-06-03 ENCOUNTER — Other Ambulatory Visit: Payer: Self-pay

## 2022-06-04 ENCOUNTER — Other Ambulatory Visit: Payer: Self-pay

## 2022-06-05 ENCOUNTER — Other Ambulatory Visit: Payer: Self-pay

## 2022-06-05 MED ORDER — LINZESS 72 MCG PO CAPS
ORAL_CAPSULE | ORAL | 1 refills | Status: DC
  Filled 2022-06-05: qty 30, 30d supply, fill #0

## 2022-06-27 ENCOUNTER — Other Ambulatory Visit: Payer: Self-pay

## 2022-07-03 ENCOUNTER — Other Ambulatory Visit: Payer: Self-pay

## 2022-07-03 MED ORDER — WEGOVY 0.5 MG/0.5ML ~~LOC~~ SOAJ
SUBCUTANEOUS | 0 refills | Status: DC
  Filled 2022-07-03: qty 2, 28d supply, fill #0
  Filled 2022-07-04: qty 2, fill #0
  Filled 2022-07-04 – 2022-07-05 (×2): qty 2, 28d supply, fill #0

## 2022-07-04 ENCOUNTER — Other Ambulatory Visit: Payer: Self-pay

## 2022-07-06 ENCOUNTER — Other Ambulatory Visit: Payer: Self-pay

## 2022-07-07 ENCOUNTER — Other Ambulatory Visit: Payer: Self-pay

## 2022-07-23 DIAGNOSIS — E559 Vitamin D deficiency, unspecified: Secondary | ICD-10-CM | POA: Diagnosis not present

## 2022-07-23 DIAGNOSIS — Z9884 Bariatric surgery status: Secondary | ICD-10-CM | POA: Diagnosis not present

## 2022-07-23 DIAGNOSIS — E538 Deficiency of other specified B group vitamins: Secondary | ICD-10-CM | POA: Diagnosis not present

## 2022-07-23 DIAGNOSIS — Z0000 Encounter for general adult medical examination without abnormal findings: Secondary | ICD-10-CM | POA: Diagnosis not present

## 2022-07-24 ENCOUNTER — Other Ambulatory Visit: Payer: Self-pay

## 2022-07-24 MED ORDER — WEGOVY 1.7 MG/0.75ML ~~LOC~~ SOAJ
1.7000 mg | SUBCUTANEOUS | 0 refills | Status: DC
Start: 2022-07-24 — End: 2023-03-04
  Filled 2022-07-25 (×2): qty 3, 28d supply, fill #0
  Filled 2022-07-30: qty 3, fill #0
  Filled 2022-10-08: qty 3, 28d supply, fill #0

## 2022-07-25 ENCOUNTER — Other Ambulatory Visit: Payer: Self-pay

## 2022-07-27 ENCOUNTER — Other Ambulatory Visit: Payer: Self-pay

## 2022-07-28 ENCOUNTER — Other Ambulatory Visit: Payer: Self-pay

## 2022-07-29 ENCOUNTER — Other Ambulatory Visit: Payer: Self-pay

## 2022-07-30 ENCOUNTER — Other Ambulatory Visit: Payer: Self-pay

## 2022-07-30 DIAGNOSIS — Z0000 Encounter for general adult medical examination without abnormal findings: Secondary | ICD-10-CM | POA: Diagnosis not present

## 2022-07-30 DIAGNOSIS — E538 Deficiency of other specified B group vitamins: Secondary | ICD-10-CM | POA: Diagnosis not present

## 2022-07-30 DIAGNOSIS — R7989 Other specified abnormal findings of blood chemistry: Secondary | ICD-10-CM | POA: Diagnosis not present

## 2022-07-30 MED ORDER — PANTOPRAZOLE SODIUM 40 MG PO TBEC
40.0000 mg | DELAYED_RELEASE_TABLET | Freq: Every day | ORAL | 3 refills | Status: DC | PRN
  Filled 2022-07-30: qty 90, 90d supply, fill #0
  Filled 2022-10-08: qty 90, 90d supply, fill #1
  Filled 2023-05-20: qty 90, 90d supply, fill #2

## 2022-07-30 MED ORDER — ALPRAZOLAM 1 MG PO TABS
1.0000 mg | ORAL_TABLET | Freq: Every day | ORAL | 5 refills | Status: DC
  Filled 2022-07-30: qty 30, 30d supply, fill #0
  Filled 2022-08-31: qty 30, 30d supply, fill #1
  Filled 2022-09-28: qty 30, 30d supply, fill #2
  Filled 2022-10-21 – 2022-10-26 (×2): qty 30, 30d supply, fill #3

## 2022-07-30 MED ORDER — LINZESS 145 MCG PO CAPS
145.0000 ug | ORAL_CAPSULE | Freq: Every day | ORAL | 5 refills | Status: DC
  Filled 2022-07-30: qty 30, 30d supply, fill #0
  Filled 2022-08-31: qty 30, 30d supply, fill #1
  Filled 2022-09-28: qty 30, 30d supply, fill #2
  Filled 2022-10-21: qty 30, 30d supply, fill #3

## 2022-07-30 MED ORDER — CYANOCOBALAMIN 1000 MCG/ML IJ SOLN
INTRAMUSCULAR | 2 refills | Status: DC
  Filled 2022-07-30: qty 6, 84d supply, fill #0
  Filled 2022-10-08: qty 6, 84d supply, fill #1
  Filled 2023-01-11: qty 6, 84d supply, fill #2
  Filled 2023-05-20: qty 6, 84d supply, fill #3

## 2022-07-30 MED ORDER — VENLAFAXINE HCL ER 75 MG PO CP24
75.0000 mg | ORAL_CAPSULE | Freq: Every day | ORAL | 3 refills | Status: DC
  Filled 2022-07-30 – 2022-08-31 (×2): qty 90, 90d supply, fill #0

## 2022-07-30 MED ORDER — AMLODIPINE-OLMESARTAN 10-20 MG PO TABS
1.0000 | ORAL_TABLET | Freq: Every day | ORAL | 3 refills | Status: DC
  Filled 2022-07-30 – 2022-08-31 (×2): qty 90, 90d supply, fill #0
  Filled 2023-05-20: qty 90, 90d supply, fill #1

## 2022-07-30 MED ORDER — ERGOCALCIFEROL 1.25 MG (50000 UT) PO CAPS
50000.0000 [IU] | ORAL_CAPSULE | ORAL | 3 refills | Status: DC
  Filled 2022-07-30: qty 12, 84d supply, fill #0
  Filled 2022-10-08: qty 12, 84d supply, fill #1
  Filled 2023-01-11: qty 12, 84d supply, fill #2
  Filled 2023-05-20: qty 12, 84d supply, fill #3

## 2022-07-30 MED ORDER — INDAPAMIDE 2.5 MG PO TABS
2.5000 mg | ORAL_TABLET | Freq: Every day | ORAL | 3 refills | Status: DC
  Filled 2022-07-30 – 2022-08-31 (×2): qty 90, 90d supply, fill #0

## 2022-07-31 ENCOUNTER — Other Ambulatory Visit: Payer: Self-pay

## 2022-07-31 MED ORDER — WEGOVY 1 MG/0.5ML ~~LOC~~ SOAJ
SUBCUTANEOUS | 0 refills | Status: DC
  Filled 2022-07-31: qty 2, 28d supply, fill #0

## 2022-08-31 ENCOUNTER — Other Ambulatory Visit: Payer: Self-pay

## 2022-09-01 ENCOUNTER — Other Ambulatory Visit: Payer: Self-pay

## 2022-09-01 DIAGNOSIS — R5383 Other fatigue: Secondary | ICD-10-CM | POA: Diagnosis not present

## 2022-09-01 DIAGNOSIS — R7989 Other specified abnormal findings of blood chemistry: Secondary | ICD-10-CM | POA: Diagnosis not present

## 2022-09-01 DIAGNOSIS — E538 Deficiency of other specified B group vitamins: Secondary | ICD-10-CM | POA: Diagnosis not present

## 2022-09-01 MED ORDER — ZOLPIDEM TARTRATE ER 12.5 MG PO TBCR
12.5000 mg | EXTENDED_RELEASE_TABLET | Freq: Every evening | ORAL | 5 refills | Status: DC | PRN
  Filled 2022-09-01: qty 30, 30d supply, fill #0
  Filled 2022-09-28: qty 30, 30d supply, fill #1
  Filled 2022-10-21 – 2022-10-26 (×2): qty 30, 30d supply, fill #2

## 2022-09-02 ENCOUNTER — Other Ambulatory Visit: Payer: Self-pay

## 2022-09-04 ENCOUNTER — Other Ambulatory Visit: Payer: Self-pay

## 2022-09-04 MED ORDER — WEGOVY 1 MG/0.5ML ~~LOC~~ SOAJ
SUBCUTANEOUS | 0 refills | Status: DC
  Filled 2022-09-04: qty 2, 28d supply, fill #0

## 2022-09-04 MED ORDER — WEGOVY 0.5 MG/0.5ML ~~LOC~~ SOAJ
SUBCUTANEOUS | 0 refills | Status: DC
  Filled 2022-09-04 (×2): qty 2, 28d supply, fill #0

## 2022-09-16 ENCOUNTER — Other Ambulatory Visit: Payer: Self-pay

## 2022-09-29 ENCOUNTER — Other Ambulatory Visit: Payer: Self-pay

## 2022-09-29 ENCOUNTER — Encounter: Payer: Self-pay | Admitting: Oncology

## 2022-10-02 ENCOUNTER — Encounter: Payer: Self-pay | Admitting: Oncology

## 2022-10-04 ENCOUNTER — Encounter: Payer: Self-pay | Admitting: Oncology

## 2022-10-08 ENCOUNTER — Other Ambulatory Visit: Payer: Self-pay

## 2022-10-21 ENCOUNTER — Other Ambulatory Visit: Payer: Self-pay

## 2022-10-27 ENCOUNTER — Other Ambulatory Visit: Payer: Self-pay

## 2022-10-28 ENCOUNTER — Other Ambulatory Visit: Payer: Self-pay

## 2022-10-28 MED ORDER — VENLAFAXINE HCL ER 150 MG PO CP24
ORAL_CAPSULE | ORAL | 3 refills | Status: DC
  Filled 2022-10-28: qty 90, 90d supply, fill #0
  Filled 2023-02-07: qty 90, 90d supply, fill #1
  Filled 2023-05-20: qty 90, 90d supply, fill #2
  Filled 2023-09-07: qty 90, 90d supply, fill #3

## 2022-10-28 MED ORDER — ALPRAZOLAM 1 MG PO TABS
ORAL_TABLET | ORAL | 1 refills | Status: DC
  Filled 2022-10-30: qty 90, 90d supply, fill #0
  Filled 2022-11-03 – 2023-02-07 (×2): qty 90, fill #0
  Filled 2023-03-02: qty 90, 90d supply, fill #0

## 2022-10-28 MED ORDER — LINZESS 145 MCG PO CAPS
145.0000 ug | ORAL_CAPSULE | Freq: Every day | ORAL | 3 refills | Status: DC
  Filled 2022-10-28 – 2022-11-26 (×3): qty 90, 90d supply, fill #0

## 2022-10-30 ENCOUNTER — Other Ambulatory Visit: Payer: Self-pay

## 2022-11-03 ENCOUNTER — Other Ambulatory Visit: Payer: Self-pay

## 2022-11-26 ENCOUNTER — Other Ambulatory Visit: Payer: Self-pay

## 2022-11-26 MED ORDER — ZOLPIDEM TARTRATE ER 12.5 MG PO TBCR
12.5000 mg | EXTENDED_RELEASE_TABLET | Freq: Every evening | ORAL | 1 refills | Status: DC | PRN
  Filled 2022-11-26: qty 90, 90d supply, fill #0
  Filled 2023-05-19: qty 90, 90d supply, fill #1

## 2022-11-26 MED ORDER — ALPRAZOLAM 1 MG PO TABS
1.0000 mg | ORAL_TABLET | Freq: Every day | ORAL | 1 refills | Status: DC
  Filled 2022-11-26: qty 90, 90d supply, fill #0
  Filled 2023-05-08 – 2023-05-24 (×4): qty 90, 90d supply, fill #1

## 2022-11-27 ENCOUNTER — Other Ambulatory Visit: Payer: Self-pay

## 2022-11-28 ENCOUNTER — Other Ambulatory Visit: Payer: Self-pay

## 2022-11-28 ENCOUNTER — Encounter: Payer: Self-pay | Admitting: Oncology

## 2022-11-28 MED ORDER — WEGOVY 2.4 MG/0.75ML ~~LOC~~ SOAJ
0.7500 mL | SUBCUTANEOUS | 0 refills | Status: DC
  Filled 2022-11-28: qty 3, 28d supply, fill #0

## 2022-12-02 DIAGNOSIS — R790 Abnormal level of blood mineral: Secondary | ICD-10-CM | POA: Diagnosis not present

## 2022-12-02 DIAGNOSIS — K5900 Constipation, unspecified: Secondary | ICD-10-CM | POA: Diagnosis not present

## 2022-12-02 DIAGNOSIS — Z9884 Bariatric surgery status: Secondary | ICD-10-CM | POA: Diagnosis not present

## 2022-12-02 DIAGNOSIS — R7989 Other specified abnormal findings of blood chemistry: Secondary | ICD-10-CM | POA: Diagnosis not present

## 2022-12-02 DIAGNOSIS — E538 Deficiency of other specified B group vitamins: Secondary | ICD-10-CM | POA: Diagnosis not present

## 2022-12-12 ENCOUNTER — Other Ambulatory Visit: Payer: Self-pay

## 2022-12-12 MED ORDER — ZEPBOUND 5 MG/0.5ML ~~LOC~~ SOAJ
5.0000 mg | SUBCUTANEOUS | 0 refills | Status: DC
  Filled 2022-12-12 – 2022-12-17 (×4): qty 2, 28d supply, fill #0

## 2022-12-14 ENCOUNTER — Other Ambulatory Visit: Payer: Self-pay

## 2022-12-15 ENCOUNTER — Other Ambulatory Visit: Payer: Self-pay

## 2022-12-17 ENCOUNTER — Other Ambulatory Visit: Payer: Self-pay

## 2022-12-18 ENCOUNTER — Other Ambulatory Visit: Payer: Self-pay

## 2022-12-18 ENCOUNTER — Encounter: Payer: Self-pay | Admitting: Oncology

## 2022-12-18 MED ORDER — ZEPBOUND 7.5 MG/0.5ML ~~LOC~~ SOAJ
7.5000 mg | SUBCUTANEOUS | 0 refills | Status: DC
  Filled 2022-12-18: qty 2, 28d supply, fill #0

## 2023-01-09 ENCOUNTER — Other Ambulatory Visit: Payer: Self-pay

## 2023-01-09 MED ORDER — ZEPBOUND 12.5 MG/0.5ML ~~LOC~~ SOAJ
12.5000 mg | SUBCUTANEOUS | 0 refills | Status: DC
  Filled 2023-01-11 – 2023-02-07 (×3): qty 2, 28d supply, fill #0

## 2023-01-09 MED ORDER — ZEPBOUND 10 MG/0.5ML ~~LOC~~ SOAJ
10.0000 mg | SUBCUTANEOUS | 0 refills | Status: DC
  Filled 2023-01-09 – 2023-01-11 (×2): qty 2, 28d supply, fill #0

## 2023-01-11 ENCOUNTER — Other Ambulatory Visit: Payer: Self-pay

## 2023-01-12 ENCOUNTER — Other Ambulatory Visit: Payer: Self-pay

## 2023-01-13 ENCOUNTER — Other Ambulatory Visit: Payer: Self-pay

## 2023-01-13 MED ORDER — METOCLOPRAMIDE HCL 5 MG PO TABS
5.0000 mg | ORAL_TABLET | Freq: Every day | ORAL | 3 refills | Status: DC
  Filled 2023-01-13: qty 90, 90d supply, fill #0
  Filled 2023-05-20: qty 90, 90d supply, fill #1

## 2023-01-14 ENCOUNTER — Other Ambulatory Visit: Payer: Self-pay

## 2023-02-08 ENCOUNTER — Other Ambulatory Visit: Payer: Self-pay

## 2023-02-09 ENCOUNTER — Telehealth: Payer: Self-pay

## 2023-02-09 ENCOUNTER — Other Ambulatory Visit: Payer: Self-pay

## 2023-02-09 DIAGNOSIS — D509 Iron deficiency anemia, unspecified: Secondary | ICD-10-CM

## 2023-02-09 NOTE — Telephone Encounter (Signed)
Pt has not been seen since 2019. He would like to re-establish care. Please schedule pt for appts, he will see them on Mychart.  (just next avail- non urg)   Labs (cbc,iron,ferr) MD/ venofer 1-2  days after labs.

## 2023-02-12 ENCOUNTER — Encounter: Payer: Self-pay | Admitting: Oncology

## 2023-02-27 ENCOUNTER — Other Ambulatory Visit: Payer: Self-pay | Admitting: Oncology

## 2023-03-02 ENCOUNTER — Other Ambulatory Visit: Payer: Self-pay

## 2023-03-02 ENCOUNTER — Inpatient Hospital Stay: Payer: Commercial Managed Care - PPO | Attending: Oncology

## 2023-03-02 DIAGNOSIS — Z8719 Personal history of other diseases of the digestive system: Secondary | ICD-10-CM | POA: Diagnosis not present

## 2023-03-02 DIAGNOSIS — Z9884 Bariatric surgery status: Secondary | ICD-10-CM | POA: Diagnosis not present

## 2023-03-02 DIAGNOSIS — Z803 Family history of malignant neoplasm of breast: Secondary | ICD-10-CM | POA: Diagnosis not present

## 2023-03-02 DIAGNOSIS — D509 Iron deficiency anemia, unspecified: Secondary | ICD-10-CM

## 2023-03-02 DIAGNOSIS — Z800 Family history of malignant neoplasm of digestive organs: Secondary | ICD-10-CM | POA: Diagnosis not present

## 2023-03-02 LAB — CBC WITH DIFFERENTIAL (CANCER CENTER ONLY)
Abs Immature Granulocytes: 0.03 10*3/uL (ref 0.00–0.07)
Basophils Absolute: 0 10*3/uL (ref 0.0–0.1)
Basophils Relative: 1 %
Eosinophils Absolute: 0.3 10*3/uL (ref 0.0–0.5)
Eosinophils Relative: 3 %
HCT: 45.4 % (ref 39.0–52.0)
Hemoglobin: 14.1 g/dL (ref 13.0–17.0)
Immature Granulocytes: 0 %
Lymphocytes Relative: 28 %
Lymphs Abs: 2.5 10*3/uL (ref 0.7–4.0)
MCH: 25.6 pg — ABNORMAL LOW (ref 26.0–34.0)
MCHC: 31.1 g/dL (ref 30.0–36.0)
MCV: 82.4 fL (ref 80.0–100.0)
Monocytes Absolute: 0.6 10*3/uL (ref 0.1–1.0)
Monocytes Relative: 7 %
Neutro Abs: 5.4 10*3/uL (ref 1.7–7.7)
Neutrophils Relative %: 61 %
Platelet Count: 347 10*3/uL (ref 150–400)
RBC: 5.51 MIL/uL (ref 4.22–5.81)
RDW: 14 % (ref 11.5–15.5)
WBC Count: 8.9 10*3/uL (ref 4.0–10.5)
nRBC: 0 % (ref 0.0–0.2)

## 2023-03-02 LAB — IRON AND TIBC
Iron: 44 ug/dL — ABNORMAL LOW (ref 45–182)
Saturation Ratios: 8 % — ABNORMAL LOW (ref 17.9–39.5)
TIBC: 528 ug/dL — ABNORMAL HIGH (ref 250–450)
UIBC: 484 ug/dL

## 2023-03-02 LAB — FERRITIN: Ferritin: 7 ng/mL — ABNORMAL LOW (ref 24–336)

## 2023-03-04 ENCOUNTER — Inpatient Hospital Stay: Payer: Commercial Managed Care - PPO

## 2023-03-04 ENCOUNTER — Inpatient Hospital Stay (HOSPITAL_BASED_OUTPATIENT_CLINIC_OR_DEPARTMENT_OTHER): Payer: Commercial Managed Care - PPO | Admitting: Oncology

## 2023-03-04 ENCOUNTER — Encounter: Payer: Self-pay | Admitting: Oncology

## 2023-03-04 VITALS — BP 118/80 | HR 89 | Temp 97.2°F | Resp 18 | Wt 292.6 lb

## 2023-03-04 DIAGNOSIS — D508 Other iron deficiency anemias: Secondary | ICD-10-CM

## 2023-03-04 DIAGNOSIS — Z9884 Bariatric surgery status: Secondary | ICD-10-CM | POA: Diagnosis not present

## 2023-03-04 DIAGNOSIS — D509 Iron deficiency anemia, unspecified: Secondary | ICD-10-CM

## 2023-03-04 DIAGNOSIS — Z800 Family history of malignant neoplasm of digestive organs: Secondary | ICD-10-CM | POA: Diagnosis not present

## 2023-03-04 DIAGNOSIS — Z803 Family history of malignant neoplasm of breast: Secondary | ICD-10-CM | POA: Diagnosis not present

## 2023-03-04 DIAGNOSIS — Z8719 Personal history of other diseases of the digestive system: Secondary | ICD-10-CM | POA: Diagnosis not present

## 2023-03-04 MED ORDER — SODIUM CHLORIDE 0.9 % IV SOLN
200.0000 mg | Freq: Once | INTRAVENOUS | Status: AC
  Administered 2023-03-04: 200 mg via INTRAVENOUS
  Filled 2023-03-04: qty 200

## 2023-03-04 MED ORDER — SODIUM CHLORIDE 0.9 % IV SOLN
Freq: Once | INTRAVENOUS | Status: AC
  Filled 2023-03-04: qty 250

## 2023-03-04 NOTE — Progress Notes (Signed)
Hematology/Oncology Consult note [phone] [phone]      Patient Care Team: Danella Penton, MD as PCP - General (Internal Medicine) Rickard Patience, MD as Consulting Physician (Oncology)   REFERRING PROVIDER: Danella Penton, MD  CHIEF COMPLAINTS/REASON FOR VISIT:  Iron deficiency Anemia  ASSESSMENT & PLAN:  Iron deficiency anemia Labs are reviewed and discussed with patient. Lab Results  Component Value Date   HGB 14.1 03/02/2023   TIBC 528 (H) 03/02/2023   IRONPCTSAT 8 (L) 03/02/2023   FERRITIN 7 (L) 03/02/2023    Recommend IV venofer weekly x 4. Rationale and side effects were reviewed with patient.   No orders of the defined types were placed in this encounter.  Follow up PRN. Patient will call office to arrange future follow ups. Recommend patient to follow up with PCP  All questions were answered. The patient knows to call the clinic with any problems, questions or concerns.  Rickard Patience, MD, PhD [facility] Hematology Oncology 03/04/2023     HISTORY OF PRESENTING ILLNESS:  Nathaniel Curry is a  42 y.o.  male with PMH listed below who was referred to me for anemia Reviewed patient's recent labs that was done.  He was found to have abnormal CBC on 14.1, MCV 82, iron saturation 8, TIBC 528, ferritin 7  History of iron deficiency anemia, previously received Feraheme treatments.  Barrett's esophagitis and his recent GI work up listed as below:  EGD and Colonoscopy done in 2017 which revealed Barrett Esophagitis (with stigmata of bleeding/friability was found), and diverticulosis sigmoid and descending colon. 03/13/2016 Capsule study  10/19 2018 upper endoscopy in  which showed esophageal mucosal changes secondary to established short-segment Barrett's disease. Biopsied.- Gastritis and intact sleeve gastrectomy. Normal exam of duodenum Biopsy pathology showed gastroesophagitis with ulceration, negative for fungus, negative for goblet cells, dysplasia  and malignancy.  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 Tattooed. - Diverticulosis in the sigmoid colon. - Internal hemorrhoids. + fatigue He denies recent chest pain on exertion, shortness of breath on minimal exertion, pre-syncopal episodes, or palpitations He had not noticed any recent bleeding such as epistaxis, hematuria or hematochezia.  He denies over the counter NSAID ingestion.      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:s/p percutaneous drain   Family history of breast cancer    Family history of colon cancer 10/07/2018   Family history of melanoma    Family history of uterine cancer    GERD (gastroesophageal reflux disease)    Gout    History of hiatal hernia    History of kidney stones    Hypertension    Iron deficiency anemia 05/28/2017   MRSA (methicillin resistant Staphylococcus aureus) colonization 2011   neck surgery   Nephrolithiasis 02/08/2014   Obesity     SURGICAL HISTORY: Past Surgical History:  Procedure Laterality Date   BARIATRIC SURGERY     Gastric sleeve   CERVICAL FUSION     CHOLECYSTECTOMY     COLONOSCOPY WITH PROPOFOL N/A 01/11/2016   Procedure: COLONOSCOPY WITH PROPOFOL;  Surgeon: Christena Deem, MD;  Location[facility] ENDOSCOPY;  Service: Endoscopy;  Laterality: N/A;   COLONOSCOPY WITH PROPOFOL N/A 12/03/2021   Procedure: COLONOSCOPY WITH PROPOFOL;  Surgeon: Regis Bill, MD;  Location: [facility] ENDOSCOPY;  Service: Endoscopy;  Laterality: N/A;   ESOPHAGOGASTRODUODENOSCOPY  01/11/2016   Procedure: ESOPHAGOGASTRODUODENOSCOPY (EGD);  Surgeon: Christena Deem, MD;  Location: [facility] ENDOSCOPY;  Service: Endoscopy;;   ESOPHAGOGASTRODUODENOSCOPY (EGD) WITH PROPOFOL N/A 08/07/2017    Procedure: ESOPHAGOGASTRODUODENOSCOPY (EGD) WITH PROPOFOL;  Surgeon: Toledo, Boykin Nearing, MD;  Location: [facility] ENDOSCOPY;  Service: Gastroenterology;  Laterality: N/A;   ESOPHAGOGASTRODUODENOSCOPY (EGD) WITH PROPOFOL N/A 04/29/2019   Procedure: ESOPHAGOGASTRODUODENOSCOPY (EGD) WITH PROPOFOL;  Surgeon: Christena Deem, MD;  Location: [facility] ENDOSCOPY;  Service: Endoscopy;  Laterality: N/A;   FLEXIBLE SIGMOIDOSCOPY N/A 01/23/2022   Procedure: FLEXIBLE SIGMOIDOSCOPY, INTRAOPERATIVE ASSESSMENT OF PERFUSION ICG;  Surgeon: Andria Meuse, MD;  Location: [facility] ORS;  Service: General;  Laterality: N/A;   GASTRIC ROUX-EN-Y N/A 12/17/2020   Procedure: LAPAROSCOPIC ROUX-EN-Y GASTRIC BYPASS WITH UPPER ENDOSCOPY -CONVERSION FROM LAP SLEEVE GASTRECTOMY, Hiatal Hernia Repair;  Surgeon: Gaynelle Adu, MD;  Location: [facility] ORS;  Service: General;  Laterality: N/A;   HIATAL HERNIA REPAIR N/A 12/17/2020   Procedure: HERNIA REPAIR HIATAL;  Surgeon: Gaynelle Adu, MD;  Location: [facility] ORS;  Service: General;  Laterality: N/A;   IR CATHETER TUBE CHANGE  11/22/2021   IR RADIOLOGIST EVAL & MGMT  11/07/2021   IR RADIOLOGIST EVAL & MGMT  11/21/2021   LASIK     TONSILLECTOMY N/A 08/06/2016   Procedure: TONSILLECTOMY;  Surgeon: Geanie Logan, MD;  Location: [facility] ORS;  Service: ENT;  Laterality: N/A;   UPPER GASTROINTESTINAL ENDOSCOPY     XI ROBOTIC ASSISTED LOWER ANTERIOR RESECTION N/A 01/23/2022   Procedure: XI ROBOTIC ASSISTED LOWER ANTERIOR RESECTION;  Surgeon: Andria Meuse, MD;  Location: [facility] ORS;  Service: General;  Laterality: N/A;    SOCIAL HISTORY: Social History   Socioeconomic History   Marital status: Single    Spouse name: Not on file   Number of children: Not on file   Years of education: Not on file   Highest education level: Not on file  Occupational History    Employer: [employer]  Tobacco Use   Smoking status: Never   Smokeless tobacco: Never  Vaping Use   Vaping Use: Never used   Substance and Sexual Activity   Alcohol use: No    Alcohol/week: 0.0 standard drinks of alcohol   Drug use: No   Sexual activity: Never  Other Topics Concern   Not on file  Social History Narrative   Not on file   Social Determinants of Health   Financial Resource Strain: Not on file  Food Insecurity: Not on file  Transportation Needs: Not on file  Physical Activity: Not on file  Stress: Not on file  Social Connections: Not on file  Intimate Partner Violence: Not on file    FAMILY HISTORY: Family History  Problem Relation Age of Onset   Alcohol abuse Mother    Cancer Mother        unk Juleen China- was in appendix, peritoneal,, and colon   Hypertension Mother    Uterine cancer Mother 50   Hyperlipidemia Father    Hypertension Father    Diabetes Father    Colon cancer Father 8   Hypertension Maternal Grandmother    Alcohol abuse Maternal Grandmother    Breast cancer Maternal Grandmother        dx >50   Hypertension Maternal Grandfather    Alcohol abuse Maternal Grandfather    Melanoma Maternal Grandfather 41   Hypertension Paternal Grandmother  Kidney cancer Paternal Grandmother 69   Liver cancer Paternal Grandmother    Hypertension Paternal Grandfather     ALLERGIES:  has No Known Allergies.  MEDICATIONS:  Current Outpatient Medications  Medication Sig Dispense Refill   ALPRAZolam (XANAX) 1 MG tablet Take 1 tablet (1 mg total) by mouth at bedtime 90 tablet 1   ALPRAZolam (XANAX) 1 MG tablet Take 1 tablet (1 mg total) by mouth at bedtime. 90 tablet 1   amlodipine-olmesartan (AZOR) 10-20 MG tablet Take 1 tablet by mouth once daily 90 tablet 3   CALCIUM CITRATE PO Take 600 mg by mouth 3 (three) times daily.     colchicine 0.6 MG tablet Take 1 tablet (0.6 mg total) by mouth 2 (two) times daily as needed (gout) 60 tablet 1   cyanocobalamin (VITAMIN B12) 1000 MCG/ML injection Inject 1 mL (1,000 mcg total) into the muscle every 14 (fourteen) days 10 mL 2    ergocalciferol (VITAMIN D2) 1.25 MG (50000 UT) capsule Take 1 capsule (50,000 Units total) by mouth once a week. 12 capsule 3   indapamide (LOZOL) 2.5 MG tablet Take 1 tablet (2.5 mg total) by mouth daily. 90 tablet 3   linaclotide (LINZESS) 145 MCG CAPS capsule Take 1 capsule (145 mcg total) by mouth daily. 30 capsule 5   linaclotide (LINZESS) 145 MCG CAPS capsule Take 1 capsule (145 mcg total) by mouth daily. 90 capsule 3   metoCLOPramide (REGLAN) 5 MG tablet Take 1 tablet (5 mg total) by mouth at bedtime for 90 days 90 tablet 3   metoCLOPramide (REGLAN) 5 MG tablet Take 1 tablet (5 mg total) by mouth at bedtime 90 tablet 3   Multiple Vitamins-Minerals (MULTIVITAMIN ADULT, MINERALS, PO) Take 1 tablet by mouth daily. Bariatric with iron     pantoprazole (PROTONIX) 40 MG tablet Take 1 tablet (40 mg total) by mouth daily as needed. 180 tablet 3   tirzepatide (ZEPBOUND) 12.5 MG/0.5ML Pen Inject 12.5 mg into the skin once a week. 2 mL 0   venlafaxine XR (EFFEXOR-XR) 150 MG 24 hr capsule Take 1 capsule (150 mg total) by mouth once daily 90 capsule 3   zolpidem (AMBIEN CR) 12.5 MG CR tablet Take 1 tablet (12.5 mg total) by mouth at bedtime as needed for sleep. 90 tablet 1   sucralfate (CARAFATE) 1 g tablet TAKE 1 TABLET BY MOUTH 4 (FOUR) TIMES DAILY BEFORE MEALS AND NIGHTLY (Patient taking differently: Take 1 g by mouth 2 (two) times daily as needed (for acid reflux).) 120 tablet 11   No current facility-administered medications for this visit.    Review of Systems  Constitutional:  Positive for fatigue. Negative for appetite change, chills, fever and unexpected weight change.  HENT:   Negative for hearing loss and voice change.   Eyes:  Negative for eye problems and icterus.  Respiratory:  Negative for chest tightness, cough and shortness of breath.   Cardiovascular:  Negative for chest pain and leg swelling.  Gastrointestinal:  Negative for abdominal distention and abdominal pain.  Endocrine:  Negative for hot flashes.  Genitourinary:  Negative for difficulty urinating, dysuria and frequency.   Musculoskeletal:  Negative for arthralgias.  Skin:  Negative for itching and rash.  Neurological:  Negative for light-headedness and numbness.  Hematological:  Negative for adenopathy. Does not bruise/bleed easily.  Psychiatric/Behavioral:  Negative for confusion.     PHYSICAL EXAMINATION: Vitals:   03/04/23 1459  BP: 118/80  Pulse: 89  Resp: 18  Temp: (!) 97.2 F (  36.2 C)   Filed Weights   03/04/23 1459  Weight: 292 lb 9.6 oz (132.7 kg)    Physical Exam Constitutional:      General: He is not in acute distress. HENT:     Head: Normocephalic and atraumatic.  Eyes:     General: No scleral icterus. Cardiovascular:     Rate and Rhythm: Normal rate and regular rhythm.  Pulmonary:     Effort: Pulmonary effort is normal. No respiratory distress.     Breath sounds: No wheezing.  Abdominal:     General: Bowel sounds are normal. There is no distension.     Palpations: Abdomen is soft.  Musculoskeletal:        General: Normal range of motion.     Cervical back: Normal range of motion and neck supple.  Skin:    Findings: No erythema or rash.  Neurological:     Mental Status: He is alert and oriented to person, place, and time. Mental status is at baseline.     Cranial Nerves: No cranial nerve deficit.     Coordination: Coordination normal.  Psychiatric:        Mood and Affect: Mood normal.      LABORATORY DATA:  I have reviewed the data as listed    Latest Ref Rng & Units 03/02/2023    1:27 PM 01/25/2022    5:01 AM 01/24/2022    4:04 AM  CBC  WBC 4.0 - 10.5 K/uL 8.9  13.1  15.7   Hemoglobin 13.0 - 17.0 g/dL 16.1  09.6  04.5   Hematocrit 39.0 - 52.0 % 45.4  37.5  37.6   Platelets 150 - 400 K/uL 347  314  303       Latest Ref Rng & Units 01/25/2022    5:01 AM 01/24/2022    4:04 AM 01/13/2022    3:21 PM  CMP  Glucose 70 - 99 mg/dL 98  409  84   BUN 6 - 20 mg/dL 7  8   15    Creatinine 0.61 - 1.24 mg/dL 8.11  9.14  7.82   Sodium 135 - 145 mmol/L 140  140  138   Potassium 3.5 - 5.1 mmol/L 3.6  4.2  3.7   Chloride 98 - 111 mmol/L 104  107  105   CO2 22 - 32 mmol/L 29  28  24    Calcium 8.9 - 10.3 mg/dL 8.8  8.8  8.9    Lab Results  Component Value Date   IRON 44 (L) 03/02/2023   TIBC 528 (H) 03/02/2023   IRONPCTSAT 8 (L) 03/02/2023   FERRITIN 7 (L) 03/02/2023     RADIOGRAPHIC STUDIES: I have personally reviewed the radiological images as listed and agreed with the findings in the report. No results found.

## 2023-03-04 NOTE — Progress Notes (Signed)
Pt here to re establish care for anemia.

## 2023-03-04 NOTE — Assessment & Plan Note (Signed)
Labs are reviewed and discussed with patient. Lab Results  Component Value Date   HGB 14.1 03/02/2023   TIBC 528 (H) 03/02/2023   IRONPCTSAT 8 (L) 03/02/2023   FERRITIN 7 (L) 03/02/2023    Recommend IV venofer weekly x 4. Rationale and side effects were reviewed with patient.

## 2023-03-04 NOTE — Patient Instructions (Signed)

## 2023-03-12 ENCOUNTER — Inpatient Hospital Stay: Payer: Commercial Managed Care - PPO

## 2023-03-12 VITALS — BP 117/77 | HR 70 | Temp 97.8°F | Resp 18

## 2023-03-12 DIAGNOSIS — Z803 Family history of malignant neoplasm of breast: Secondary | ICD-10-CM | POA: Diagnosis not present

## 2023-03-12 DIAGNOSIS — Z800 Family history of malignant neoplasm of digestive organs: Secondary | ICD-10-CM | POA: Diagnosis not present

## 2023-03-12 DIAGNOSIS — D508 Other iron deficiency anemias: Secondary | ICD-10-CM

## 2023-03-12 DIAGNOSIS — Z9884 Bariatric surgery status: Secondary | ICD-10-CM | POA: Diagnosis not present

## 2023-03-12 DIAGNOSIS — D509 Iron deficiency anemia, unspecified: Secondary | ICD-10-CM | POA: Diagnosis not present

## 2023-03-12 DIAGNOSIS — Z8719 Personal history of other diseases of the digestive system: Secondary | ICD-10-CM | POA: Diagnosis not present

## 2023-03-12 MED ORDER — SODIUM CHLORIDE 0.9 % IV SOLN
Freq: Once | INTRAVENOUS | Status: AC
  Filled 2023-03-12: qty 250

## 2023-03-12 MED ORDER — SODIUM CHLORIDE 0.9 % IV SOLN
200.0000 mg | Freq: Once | INTRAVENOUS | Status: AC
  Administered 2023-03-12: 200 mg via INTRAVENOUS
  Filled 2023-03-12: qty 200

## 2023-03-13 ENCOUNTER — Other Ambulatory Visit: Payer: Commercial Managed Care - PPO

## 2023-03-18 ENCOUNTER — Ambulatory Visit: Payer: Commercial Managed Care - PPO

## 2023-03-18 ENCOUNTER — Other Ambulatory Visit: Payer: Commercial Managed Care - PPO

## 2023-03-18 ENCOUNTER — Ambulatory Visit: Payer: Commercial Managed Care - PPO | Admitting: Oncology

## 2023-03-18 MED FILL — Iron Sucrose Inj 20 MG/ML (Fe Equiv): INTRAVENOUS | Qty: 10 | Status: AC

## 2023-03-19 ENCOUNTER — Inpatient Hospital Stay: Payer: Commercial Managed Care - PPO

## 2023-03-19 ENCOUNTER — Ambulatory Visit: Payer: Commercial Managed Care - PPO

## 2023-03-26 ENCOUNTER — Inpatient Hospital Stay: Payer: Commercial Managed Care - PPO | Attending: Oncology

## 2023-03-26 VITALS — BP 131/94 | HR 75 | Temp 97.9°F | Resp 18

## 2023-03-26 DIAGNOSIS — Z9884 Bariatric surgery status: Secondary | ICD-10-CM | POA: Diagnosis not present

## 2023-03-26 DIAGNOSIS — D509 Iron deficiency anemia, unspecified: Secondary | ICD-10-CM | POA: Diagnosis not present

## 2023-03-26 DIAGNOSIS — Z8719 Personal history of other diseases of the digestive system: Secondary | ICD-10-CM | POA: Diagnosis not present

## 2023-03-26 DIAGNOSIS — Z803 Family history of malignant neoplasm of breast: Secondary | ICD-10-CM | POA: Insufficient documentation

## 2023-03-26 DIAGNOSIS — Z800 Family history of malignant neoplasm of digestive organs: Secondary | ICD-10-CM | POA: Insufficient documentation

## 2023-03-26 DIAGNOSIS — D508 Other iron deficiency anemias: Secondary | ICD-10-CM

## 2023-03-26 MED ORDER — SODIUM CHLORIDE 0.9 % IV SOLN
Freq: Once | INTRAVENOUS | Status: AC
  Filled 2023-03-26: qty 250

## 2023-03-26 MED ORDER — SODIUM CHLORIDE 0.9 % IV SOLN
200.0000 mg | Freq: Once | INTRAVENOUS | Status: AC
  Administered 2023-03-26: 200 mg via INTRAVENOUS
  Filled 2023-03-26: qty 200

## 2023-03-26 NOTE — Patient Instructions (Signed)

## 2023-04-02 ENCOUNTER — Telehealth: Payer: Self-pay

## 2023-04-02 ENCOUNTER — Inpatient Hospital Stay: Payer: Commercial Managed Care - PPO

## 2023-04-02 VITALS — BP 114/68 | HR 70 | Temp 97.9°F | Resp 16

## 2023-04-02 DIAGNOSIS — Z800 Family history of malignant neoplasm of digestive organs: Secondary | ICD-10-CM | POA: Diagnosis not present

## 2023-04-02 DIAGNOSIS — D509 Iron deficiency anemia, unspecified: Secondary | ICD-10-CM | POA: Diagnosis not present

## 2023-04-02 DIAGNOSIS — Z9884 Bariatric surgery status: Secondary | ICD-10-CM | POA: Diagnosis not present

## 2023-04-02 DIAGNOSIS — Z803 Family history of malignant neoplasm of breast: Secondary | ICD-10-CM | POA: Diagnosis not present

## 2023-04-02 DIAGNOSIS — Z8719 Personal history of other diseases of the digestive system: Secondary | ICD-10-CM | POA: Diagnosis not present

## 2023-04-02 DIAGNOSIS — D508 Other iron deficiency anemias: Secondary | ICD-10-CM

## 2023-04-02 MED ORDER — SODIUM CHLORIDE 0.9 % IV SOLN
200.0000 mg | Freq: Once | INTRAVENOUS | Status: AC
  Administered 2023-04-02: 200 mg via INTRAVENOUS
  Filled 2023-04-02: qty 200

## 2023-04-02 MED ORDER — SODIUM CHLORIDE 0.9 % IV SOLN
Freq: Once | INTRAVENOUS | Status: AC
  Filled 2023-04-02: qty 250

## 2023-04-02 NOTE — Progress Notes (Signed)
Pt tolerated treatment without complaints.  VSS.  Pt refused 30 minute post observation.  Pt understands risks.   

## 2023-04-02 NOTE — Telephone Encounter (Signed)
LM for pt to return my call

## 2023-04-02 NOTE — Patient Instructions (Signed)

## 2023-04-03 ENCOUNTER — Encounter: Payer: Self-pay | Admitting: Oncology

## 2023-04-03 ENCOUNTER — Encounter: Payer: Self-pay | Admitting: Family Medicine

## 2023-04-03 ENCOUNTER — Ambulatory Visit: Payer: 59 | Admitting: Family Medicine

## 2023-04-03 DIAGNOSIS — Z113 Encounter for screening for infections with a predominantly sexual mode of transmission: Secondary | ICD-10-CM

## 2023-04-03 NOTE — Progress Notes (Signed)
[facility] Department STI clinic/screening visit  Subjective:  Nathaniel Curry is a 42 y.o. male being seen today for an STI screening visit. The patient reports they do not have symptoms.    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   S/P gastric bypass (rev of sleeve to bypass) 12/18/2020   S/P laparoscopic sleeve gastrectomy 2014 12/17/2020   Encounter for general adult medical examination with abnormal findings 12/28/2018   Cervical radiculopathy 11/23/2018   Genetic testing 10/29/2018   Family history of colon cancer 10/07/2018   Family history of uterine cancer    Family history of melanoma    Family history of breast cancer    Pain of left heel 09/28/2018   Chronic headaches 06/17/2018   Anxiety and depression 06/17/2018   Severe obesity (BMI >= 40) (HCC) 10/28/2017   Tachycardia 06/01/2017   SOB (shortness of breath) on exertion 06/01/2017   Diaphoresis 06/01/2017   Iron deficiency anemia 05/28/2017   GERD (gastroesophageal reflux disease) 05/22/2017   Barrett esophagus 05/22/2017   Vitamin D deficiency 05/22/2017   Chronic fatigue 05/22/2017   History of gout 05/22/2017   Nephrolithiasis 02/08/2014   Essential (primary) hypertension 05/02/2013     Chief Complaint  Patient presents with   SEXUALLY TRANSMITTED DISEASE    Screening.  Syphilis testing    HPI  Patient reports to clinic after [facility]. Reports he had been to the plasma center once before that. Has not had sex in 20 years.   Last HIV test per patient/review of record was No results found for: "HMHIVSCREEN" No results found for: "HIV"  Does the patient or their partner desires a pregnancy in the next year? No  Screening for MPX risk: Does the patient have an unexplained rash? No Is the patient  MSM? No Does the patient endorse multiple sex partners or anonymous sex partners? No Did the patient have close or sexual contact with a person diagnosed with MPX? No Has the patient traveled outside the [place] where MPX is endemic? No Is there a high clinical suspicion for MPX-- evidenced by one of the following No  -Unlikely to be chickenpox  -Lymphadenopathy  -Rash that present in same phase of evolution on any given body part   See flowsheet for further details and programmatic requirements.   Immunization History  Administered Date(s) Administered   Influenza-Unspecified 07/10/2017, 07/16/2018     The following portions of the patient's history were reviewed and updated as appropriate: allergies, current medications, past medical history, past social history, past surgical history and problem list.  Objective:  There were no vitals filed for this visit.  Physical Exam Vitals and nursing note reviewed.  Constitutional:      Appearance: Normal appearance.  HENT:     Head: Normocephalic and atraumatic.     Mouth/Throat:     Mouth: Mucous membranes are moist.     Pharynx: No oropharyngeal exudate or posterior oropharyngeal erythema.  Eyes:     General:        Right eye: No discharge.        Left eye: No discharge.     Conjunctiva/sclera:     Right eye: Right conjunctiva is not injected. No exudate.    Left eye: Left conjunctiva is not injected. No exudate. Pulmonary:     Effort:  Pulmonary effort is normal.  Abdominal:     General: Abdomen is flat.     Palpations: Abdomen is soft. There is no hepatomegaly or mass.     Tenderness: There is no abdominal tenderness. There is no rebound.  Genitourinary:    Comments: Declined genital exam- asymptomatic Lymphadenopathy:     Cervical: No cervical adenopathy.     Upper Body:     Right upper body: No supraclavicular or axillary adenopathy.     Left upper body: No supraclavicular or axillary adenopathy.  Skin:    General: Skin  is warm and dry.  Neurological:     Mental Status: He is alert and oriented to person, place, and time.       Assessment and Plan:  Nathaniel Curry is a 42 y.o. male presenting to the [facility] Department for STI screening  1. Screening for venereal disease Repeat testing today for confirmation  - Syphilis Serology, [facility]   Patient does not have STI symptoms Patient accepted all screenings including blood work for Syphilis. Patient meets criteria for HepB screening? No. Ordered? not applicable Patient meets criteria for HepC screening? No. Ordered? not applicable Recommended condom use with all sex Discussed importance of condom use for STI prevent  Treat positive test results per standing order. Discussed time line for [facility] results and that patient will be called with positive results and encouraged patient to call if he had not heard in 2 weeks Recommended repeat testing in 3 months with positive results. Recommended returning for continued or worsening symptoms.   No future appointments. Total time spent 15 minutes Lenice Llamas, [place]

## 2023-04-06 DIAGNOSIS — H52223 Regular astigmatism, bilateral: Secondary | ICD-10-CM | POA: Diagnosis not present

## 2023-04-06 DIAGNOSIS — H04123 Dry eye syndrome of bilateral lacrimal glands: Secondary | ICD-10-CM | POA: Diagnosis not present

## 2023-04-10 ENCOUNTER — Encounter: Payer: Self-pay | Admitting: Family Medicine

## 2023-04-10 DIAGNOSIS — R768 Other specified abnormal immunological findings in serum: Secondary | ICD-10-CM | POA: Insufficient documentation

## 2023-05-11 ENCOUNTER — Other Ambulatory Visit: Payer: Self-pay

## 2023-05-12 ENCOUNTER — Other Ambulatory Visit: Payer: Self-pay

## 2023-05-20 ENCOUNTER — Other Ambulatory Visit: Payer: Self-pay

## 2023-05-21 ENCOUNTER — Other Ambulatory Visit: Payer: Self-pay

## 2023-05-22 ENCOUNTER — Other Ambulatory Visit: Payer: Self-pay

## 2023-05-25 ENCOUNTER — Other Ambulatory Visit: Payer: Self-pay

## 2023-05-29 ENCOUNTER — Other Ambulatory Visit: Payer: Self-pay

## 2023-05-31 ENCOUNTER — Other Ambulatory Visit: Payer: Self-pay

## 2023-06-01 ENCOUNTER — Other Ambulatory Visit: Payer: Self-pay

## 2023-06-01 MED ORDER — ALPRAZOLAM 1 MG PO TABS
1.0000 mg | ORAL_TABLET | Freq: Every day | ORAL | 0 refills | Status: DC
  Filled 2023-06-01: qty 90, 90d supply, fill #0

## 2023-07-23 ENCOUNTER — Encounter (HOSPITAL_COMMUNITY): Payer: Self-pay | Admitting: *Deleted

## 2023-07-30 DIAGNOSIS — E538 Deficiency of other specified B group vitamins: Secondary | ICD-10-CM | POA: Diagnosis not present

## 2023-07-30 DIAGNOSIS — Z0000 Encounter for general adult medical examination without abnormal findings: Secondary | ICD-10-CM | POA: Diagnosis not present

## 2023-07-31 ENCOUNTER — Encounter: Payer: Self-pay | Admitting: Oncology

## 2023-08-06 ENCOUNTER — Other Ambulatory Visit: Payer: Self-pay

## 2023-08-06 DIAGNOSIS — Z0000 Encounter for general adult medical examination without abnormal findings: Secondary | ICD-10-CM | POA: Diagnosis not present

## 2023-08-06 DIAGNOSIS — R7989 Other specified abnormal findings of blood chemistry: Secondary | ICD-10-CM | POA: Diagnosis not present

## 2023-08-06 DIAGNOSIS — D72820 Lymphocytosis (symptomatic): Secondary | ICD-10-CM | POA: Diagnosis not present

## 2023-08-06 MED ORDER — ALPRAZOLAM 1 MG PO TABS
1.0000 mg | ORAL_TABLET | Freq: Every day | ORAL | 1 refills | Status: DC
  Filled 2023-08-06 – 2023-09-07 (×2): qty 90, 90d supply, fill #0
  Filled 2023-12-17: qty 90, 90d supply, fill #1

## 2023-08-06 MED ORDER — PANTOPRAZOLE SODIUM 40 MG PO TBEC
40.0000 mg | DELAYED_RELEASE_TABLET | Freq: Every day | ORAL | 3 refills | Status: AC | PRN
  Filled 2023-08-06 – 2023-09-08 (×2): qty 90, 90d supply, fill #0
  Filled 2023-12-17: qty 90, 90d supply, fill #1
  Filled 2024-04-10: qty 90, 90d supply, fill #2

## 2023-08-06 MED ORDER — ZOLPIDEM TARTRATE ER 12.5 MG PO TBCR
12.5000 mg | EXTENDED_RELEASE_TABLET | Freq: Every evening | ORAL | 1 refills | Status: DC | PRN
  Filled 2023-08-06 – 2023-09-07 (×2): qty 90, 90d supply, fill #0

## 2023-08-06 MED ORDER — CYANOCOBALAMIN 1000 MCG/ML IJ SOLN
1000.0000 ug | INTRAMUSCULAR | 2 refills | Status: DC
  Filled 2023-08-06 – 2023-09-07 (×3): qty 6, 84d supply, fill #0
  Filled 2024-02-14: qty 6, 84d supply, fill #1
  Filled 2024-07-06: qty 6, 84d supply, fill #2

## 2023-08-06 MED ORDER — INDAPAMIDE 2.5 MG PO TABS
2.5000 mg | ORAL_TABLET | Freq: Every day | ORAL | 3 refills | Status: DC
  Filled 2023-08-06 – 2024-02-14 (×2): qty 90, 90d supply, fill #0
  Filled 2024-07-06: qty 90, 90d supply, fill #1

## 2023-08-06 MED ORDER — LINZESS 145 MCG PO CAPS
145.0000 ug | ORAL_CAPSULE | Freq: Every day | ORAL | 3 refills | Status: AC
  Filled 2023-08-06 – 2024-02-14 (×2): qty 90, 90d supply, fill #0

## 2023-08-06 MED ORDER — AMLODIPINE-OLMESARTAN 10-20 MG PO TABS
1.0000 | ORAL_TABLET | Freq: Every day | ORAL | 3 refills | Status: AC
  Filled 2023-08-06 – 2024-04-10 (×3): qty 90, 90d supply, fill #0

## 2023-08-06 MED ORDER — VENLAFAXINE HCL ER 150 MG PO CP24
150.0000 mg | ORAL_CAPSULE | Freq: Every day | ORAL | 3 refills | Status: DC
  Filled 2023-08-06 – 2023-12-17 (×3): qty 90, 90d supply, fill #0
  Filled 2024-04-10: qty 90, 90d supply, fill #1
  Filled 2024-07-06: qty 90, 90d supply, fill #2

## 2023-08-06 NOTE — Telephone Encounter (Signed)
Please schedule MD/ venofer next week. Pt will see appts on mychart.

## 2023-08-12 ENCOUNTER — Inpatient Hospital Stay: Payer: Commercial Managed Care - PPO | Attending: Oncology | Admitting: Oncology

## 2023-08-12 ENCOUNTER — Encounter: Payer: Self-pay | Admitting: Oncology

## 2023-08-12 ENCOUNTER — Inpatient Hospital Stay: Payer: Commercial Managed Care - PPO

## 2023-08-12 VITALS — BP 127/80 | HR 66 | Temp 97.4°F | Resp 18 | Wt 289.0 lb

## 2023-08-12 DIAGNOSIS — D508 Other iron deficiency anemias: Secondary | ICD-10-CM

## 2023-08-12 DIAGNOSIS — D509 Iron deficiency anemia, unspecified: Secondary | ICD-10-CM | POA: Insufficient documentation

## 2023-08-12 DIAGNOSIS — Z9884 Bariatric surgery status: Secondary | ICD-10-CM | POA: Diagnosis not present

## 2023-08-12 DIAGNOSIS — D72829 Elevated white blood cell count, unspecified: Secondary | ICD-10-CM | POA: Insufficient documentation

## 2023-08-12 MED ORDER — IRON SUCROSE 20 MG/ML IV SOLN
200.0000 mg | Freq: Once | INTRAVENOUS | Status: AC
  Administered 2023-08-12: 200 mg via INTRAVENOUS
  Filled 2023-08-12: qty 10

## 2023-08-12 NOTE — Assessment & Plan Note (Addendum)
Labs are reviewed and discussed with patient. Hemoglobin has improved to 16.7.  Ferritin is slightly decreased at 9.  Normal iron saturation. 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

## 2023-08-12 NOTE — Progress Notes (Signed)
Hematology/Oncology Progress note [phone] [phone]         Patient Care Team: Danella Penton, MD as PCP - General (Internal Medicine) Rickard Patience, MD as Consulting Physician (Oncology)   REFERRING PROVIDER: Danella Penton, MD  CHIEF COMPLAINTS/REASON FOR VISIT:  Iron deficiency Anemia  ASSESSMENT & PLAN:   Iron deficiency anemia Labs are reviewed and discussed with patient. Hemoglobin has improved to 16.7.  Ferritin is slightly decreased at 9.  Normal iron saturation. 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   Leukocytosis Chronic leukocytosis with neutrophilia.  Likely reactive Patient has had previous workup which includes negative peripheral blood flow cytometry, BCR-Abl 1 FISH negative, negative Jak 2 mutation with reflex.   Orders Placed This Encounter  Procedures   CBC with Differential (Cancer Center Only)    Standing Status:   Future    Standing Expiration Date:   08/11/2024   Ferritin    Standing Status:   Future    Standing Expiration Date:   08/11/2024   Retic Panel    Standing Status:   Future    Standing Expiration Date:   08/11/2024   Iron and TIBC    Standing Status:   Future    Standing Expiration Date:   08/11/2024   Follow up in 6 months. All questions were answered. The patient knows to call the clinic with any problems, questions or concerns.  Rickard Patience, MD, PhD [facility] Hematology Oncology 08/12/2023     HISTORY OF PRESENTING ILLNESS:  Nathaniel Curry is a  42 y.o.  male with PMH listed below who was referred to me for anemia Reviewed patient's recent labs that was done.  He was found to have abnormal CBC on 14.1, MCV 82, iron saturation 8, TIBC 528, ferritin 7  History of iron deficiency anemia, previously received Feraheme treatments.  Barrett's esophagitis and his recent GI work up listed  as below:  EGD and Colonoscopy done in 2017 which revealed Barrett Esophagitis (with stigmata of bleeding/friability was found), and diverticulosis sigmoid and descending colon. 03/13/2016 Capsule study  10/19 2018 upper endoscopy in  which showed esophageal mucosal changes secondary to established short-segment Barrett's disease. Biopsied.- Gastritis and intact sleeve gastrectomy. Normal exam of duodenum Biopsy pathology showed gastroesophagitis with ulceration, negative for fungus, negative for goblet cells, dysplasia and malignancy.  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 Tattooed. - Diverticulosis in the sigmoid colon. - Internal hemorrhoids. + fatigue He denies recent chest pain on exertion, shortness of breath on minimal exertion, pre-syncopal episodes, or palpitations He had not noticed any recent bleeding such as epistaxis, hematuria or hematochezia.  He denies over the counter NSAID ingestion.    INTERVAL HISTORY Nathaniel Curry is a 42 y.o. male who has above history reviewed by me today presents for follow up visit for iron deficiency anemia. Patient had a blood work done with primary care provider's office. Patient feels fatigued. + Increased irritability and worse of restless syndrome. Patient is on vitamin B12 injection every 2 weeks, managed by primary care provider.  Her last B12 level is slightly decreased at 250. No history of OSA, exogenous testosterone use.  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:s/p percutaneous drain   Family history of breast cancer    Family history of colon cancer 10/07/2018   Family history of melanoma    Family history of uterine cancer    GERD (gastroesophageal reflux disease)     Gout    History of hiatal hernia    History of kidney stones    Hypertension    Iron deficiency anemia 05/28/2017   MRSA (methicillin resistant Staphylococcus aureus) colonization 2011   neck surgery   Nephrolithiasis 02/08/2014   Obesity     SURGICAL HISTORY: Past Surgical History:  Procedure Laterality Date   BARIATRIC SURGERY     Gastric sleeve   CERVICAL FUSION     CHOLECYSTECTOMY     COLONOSCOPY WITH PROPOFOL N/A 01/11/2016   Procedure: COLONOSCOPY WITH PROPOFOL;  Surgeon: Christena Deem, MD;  Location: [facility] ENDOSCOPY;  Service: Endoscopy;  Laterality: N/A;   COLONOSCOPY WITH PROPOFOL N/A 12/03/2021   Procedure: COLONOSCOPY WITH PROPOFOL;  Surgeon: Regis Bill, MD;  Location: [facility] ENDOSCOPY;  Service: Endoscopy;  Laterality: N/A;   ESOPHAGOGASTRODUODENOSCOPY  01/11/2016   Procedure: ESOPHAGOGASTRODUODENOSCOPY (EGD);  Surgeon: Christena Deem, MD;  Location: [facility] ENDOSCOPY;  Service: Endoscopy;;   ESOPHAGOGASTRODUODENOSCOPY (EGD) WITH PROPOFOL N/A 08/07/2017   Procedure: ESOPHAGOGASTRODUODENOSCOPY (EGD) WITH PROPOFOL;  Surgeon: Toledo, Boykin Nearing, MD;  Location: [facility] ENDOSCOPY;  Service: Gastroenterology;  Laterality: N/A;   ESOPHAGOGASTRODUODENOSCOPY (EGD) WITH PROPOFOL N/A 04/29/2019   Procedure: ESOPHAGOGASTRODUODENOSCOPY (EGD) WITH PROPOFOL;  Surgeon: Christena Deem, MD;  Location: [facility] ENDOSCOPY;  Service: Endoscopy;  Laterality: N/A;   FLEXIBLE SIGMOIDOSCOPY N/A 01/23/2022   Procedure: FLEXIBLE SIGMOIDOSCOPY, INTRAOPERATIVE ASSESSMENT OF PERFUSION ICG;  Surgeon: Andria Meuse, MD;  Location: [facility] ORS;  Service: General;  Laterality: N/A;   GASTRIC ROUX-EN-Y N/A 12/17/2020   Procedure: LAPAROSCOPIC ROUX-EN-Y GASTRIC BYPASS WITH UPPER ENDOSCOPY -CONVERSION FROM LAP SLEEVE GASTRECTOMY, Hiatal Hernia Repair;  Surgeon: Gaynelle Adu, MD;  Location: [facility] ORS;  Service: General;  Laterality: N/A;   HIATAL HERNIA REPAIR N/A 12/17/2020   Procedure: HERNIA REPAIR HIATAL;   Surgeon: Gaynelle Adu, MD;  Location: [facility] ORS;  Service: General;  Laterality: N/A;   IR CATHETER TUBE CHANGE  11/22/2021   IR RADIOLOGIST EVAL & MGMT  11/07/2021   IR RADIOLOGIST EVAL & MGMT  11/21/2021   LASIK     TONSILLECTOMY N/A 08/06/2016   Procedure: TONSILLECTOMY;  Surgeon: Geanie Logan, MD;  Location: [facility] ORS;  Service: ENT;  Laterality: N/A;   UPPER GASTROINTESTINAL ENDOSCOPY     XI ROBOTIC ASSISTED LOWER ANTERIOR RESECTION N/A 01/23/2022   Procedure: XI ROBOTIC ASSISTED LOWER ANTERIOR RESECTION;  Surgeon: Andria Meuse, MD;  Location: [facility] ORS;  Service: General;  Laterality: N/A;    SOCIAL HISTORY: Social History   Socioeconomic History   Marital status: Single    Spouse name: Not on file   Number of children: Not on file   Years of education: Not on file   Highest education level: Not on file  Occupational History    Employer: [employer]  Tobacco Use   Smoking status: Never   Smokeless tobacco: Never  Vaping Use   Vaping status: Never Used  Substance and Sexual Activity   Alcohol use: No    Alcohol/week: 0.0 standard drinks of alcohol   Drug use: No   Sexual activity: Never  Other Topics Concern   Not on file  Social History Narrative  Not on file   Social Determinants of Health   Financial Resource Strain: Not on file  Food Insecurity: Not on file  Transportation Needs: Not on file  Physical Activity: Not on file  Stress: Not on file  Social Connections: Not on file  Intimate Partner Violence: Not on file    FAMILY HISTORY: Family History  Problem Relation Age of Onset   Alcohol abuse Mother    Cancer Mother        unk Juleen China- was in appendix, peritoneal,, and colon   Hypertension Mother    Uterine cancer Mother 71   Hyperlipidemia Father    Hypertension Father    Diabetes Father    Colon cancer Father 22   Hypertension Maternal Grandmother    Alcohol abuse Maternal Grandmother    Breast cancer Maternal Grandmother         dx >50   Hypertension Maternal Grandfather    Alcohol abuse Maternal Grandfather    Melanoma Maternal Grandfather 45   Hypertension Paternal Grandmother    Kidney cancer Paternal Grandmother 38   Liver cancer Paternal Grandmother    Hypertension Paternal Grandfather     ALLERGIES:  has No Known Allergies.  MEDICATIONS:  Current Outpatient Medications  Medication Sig Dispense Refill   ALPRAZolam (XANAX) 1 MG tablet Take 1 tablet (1 mg total) by mouth at bedtime. 90 tablet 1   ALPRAZolam (XANAX) 1 MG tablet Take 1 tablet (1 mg total) by mouth at bedtime 90 tablet 1   amlodipine-olmesartan (AZOR) 10-20 MG tablet Take 1 tablet by mouth once daily 90 tablet 3   CALCIUM CITRATE PO Take 600 mg by mouth 3 (three) times daily.     colchicine 0.6 MG tablet Take 1 tablet (0.6 mg total) by mouth 2 (two) times daily as needed (gout) 60 tablet 1   cyanocobalamin (VITAMIN B12) 1000 MCG/ML injection Inject 1 mL (1,000 mcg total) into the skin every 14 (fourteen) days. 10 mL 2   ergocalciferol (VITAMIN D2) 1.25 MG (50000 UT) capsule Take 1 capsule (50,000 Units total) by mouth once a week. 12 capsule 3   indapamide (LOZOL) 2.5 MG tablet Take 1 tablet (2.5 mg total) by mouth daily. 90 tablet 3   indapamide (LOZOL) 2.5 MG tablet Take 1 tablet (2.5 mg total) by mouth daily. 90 tablet 3   linaclotide (LINZESS) 145 MCG CAPS capsule Take 1 capsule (145 mcg total) by mouth daily. 90 capsule 3   linaclotide (LINZESS) 145 MCG CAPS capsule Take 1 capsule (145 mcg total) by mouth daily. 90 capsule 3   Multiple Vitamins-Minerals (MULTIVITAMIN ADULT, MINERALS, PO) Take 1 tablet by mouth daily. Bariatric with iron     pantoprazole (PROTONIX) 40 MG tablet Take 1 tablet (40 mg total) by mouth daily as needed. 180 tablet 3   venlafaxine XR (EFFEXOR-XR) 150 MG 24 hr capsule Take 1 capsule (150 mg total) by mouth once daily 90 capsule 3   venlafaxine XR (EFFEXOR-XR) 150 MG 24 hr capsule Take 1 capsule (150 mg total) by  mouth daily. 90 capsule 3   zolpidem (AMBIEN CR) 12.5 MG CR tablet Take 1 tablet (12.5 mg total) by mouth at bedtime as needed. 90 tablet 1   sucralfate (CARAFATE) 1 g tablet TAKE 1 TABLET BY MOUTH 4 (FOUR) TIMES DAILY BEFORE MEALS AND NIGHTLY (Patient taking differently: Take 1 g by mouth 2 (two) times daily as needed (for acid reflux).) 120 tablet 11   No current facility-administered medications for this visit.  Review of Systems  Constitutional:  Positive for fatigue. Negative for appetite change, chills, fever and unexpected weight change.  HENT:   Negative for hearing loss and voice change.   Eyes:  Negative for eye problems and icterus.  Respiratory:  Negative for chest tightness, cough and shortness of breath.   Cardiovascular:  Negative for chest pain and leg swelling.  Gastrointestinal:  Negative for abdominal distention and abdominal pain.  Endocrine: Negative for hot flashes.  Genitourinary:  Negative for difficulty urinating, dysuria and frequency.   Musculoskeletal:  Negative for arthralgias.  Skin:  Negative for itching and rash.  Neurological:  Negative for light-headedness and numbness.       Restless leg syndrome.   Hematological:  Negative for adenopathy. Does not bruise/bleed easily.  Psychiatric/Behavioral:  Negative for confusion.     PHYSICAL EXAMINATION: Vitals:   08/12/23 1437  BP: 127/80  Pulse: 66  Resp: 18  Temp: (!) 97.4 F (36.3 C)   Filed Weights   08/12/23 1437  Weight: 289 lb (131.1 kg)    Physical Exam Constitutional:      General: He is not in acute distress. HENT:     Head: Normocephalic and atraumatic.  Eyes:     General: No scleral icterus. Cardiovascular:     Rate and Rhythm: Normal rate and regular rhythm.  Pulmonary:     Effort: Pulmonary effort is normal. No respiratory distress.     Breath sounds: No wheezing.  Abdominal:     General: Bowel sounds are normal. There is no distension.     Palpations: Abdomen is soft.   Musculoskeletal:        General: Normal range of motion.     Cervical back: Normal range of motion and neck supple.  Skin:    Findings: No erythema or rash.  Neurological:     Mental Status: He is alert and oriented to person, place, and time. Mental status is at baseline.     Cranial Nerves: No cranial nerve deficit.     Coordination: Coordination normal.  Psychiatric:        Mood and Affect: Mood normal.      LABORATORY DATA:  I have reviewed the data as listed    Latest Ref Rng & Units 03/02/2023    1:27 PM 01/25/2022    5:01 AM 01/24/2022    4:04 AM  CBC  WBC 4.0 - 10.5 K/uL 8.9  13.1  15.7   Hemoglobin 13.0 - 17.0 g/dL 13.0  86.5  78.4   Hematocrit 39.0 - 52.0 % 45.4  37.5  37.6   Platelets 150 - 400 K/uL 347  314  303       Latest Ref Rng & Units 01/25/2022    5:01 AM 01/24/2022    4:04 AM 01/13/2022    3:21 PM  CMP  Glucose 70 - 99 mg/dL 98  696  84   BUN 6 - 20 mg/dL 7  8  15    Creatinine 0.61 - 1.24 mg/dL 2.95  2.84  1.32   Sodium 135 - 145 mmol/L 140  140  138   Potassium 3.5 - 5.1 mmol/L 3.6  4.2  3.7   Chloride 98 - 111 mmol/L 104  107  105   CO2 22 - 32 mmol/L 29  28  24    Calcium 8.9 - 10.3 mg/dL 8.8  8.8  8.9    Lab Results  Component Value Date   IRON 44 (L) 03/02/2023   TIBC  528 (H) 03/02/2023   IRONPCTSAT 8 (L) 03/02/2023   FERRITIN 7 (L) 03/02/2023     RADIOGRAPHIC STUDIES: I have personally reviewed the radiological images as listed and agreed with the findings in the report. No results found.

## 2023-08-12 NOTE — Assessment & Plan Note (Signed)
Chronic leukocytosis with neutrophilia.  Likely reactive Patient has had previous workup which includes negative peripheral blood flow cytometry, BCR-Abl 1 FISH negative, negative Jak 2 mutation with reflex.

## 2023-08-17 ENCOUNTER — Other Ambulatory Visit: Payer: Self-pay

## 2023-09-08 ENCOUNTER — Other Ambulatory Visit: Payer: Self-pay

## 2023-09-08 MED ORDER — BUPROPION HCL ER (XL) 150 MG PO TB24
150.0000 mg | ORAL_TABLET | Freq: Every day | ORAL | 11 refills | Status: DC
  Filled 2023-09-08: qty 30, 30d supply, fill #0

## 2023-09-08 MED ORDER — VENLAFAXINE HCL ER 75 MG PO CP24
75.0000 mg | ORAL_CAPSULE | Freq: Every day | ORAL | 11 refills | Status: DC
  Filled 2023-09-08: qty 30, 30d supply, fill #0

## 2023-09-09 ENCOUNTER — Other Ambulatory Visit: Payer: Self-pay

## 2023-09-10 DIAGNOSIS — D72820 Lymphocytosis (symptomatic): Secondary | ICD-10-CM | POA: Diagnosis not present

## 2023-09-30 DIAGNOSIS — R5383 Other fatigue: Secondary | ICD-10-CM | POA: Diagnosis not present

## 2023-10-09 ENCOUNTER — Other Ambulatory Visit: Payer: Self-pay

## 2023-10-09 ENCOUNTER — Telehealth: Payer: Self-pay

## 2023-10-09 MED ORDER — TAMSULOSIN HCL 0.4 MG PO CAPS
0.4000 mg | ORAL_CAPSULE | Freq: Every day | ORAL | 1 refills | Status: AC
Start: 2023-10-09 — End: ?
  Filled 2023-10-09: qty 30, 30d supply, fill #0
  Filled 2023-12-17: qty 30, 30d supply, fill #1

## 2023-10-09 NOTE — Telephone Encounter (Signed)
Patient called in, has seen SS in past for [facility] mgmt. Currently with Symptoms. Will send in Refill of Flomax per [facility], Patient made appt for follow-up.

## 2023-11-20 ENCOUNTER — Ambulatory Visit: Payer: Self-pay | Admitting: Urology

## 2023-12-17 ENCOUNTER — Other Ambulatory Visit: Payer: Self-pay

## 2024-02-05 ENCOUNTER — Inpatient Hospital Stay: Payer: Commercial Managed Care - PPO | Attending: Oncology

## 2024-02-05 DIAGNOSIS — Z9884 Bariatric surgery status: Secondary | ICD-10-CM | POA: Insufficient documentation

## 2024-02-05 DIAGNOSIS — R5383 Other fatigue: Secondary | ICD-10-CM | POA: Insufficient documentation

## 2024-02-05 DIAGNOSIS — K2270 Barrett's esophagus without dysplasia: Secondary | ICD-10-CM | POA: Diagnosis not present

## 2024-02-05 DIAGNOSIS — D509 Iron deficiency anemia, unspecified: Secondary | ICD-10-CM | POA: Insufficient documentation

## 2024-02-05 DIAGNOSIS — D508 Other iron deficiency anemias: Secondary | ICD-10-CM

## 2024-02-05 LAB — RETIC PANEL
Immature Retic Fract: 7.8 % (ref 2.3–15.9)
RBC.: 5.12 MIL/uL (ref 4.22–5.81)
Retic Count, Absolute: 63 10*3/uL (ref 19.0–186.0)
Retic Ct Pct: 1.2 % (ref 0.4–3.1)
Reticulocyte Hemoglobin: 28.8 pg (ref >27.9)

## 2024-02-05 LAB — CBC WITH DIFFERENTIAL (CANCER CENTER ONLY)
Abs Immature Granulocytes: 0.03 10*3/uL (ref 0.00–0.07)
Basophils Absolute: 0 10*3/uL (ref 0.0–0.1)
Basophils Relative: 1 %
Eosinophils Absolute: 0.5 10*3/uL (ref 0.0–0.5)
Eosinophils Relative: 5 %
HCT: 43.2 % (ref 39.0–52.0)
Hemoglobin: 13.9 g/dL (ref 13.0–17.0)
Immature Granulocytes: 0 %
Lymphocytes Relative: 32 %
Lymphs Abs: 2.8 10*3/uL (ref 0.7–4.0)
MCH: 27.3 pg (ref 26.0–34.0)
MCHC: 32.2 g/dL (ref 30.0–36.0)
MCV: 84.7 fL (ref 80.0–100.0)
Monocytes Absolute: 0.6 10*3/uL (ref 0.1–1.0)
Monocytes Relative: 7 %
Neutro Abs: 4.7 10*3/uL (ref 1.7–7.7)
Neutrophils Relative %: 55 %
Platelet Count: 312 10*3/uL (ref 150–400)
RBC: 5.1 MIL/uL (ref 4.22–5.81)
RDW: 13.2 % (ref 11.5–15.5)
WBC Count: 8.6 10*3/uL (ref 4.0–10.5)
nRBC: 0 % (ref 0.0–0.2)

## 2024-02-05 LAB — FERRITIN: Ferritin: 7 ng/mL — ABNORMAL LOW (ref 24–336)

## 2024-02-05 LAB — IRON AND TIBC
Iron: 48 ug/dL (ref 45–182)
Saturation Ratios: 11 % — ABNORMAL LOW (ref 17.9–39.5)
TIBC: 438 ug/dL (ref 250–450)
UIBC: 390 ug/dL

## 2024-02-05 LAB — FOLATE: Folate: 16 ng/mL (ref >5.9)

## 2024-02-05 LAB — VITAMIN B12: Vitamin B-12: 290 pg/mL (ref 180–914)

## 2024-02-10 ENCOUNTER — Inpatient Hospital Stay (HOSPITAL_BASED_OUTPATIENT_CLINIC_OR_DEPARTMENT_OTHER): Payer: Commercial Managed Care - PPO | Admitting: Oncology

## 2024-02-10 ENCOUNTER — Inpatient Hospital Stay: Payer: Commercial Managed Care - PPO

## 2024-02-10 ENCOUNTER — Encounter: Payer: Self-pay | Admitting: Oncology

## 2024-02-10 VITALS — BP 114/78 | HR 82 | Temp 98.3°F | Resp 19

## 2024-02-10 VITALS — BP 128/86 | HR 73 | Temp 97.7°F | Resp 18 | Wt 288.4 lb

## 2024-02-10 DIAGNOSIS — R5383 Other fatigue: Secondary | ICD-10-CM | POA: Diagnosis not present

## 2024-02-10 DIAGNOSIS — D508 Other iron deficiency anemias: Secondary | ICD-10-CM

## 2024-02-10 DIAGNOSIS — D509 Iron deficiency anemia, unspecified: Secondary | ICD-10-CM | POA: Diagnosis not present

## 2024-02-10 DIAGNOSIS — Z9884 Bariatric surgery status: Secondary | ICD-10-CM

## 2024-02-10 DIAGNOSIS — K2270 Barrett's esophagus without dysplasia: Secondary | ICD-10-CM | POA: Diagnosis not present

## 2024-02-10 MED ORDER — SODIUM CHLORIDE 0.9% FLUSH
10.0000 mL | Freq: Once | INTRAVENOUS | Status: AC | PRN
  Administered 2024-02-10: 10 mL
  Filled 2024-02-10: qty 10

## 2024-02-10 MED ORDER — IRON SUCROSE 20 MG/ML IV SOLN
200.0000 mg | Freq: Once | INTRAVENOUS | Status: AC
  Administered 2024-02-10: 200 mg via INTRAVENOUS

## 2024-02-10 NOTE — Assessment & Plan Note (Signed)
 B12 level is borderline.  Patient self administrates parental B12 1000mcg monthly, recommend to increase frequency to Q2 weeks.

## 2024-02-10 NOTE — Progress Notes (Signed)
 Hematology/Oncology Progress note [phone] [phone]         Patient Care Team: Sari Cunning, MD as PCP - General (Internal Medicine) Timmy Forbes, MD as Consulting Physician (Oncology)   REFERRING PROVIDER: Sari Cunning, MD  CHIEF COMPLAINTS/REASON FOR VISIT:  Iron  deficiency Anemia, history of gastric bypass  ASSESSMENT & PLAN:   Iron  deficiency anemia Labs are reviewed and discussed with patient. 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  Patient self administrates parental B12 1000mcg monthly, recommend to increase frequency to Q2 weeks.   No orders of the defined types were placed in this encounter.  Follow up in 6 months. All questions were answered. The patient knows to call the clinic with any problems, questions or concerns.  Timmy Forbes, MD, PhD [facility] Hematology Oncology 02/10/2024     HISTORY OF PRESENTING ILLNESS:  Nathaniel Curry is a  43 y.o.  male with PMH listed below who was referred to me for anemia Reviewed patient's recent labs that was done.  He was found to have abnormal CBC on 14.1, MCV 82, iron  saturation 8, TIBC 528, ferritin 7  History of iron  deficiency anemia, previously received Feraheme  treatments.  Barrett's esophagitis and his recent GI work up listed as below:  EGD and Colonoscopy done in 2017 which revealed Barrett Esophagitis (with stigmata of bleeding/friability was found), and diverticulosis sigmoid and descending colon. 03/13/2016 Capsule study  10/19 2018 upper endoscopy in  which showed esophageal mucosal changes secondary to established short-segment Barrett's disease.  Biopsied.- Gastritis and intact sleeve gastrectomy. Normal exam of duodenum Biopsy pathology showed gastroesophagitis with ulceration, negative for fungus, negative for goblet cells, dysplasia and malignancy.  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 Tattooed. - Diverticulosis in the sigmoid colon. - Internal hemorrhoids. + fatigue He denies recent chest pain on exertion, shortness of breath on minimal exertion, pre-syncopal episodes, or palpitations He had not noticed any recent bleeding such as epistaxis, hematuria or hematochezia.  He denies over the counter NSAID ingestion.  No history of OSA, exogenous testosterone  use.  INTERVAL HISTORY Nathaniel Curry is a 43 y.o. male who has above history reviewed by me today presents for follow up visit for iron  deficiency anemia. Patient feels fatigued. + Increased irritability and worse of restless syndrome. Patient is on vitamin B12 injection every 4 weeks, managed by primary care provider.     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:s/p percutaneous drain   Family history of breast cancer    Family history of colon cancer 10/07/2018   Family history of melanoma    Family history of uterine cancer    GERD (gastroesophageal reflux disease)    Gout    History of hiatal hernia    History of kidney stones    Hypertension    Iron  deficiency anemia 05/28/2017   MRSA (methicillin  resistant Staphylococcus aureus) colonization 2011   neck surgery   Nephrolithiasis 02/08/2014   Obesity     SURGICAL HISTORY: Past Surgical History:  Procedure Laterality Date   BARIATRIC SURGERY     Gastric sleeve   CERVICAL FUSION     CHOLECYSTECTOMY     COLONOSCOPY WITH PROPOFOL  N/A 01/11/2016    Procedure: COLONOSCOPY WITH PROPOFOL ;  Surgeon: Deveron Fly, MD;  Location: [facility] ENDOSCOPY;  Service: Endoscopy;  Laterality: N/A;   COLONOSCOPY WITH PROPOFOL  N/A 12/03/2021   Procedure: COLONOSCOPY WITH PROPOFOL ;  Surgeon: Shane Darling, MD;  Location: [facility] ENDOSCOPY;  Service: Endoscopy;  Laterality: N/A;   ESOPHAGOGASTRODUODENOSCOPY  01/11/2016   Procedure: ESOPHAGOGASTRODUODENOSCOPY (EGD);  Surgeon: Deveron Fly, MD;  Location: [facility] ENDOSCOPY;  Service: Endoscopy;;   ESOPHAGOGASTRODUODENOSCOPY (EGD) WITH PROPOFOL  N/A 08/07/2017   Procedure: ESOPHAGOGASTRODUODENOSCOPY (EGD) WITH PROPOFOL ;  Surgeon: Toledo, Alphonsus Jeans, MD;  Location: [facility] ENDOSCOPY;  Service: Gastroenterology;  Laterality: N/A;   ESOPHAGOGASTRODUODENOSCOPY (EGD) WITH PROPOFOL  N/A 04/29/2019   Procedure: ESOPHAGOGASTRODUODENOSCOPY (EGD) WITH PROPOFOL ;  Surgeon: Deveron Fly, MD;  Location: [facility] ENDOSCOPY;  Service: Endoscopy;  Laterality: N/A;   FLEXIBLE SIGMOIDOSCOPY N/A 01/23/2022   Procedure: FLEXIBLE SIGMOIDOSCOPY, INTRAOPERATIVE ASSESSMENT OF PERFUSION ICG;  Surgeon: Melvenia Stabs, MD;  Location: [facility] ORS;  Service: General;  Laterality: N/A;   GASTRIC ROUX-EN-Y N/A 12/17/2020   Procedure: LAPAROSCOPIC ROUX-EN-Y GASTRIC BYPASS WITH UPPER ENDOSCOPY -CONVERSION FROM LAP SLEEVE GASTRECTOMY, Hiatal Hernia Repair;  Surgeon: Aldean Hummingbird, MD;  Location: [facility] ORS;  Service: General;  Laterality: N/A;   HIATAL HERNIA REPAIR N/A 12/17/2020   Procedure: HERNIA REPAIR HIATAL;  Surgeon: Aldean Hummingbird, MD;  Location: [facility] ORS;  Service: General;  Laterality: N/A;   IR CATHETER TUBE CHANGE  11/22/2021   IR RADIOLOGIST EVAL & MGMT  11/07/2021   IR RADIOLOGIST EVAL & MGMT  11/21/2021   LASIK     TONSILLECTOMY N/A 08/06/2016   Procedure: TONSILLECTOMY;  Surgeon: Von Grumbling, MD;  Location: [facility] ORS;  Service: ENT;  Laterality: N/A;   UPPER GASTROINTESTINAL ENDOSCOPY     XI ROBOTIC ASSISTED LOWER ANTERIOR RESECTION N/A 01/23/2022    Procedure: XI ROBOTIC ASSISTED LOWER ANTERIOR RESECTION;  Surgeon: Melvenia Stabs, MD;  Location: [facility] ORS;  Service: General;  Laterality: N/A;    SOCIAL HISTORY: Social History   Socioeconomic History   Marital status: Single    Spouse name: Not on file   Number of children: Not on file   Years of education: Not on file   Highest education level: Not on file  Occupational History    Employer: [employer]  Tobacco Use   Smoking status: Never   Smokeless tobacco: Never  Vaping Use   Vaping status: Never Used  Substance and Sexual Activity   Alcohol use: No    Alcohol/week: 0.0 standard drinks of alcohol   Drug use: No   Sexual activity: Never  Other Topics Concern   Not on file  Social History Narrative   Not on file   Social Drivers of Health   Financial Resource Strain: Low Risk  (09/30/2023)   Received from [facility]   Overall Financial Resource Strain (CARDIA)    Difficulty of Paying Living Expenses: Not hard at all  Food Insecurity: No Food Insecurity (09/30/2023)   Received from [facility]   Hunger Vital Sign    Worried About Running Out of Food in the Last Year: Never true    Ran  Out of Food in the Last Year: Never true  Transportation Needs: No Transportation Needs (09/30/2023)   Received from [facility]    In the past 12 months, has lack of transportation kept you from medical appointments or from getting medications?: No    Lack of Transportation (Non-Medical): No  Physical Activity: Not on file  Stress: Not on file  Social Connections: Not on file  Intimate Partner Violence: Not At Risk (02/10/2024)   Humiliation, Afraid, Rape, and Kick questionnaire    Fear of Current or Ex-Partner: No    Emotionally Abused: No    Physically Abused: No    Sexually Abused: No    FAMILY HISTORY: Family History  Problem Relation Age of Onset   Alcohol abuse  Mother    Cancer Mother        unk primaray- was in appendix, peritoneal,, and colon   Hypertension Mother    Uterine cancer Mother 23   Hyperlipidemia Father    Hypertension Father    Diabetes Father    Colon cancer Father 51   Hypertension Maternal Grandmother    Alcohol abuse Maternal Grandmother    Breast cancer Maternal Grandmother        dx >50   Hypertension Maternal Grandfather    Alcohol abuse Maternal Grandfather    Melanoma Maternal Grandfather 32   Hypertension Paternal Grandmother    Kidney cancer Paternal Grandmother 21   Liver cancer Paternal Grandmother    Hypertension Paternal Grandfather     ALLERGIES:  has no known allergies.  MEDICATIONS:  Current Outpatient Medications  Medication Sig Dispense Refill   ALPRAZolam  (XANAX ) 1 MG tablet Take 1 tablet (1 mg total) by mouth at bedtime 90 tablet 1   amlodipine -olmesartan  (AZOR ) 10-20 MG tablet Take 1 tablet by mouth once daily 90 tablet 3   CALCIUM  CITRATE PO Take 600 mg by mouth 3 (three) times daily.     colchicine  0.6 MG tablet Take 1 tablet (0.6 mg total) by mouth 2 (two) times daily as needed (gout) 60 tablet 1   cyanocobalamin  (VITAMIN B12) 1000 MCG/ML injection Inject 1 mL (1,000 mcg total) into the skin every 14 (fourteen) days. 10 mL 2   ergocalciferol  (VITAMIN D2) 1.25 MG (50000 UT) capsule Take 1 capsule (50,000 Units total) by mouth once a week. 12 capsule 3   indapamide  (LOZOL ) 2.5 MG tablet Take 1 tablet (2.5 mg total) by mouth daily. 90 tablet 3   linaclotide  (LINZESS ) 145 MCG CAPS capsule Take 1 capsule (145 mcg total) by mouth daily. 90 capsule 3   Multiple Vitamins-Minerals (MULTIVITAMIN ADULT, MINERALS, PO) Take 1 tablet by mouth daily. Bariatric with iron      pantoprazole  (PROTONIX ) 40 MG tablet Take 1 tablet (40 mg total) by mouth daily as needed. 180 tablet 3   tamsulosin  (FLOMAX ) 0.4 MG CAPS capsule Take 1 capsule (0.4 mg total) by mouth daily. 30 capsule 1   venlafaxine  XR (EFFEXOR -XR) 150  MG 24 hr capsule Take 1 capsule (150 mg total) by mouth daily. 90 capsule 3   zolpidem  (AMBIEN  CR) 12.5 MG CR tablet Take 1 tablet (12.5 mg total) by mouth at bedtime as needed. 90 tablet 1   No current facility-administered medications for this visit.    Review of Systems  Constitutional:  Positive for fatigue. Negative for appetite change, chills, fever and unexpected weight change.  HENT:   Negative for hearing loss and voice change.   Eyes:  Negative for eye problems and icterus.  Respiratory:  Negative for chest tightness, cough and shortness of breath.   Cardiovascular:  Negative for chest pain and leg swelling.  Gastrointestinal:  Negative for abdominal distention and abdominal pain.  Endocrine: Negative for hot flashes.  Genitourinary:  Negative for difficulty urinating, dysuria and frequency.   Musculoskeletal:  Negative for arthralgias.  Skin:  Negative for itching and rash.  Neurological:  Negative for light-headedness and numbness.       Restless leg syndrome.   Hematological:  Negative for adenopathy. Does not bruise/bleed easily.  Psychiatric/Behavioral:  Negative for confusion.     PHYSICAL EXAMINATION: Vitals:   02/10/24 1415  BP: 128/86  Pulse: 73  Resp: 18  Temp: 97.7 F (36.5 C)   Filed Weights   02/10/24 1415  Weight: 288 lb 6.4 oz (130.8 kg)    Physical Exam Constitutional:      General: He is not in acute distress. HENT:     Head: Normocephalic and atraumatic.  Eyes:     General: No scleral icterus. Cardiovascular:     Rate and Rhythm: Normal rate and regular rhythm.  Pulmonary:     Effort: Pulmonary effort is normal. No respiratory distress.     Breath sounds: No wheezing.  Abdominal:     General: Bowel sounds are normal. There is no distension.     Palpations: Abdomen is soft.  Musculoskeletal:        General: Normal range of motion.     Cervical back: Normal range of motion and neck supple.  Skin:    Findings: No erythema or rash.   Neurological:     Mental Status: He is alert and oriented to person, place, and time. Mental status is at baseline.  Psychiatric:        Mood and Affect: Mood normal.      LABORATORY DATA:  I have reviewed the data as listed    Latest Ref Rng & Units 02/05/2024    2:13 PM 03/02/2023    1:27 PM 01/25/2022    5:01 AM  CBC  WBC 4.0 - 10.5 K/uL 8.6  8.9  13.1   Hemoglobin 13.0 - 17.0 g/dL 16.1  09.6  04.5   Hematocrit 39.0 - 52.0 % 43.2  45.4  37.5   Platelets 150 - 400 K/uL 312  347  314       Latest Ref Rng & Units 01/25/2022    5:01 AM 01/24/2022    4:04 AM 01/13/2022    3:21 PM  CMP  Glucose 70 - 99 mg/dL 98  409  84   BUN 6 - 20 mg/dL 7  8  15    Creatinine 0.61 - 1.24 mg/dL 8.11  9.14  7.82   Sodium 135 - 145 mmol/L 140  140  138   Potassium 3.5 - 5.1 mmol/L 3.6  4.2  3.7   Chloride 98 - 111 mmol/L 104  107  105   CO2 22 - 32 mmol/L 29  28  24    Calcium  8.9 - 10.3 mg/dL 8.8  8.8  8.9    Lab Results  Component Value Date   IRON  48 02/05/2024   TIBC 438 02/05/2024   IRONPCTSAT 11 (L) 02/05/2024   FERRITIN 7 (L) 02/05/2024     RADIOGRAPHIC STUDIES: I have personally reviewed the radiological images as listed and agreed with the findings in the report. No results found.

## 2024-02-10 NOTE — Assessment & Plan Note (Signed)
 Labs are reviewed and discussed with patient. 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

## 2024-02-14 ENCOUNTER — Other Ambulatory Visit: Payer: Self-pay

## 2024-02-15 ENCOUNTER — Other Ambulatory Visit: Payer: Self-pay

## 2024-02-15 MED ORDER — VITAMIN D (ERGOCALCIFEROL) 1.25 MG (50000 UNIT) PO CAPS
50000.0000 [IU] | ORAL_CAPSULE | ORAL | 3 refills | Status: AC
  Filled 2024-02-15 – 2024-10-01 (×2): qty 12, 84d supply, fill #0

## 2024-02-15 MED ORDER — ALPRAZOLAM 1 MG PO TABS
1.0000 mg | ORAL_TABLET | Freq: Every day | ORAL | 1 refills | Status: DC
  Filled 2024-04-10: qty 90, 90d supply, fill #0
  Filled 2024-07-06 – 2024-07-08 (×2): qty 90, 90d supply, fill #1

## 2024-02-17 ENCOUNTER — Inpatient Hospital Stay

## 2024-02-17 ENCOUNTER — Other Ambulatory Visit: Payer: Self-pay

## 2024-02-17 VITALS — BP 129/91 | HR 77 | Temp 95.9°F | Resp 16

## 2024-02-17 DIAGNOSIS — R5383 Other fatigue: Secondary | ICD-10-CM | POA: Diagnosis not present

## 2024-02-17 DIAGNOSIS — Z9884 Bariatric surgery status: Secondary | ICD-10-CM | POA: Diagnosis not present

## 2024-02-17 DIAGNOSIS — D508 Other iron deficiency anemias: Secondary | ICD-10-CM

## 2024-02-17 DIAGNOSIS — D509 Iron deficiency anemia, unspecified: Secondary | ICD-10-CM | POA: Diagnosis not present

## 2024-02-17 DIAGNOSIS — K2270 Barrett's esophagus without dysplasia: Secondary | ICD-10-CM | POA: Diagnosis not present

## 2024-02-17 MED ORDER — IRON SUCROSE 20 MG/ML IV SOLN
200.0000 mg | Freq: Once | INTRAVENOUS | Status: AC
  Administered 2024-02-17: 200 mg via INTRAVENOUS

## 2024-02-17 NOTE — Patient Instructions (Signed)
Iron Sucrose Injection What is this medication? IRON SUCROSE (EYE ern SOO krose) treats low levels of iron (iron deficiency anemia) in people with kidney disease. Iron is a mineral that plays an important role in making red blood cells, which carry oxygen from your lungs to the rest of your body. This medicine may be used for other purposes; ask your health care provider or pharmacist if you have questions. COMMON BRAND NAME(S): Venofer What should I tell my care team before I take this medication? They need to know if you have any of these conditions: Anemia not caused by low iron levels Heart disease High levels of iron in the blood Kidney disease Liver disease An unusual or allergic reaction to iron, other medications, foods, dyes, or preservatives Pregnant or trying to get pregnant Breastfeeding How should I use this medication? This medication is for infusion into a vein. It is given in a hospital or clinic setting. Talk to your care team about the use of this medication in children. While this medication may be prescribed for children as young as 2 years for selected conditions, precautions do apply. Overdosage: If you think you have taken too much of this medicine contact a poison control center or emergency room at once. NOTE: This medicine is only for you. Do not share this medicine with others. What if I miss a dose? Keep appointments for follow-up doses. It is important not to miss your dose. Call your care team if you are unable to keep an appointment. What may interact with this medication? Do not take this medication with any of the following: Deferoxamine Dimercaprol Other iron products This medication may also interact with the following: Chloramphenicol Deferasirox This list may not describe all possible interactions. Give your health care provider a list of all the medicines, herbs, non-prescription drugs, or dietary supplements you use. Also tell them if you smoke,  drink alcohol, or use illegal drugs. Some items may interact with your medicine. What should I watch for while using this medication? Visit your care team regularly. Tell your care team if your symptoms do not start to get better or if they get worse. You may need blood work done while you are taking this medication. You may need to follow a special diet. Talk to your care team. Foods that contain iron include: whole grains/cereals, dried fruits, beans, or peas, leafy green vegetables, and organ meats (liver, kidney). What side effects may I notice from receiving this medication? Side effects that you should report to your care team as soon as possible: Allergic reactions--skin rash, itching, hives, swelling of the face, lips, tongue, or throat Low blood pressure--dizziness, feeling faint or lightheaded, blurry vision Shortness of breath Side effects that usually do not require medical attention (report to your care team if they continue or are bothersome): Flushing Headache Joint pain Muscle pain Nausea Pain, redness, or irritation at injection site This list may not describe all possible side effects. Call your doctor for medical advice about side effects. You may report side effects to FDA at 1-800-FDA-1088. Where should I keep my medication? This medication is given in a hospital or clinic and will not be stored at home. NOTE: This sheet is a summary. It may not cover all possible information. If you have questions about this medicine, talk to your doctor, pharmacist, or health care provider.  2024 Elsevier/Gold Standard (2022-04-16 00:00:00)  

## 2024-02-23 ENCOUNTER — Telehealth: Payer: Self-pay

## 2024-02-23 NOTE — Telephone Encounter (Signed)
 [facility] received CD Report from [facility] Plasma: 02/18/24 specimen date Source = plasma Test = STS Result = Reactive Result Date 02/22/24

## 2024-02-24 ENCOUNTER — Inpatient Hospital Stay: Attending: Oncology

## 2024-02-24 VITALS — BP 134/84 | HR 73 | Resp 16

## 2024-02-24 DIAGNOSIS — D508 Other iron deficiency anemias: Secondary | ICD-10-CM

## 2024-02-24 DIAGNOSIS — D509 Iron deficiency anemia, unspecified: Secondary | ICD-10-CM | POA: Diagnosis not present

## 2024-02-24 MED ORDER — IRON SUCROSE 20 MG/ML IV SOLN
200.0000 mg | Freq: Once | INTRAVENOUS | Status: AC
  Administered 2024-02-24: 200 mg via INTRAVENOUS

## 2024-02-24 NOTE — Telephone Encounter (Addendum)
 Nathaniel Curry, DIS, assigned to case.  Hx: 04/03/23 [facility] RPR= Reactive Titer= 1:16 RPR= Confirmatory negative (Biological false positive. No tx indicated. - written by Nathaniel Glazier, FNP)  Nathaniel Curry stated she is going to call pt. Pt needs another RPR and confirmatory test. To confirm not a new infection.  --------  02/24/24 Nathaniel Curry left voicemail for pt and sent text message.

## 2024-02-24 NOTE — Patient Instructions (Signed)
Iron Sucrose Injection What is this medication? IRON SUCROSE (EYE ern SOO krose) treats low levels of iron (iron deficiency anemia) in people with kidney disease. Iron is a mineral that plays an important role in making red blood cells, which carry oxygen from your lungs to the rest of your body. This medicine may be used for other purposes; ask your health care provider or pharmacist if you have questions. COMMON BRAND NAME(S): Venofer What should I tell my care team before I take this medication? They need to know if you have any of these conditions: Anemia not caused by low iron levels Heart disease High levels of iron in the blood Kidney disease Liver disease An unusual or allergic reaction to iron, other medications, foods, dyes, or preservatives Pregnant or trying to get pregnant Breastfeeding How should I use this medication? This medication is for infusion into a vein. It is given in a hospital or clinic setting. Talk to your care team about the use of this medication in children. While this medication may be prescribed for children as young as 2 years for selected conditions, precautions do apply. Overdosage: If you think you have taken too much of this medicine contact a poison control center or emergency room at once. NOTE: This medicine is only for you. Do not share this medicine with others. What if I miss a dose? Keep appointments for follow-up doses. It is important not to miss your dose. Call your care team if you are unable to keep an appointment. What may interact with this medication? Do not take this medication with any of the following: Deferoxamine Dimercaprol Other iron products This medication may also interact with the following: Chloramphenicol Deferasirox This list may not describe all possible interactions. Give your health care provider a list of all the medicines, herbs, non-prescription drugs, or dietary supplements you use. Also tell them if you smoke,  drink alcohol, or use illegal drugs. Some items may interact with your medicine. What should I watch for while using this medication? Visit your care team regularly. Tell your care team if your symptoms do not start to get better or if they get worse. You may need blood work done while you are taking this medication. You may need to follow a special diet. Talk to your care team. Foods that contain iron include: whole grains/cereals, dried fruits, beans, or peas, leafy green vegetables, and organ meats (liver, kidney). What side effects may I notice from receiving this medication? Side effects that you should report to your care team as soon as possible: Allergic reactions--skin rash, itching, hives, swelling of the face, lips, tongue, or throat Low blood pressure--dizziness, feeling faint or lightheaded, blurry vision Shortness of breath Side effects that usually do not require medical attention (report to your care team if they continue or are bothersome): Flushing Headache Joint pain Muscle pain Nausea Pain, redness, or irritation at injection site This list may not describe all possible side effects. Call your doctor for medical advice about side effects. You may report side effects to FDA at 1-800-FDA-1088. Where should I keep my medication? This medication is given in a hospital or clinic and will not be stored at home. NOTE: This sheet is a summary. It may not cover all possible information. If you have questions about this medicine, talk to your doctor, pharmacist, or health care provider.  2024 Elsevier/Gold Standard (2022-04-16 00:00:00)  

## 2024-02-25 NOTE — Telephone Encounter (Signed)
 Phone call to pt at [phone], # on CD report.  Received a busy signal. Called twice. Unable to leave a message.

## 2024-02-26 NOTE — Telephone Encounter (Signed)
 Phone call to pt at [phone], # on CD report.  Received a busy signal when calling from [employer].  Unable to leave a message.  Called from CD Cell phone. Left message that RN with [facility] is calling re important TR. Please call Saulo Anthis at [phone].

## 2024-03-02 NOTE — Telephone Encounter (Signed)
 03/01/24 spoke with Suzy Esters, DIS. She received a voicemail message from pt. Nathaniel Curry to reach back out to pt.

## 2024-03-07 NOTE — Telephone Encounter (Signed)
 Spoke with Tiera Stackhouse, DIS. Visited his home address, 03/04/24. Not home on Friday; Left letter for him.

## 2024-03-09 NOTE — Telephone Encounter (Signed)
 [facility] RN has not received any response to phone calls.   -----------------------  Suzy Esters, DIS, received a voicemail from pt after hours, around 8:00 PM, one evening stating, basically, that he was aware of results and had the same thing happen before, no need to call him back.  Since then, Tiera performed another home visit and made calls. No response since then.  Closed by DIS.  ----------------------- (04/03/23 "biological false positive" with [facility])

## 2024-03-11 NOTE — Telephone Encounter (Signed)
 03/11/24 Mailed 1st notification letter re notice concerning an important health matter. See scanned copy.

## 2024-04-10 ENCOUNTER — Other Ambulatory Visit: Payer: Self-pay

## 2024-04-11 ENCOUNTER — Other Ambulatory Visit: Payer: Self-pay

## 2024-04-21 ENCOUNTER — Other Ambulatory Visit: Payer: Self-pay

## 2024-07-07 ENCOUNTER — Other Ambulatory Visit: Payer: Self-pay

## 2024-07-08 ENCOUNTER — Other Ambulatory Visit: Payer: Self-pay

## 2024-07-13 ENCOUNTER — Encounter (HOSPITAL_COMMUNITY): Payer: Self-pay | Admitting: *Deleted

## 2024-08-03 DIAGNOSIS — Z125 Encounter for screening for malignant neoplasm of prostate: Secondary | ICD-10-CM | POA: Diagnosis not present

## 2024-08-03 DIAGNOSIS — R7989 Other specified abnormal findings of blood chemistry: Secondary | ICD-10-CM | POA: Diagnosis not present

## 2024-08-03 DIAGNOSIS — Z0000 Encounter for general adult medical examination without abnormal findings: Secondary | ICD-10-CM | POA: Diagnosis not present

## 2024-08-10 ENCOUNTER — Other Ambulatory Visit: Payer: Self-pay

## 2024-08-10 DIAGNOSIS — R7989 Other specified abnormal findings of blood chemistry: Secondary | ICD-10-CM | POA: Diagnosis not present

## 2024-08-10 DIAGNOSIS — E538 Deficiency of other specified B group vitamins: Secondary | ICD-10-CM | POA: Diagnosis not present

## 2024-08-10 DIAGNOSIS — Z79899 Other long term (current) drug therapy: Secondary | ICD-10-CM | POA: Diagnosis not present

## 2024-08-10 DIAGNOSIS — Z0000 Encounter for general adult medical examination without abnormal findings: Secondary | ICD-10-CM | POA: Diagnosis not present

## 2024-08-10 MED ORDER — INDAPAMIDE 2.5 MG PO TABS
2.5000 mg | ORAL_TABLET | Freq: Every day | ORAL | 3 refills | Status: AC
  Filled 2024-08-10 – 2024-10-01 (×2): qty 90, 90d supply, fill #0

## 2024-08-10 MED ORDER — VENLAFAXINE HCL ER 150 MG PO CP24
150.0000 mg | ORAL_CAPSULE | Freq: Every day | ORAL | 3 refills | Status: AC
  Filled 2024-08-10: qty 90, 90d supply, fill #0

## 2024-08-10 MED ORDER — PANTOPRAZOLE SODIUM 40 MG PO TBEC
40.0000 mg | DELAYED_RELEASE_TABLET | Freq: Every day | ORAL | 3 refills | Status: AC | PRN
  Filled 2024-08-10: qty 90, 90d supply, fill #0

## 2024-08-10 MED ORDER — ARIPIPRAZOLE 2 MG PO TABS
2.0000 mg | ORAL_TABLET | Freq: Every day | ORAL | 11 refills | Status: AC
  Filled 2024-08-10 – 2024-09-04 (×2): qty 30, 30d supply, fill #0
  Filled 2024-09-06: qty 90, 90d supply, fill #0

## 2024-08-10 MED ORDER — CYANOCOBALAMIN 1000 MCG/ML IJ SOLN
1000.0000 ug | INTRAMUSCULAR | 2 refills | Status: AC
  Filled 2024-08-10 – 2024-10-01 (×2): qty 6, 84d supply, fill #0

## 2024-08-10 MED ORDER — AMLODIPINE BESYLATE 5 MG PO TABS
5.0000 mg | ORAL_TABLET | Freq: Every day | ORAL | 3 refills | Status: AC
  Filled 2024-08-10: qty 90, 90d supply, fill #0

## 2024-08-10 MED ORDER — LINZESS 290 MCG PO CAPS
290.0000 ug | ORAL_CAPSULE | Freq: Every day | ORAL | 3 refills | Status: AC
  Filled 2024-08-10: qty 90, 90d supply, fill #0

## 2024-08-10 MED ORDER — ALPRAZOLAM 1 MG PO TABS
1.0000 mg | ORAL_TABLET | Freq: Every day | ORAL | 1 refills | Status: AC
  Filled 2024-10-01 – 2024-10-04 (×3): qty 90, 90d supply, fill #0

## 2024-08-11 ENCOUNTER — Encounter: Payer: Self-pay | Admitting: Oncology

## 2024-08-12 ENCOUNTER — Inpatient Hospital Stay

## 2024-08-17 ENCOUNTER — Ambulatory Visit

## 2024-08-17 ENCOUNTER — Ambulatory Visit: Admitting: Oncology

## 2024-08-30 ENCOUNTER — Other Ambulatory Visit: Payer: Self-pay

## 2024-08-30 ENCOUNTER — Encounter: Payer: Self-pay | Admitting: Oncology

## 2024-08-30 MED ORDER — SILDENAFIL CITRATE 50 MG PO TABS
50.0000 mg | ORAL_TABLET | ORAL | 0 refills | Status: AC | PRN
  Filled 2024-08-30: qty 30, 90d supply, fill #0

## 2024-09-01 ENCOUNTER — Other Ambulatory Visit: Payer: Self-pay

## 2024-09-01 ENCOUNTER — Encounter: Payer: Self-pay | Admitting: Pharmacist

## 2024-09-04 ENCOUNTER — Other Ambulatory Visit: Payer: Self-pay

## 2024-09-06 ENCOUNTER — Other Ambulatory Visit: Payer: Self-pay

## 2024-10-01 ENCOUNTER — Other Ambulatory Visit (HOSPITAL_COMMUNITY): Payer: Self-pay

## 2024-10-01 ENCOUNTER — Other Ambulatory Visit: Payer: Self-pay

## 2024-10-02 ENCOUNTER — Other Ambulatory Visit: Payer: Self-pay

## 2024-10-02 ENCOUNTER — Other Ambulatory Visit (HOSPITAL_COMMUNITY): Payer: Self-pay

## 2024-10-03 ENCOUNTER — Other Ambulatory Visit: Payer: Self-pay

## 2024-10-03 ENCOUNTER — Encounter: Payer: Self-pay | Admitting: Oncology

## 2024-10-03 ENCOUNTER — Other Ambulatory Visit (HOSPITAL_COMMUNITY): Payer: Self-pay

## 2024-10-03 MED ORDER — VANISHPOINT TUBERCULIN SYRINGE 25G X 1" 1 ML MISC
0 refills | Status: AC
  Filled 2024-10-03 – 2024-10-06 (×2): qty 12, 30d supply, fill #0

## 2024-10-03 MED ORDER — ZOLPIDEM TARTRATE ER 12.5 MG PO TBCR
12.5000 mg | EXTENDED_RELEASE_TABLET | Freq: Every evening | ORAL | 1 refills | Status: AC | PRN
  Filled 2024-10-04 – 2024-10-05 (×2): qty 90, 90d supply, fill #0

## 2024-10-04 ENCOUNTER — Other Ambulatory Visit: Payer: Self-pay

## 2024-10-05 ENCOUNTER — Other Ambulatory Visit: Payer: Self-pay

## 2024-10-06 ENCOUNTER — Encounter: Payer: Self-pay | Admitting: Oncology

## 2024-10-06 ENCOUNTER — Other Ambulatory Visit: Payer: Self-pay

## 2024-10-07 ENCOUNTER — Other Ambulatory Visit: Payer: Self-pay

## 2024-10-07 ENCOUNTER — Encounter: Payer: Self-pay | Admitting: Oncology

## 2025-02-10 ENCOUNTER — Inpatient Hospital Stay

## 2025-02-15 ENCOUNTER — Inpatient Hospital Stay: Admitting: Oncology

## 2025-02-15 ENCOUNTER — Inpatient Hospital Stay
